# Patient Record
Sex: Male | Born: 1950
Health system: Southern US, Community
[De-identification: ages and names within clinical notes are randomized; demographics above are authoritative.]

## PROBLEM LIST (undated history)

## (undated) DIAGNOSIS — I444 Left anterior fascicular block: Secondary | ICD-10-CM

## (undated) DIAGNOSIS — H8110 Benign paroxysmal vertigo, unspecified ear: Secondary | ICD-10-CM

## (undated) DIAGNOSIS — Z85820 Personal history of malignant melanoma of skin: Secondary | ICD-10-CM

## (undated) DIAGNOSIS — M1990 Unspecified osteoarthritis, unspecified site: Secondary | ICD-10-CM

## (undated) DIAGNOSIS — Z87442 Personal history of urinary calculi: Secondary | ICD-10-CM

## (undated) DIAGNOSIS — E785 Hyperlipidemia, unspecified: Secondary | ICD-10-CM

## (undated) DIAGNOSIS — R7303 Prediabetes: Secondary | ICD-10-CM

## (undated) DIAGNOSIS — I10 Essential (primary) hypertension: Secondary | ICD-10-CM

## (undated) DIAGNOSIS — E119 Type 2 diabetes mellitus without complications: Secondary | ICD-10-CM

## (undated) DIAGNOSIS — K219 Gastro-esophageal reflux disease without esophagitis: Secondary | ICD-10-CM

## (undated) DIAGNOSIS — Z9889 Other specified postprocedural states: Secondary | ICD-10-CM

## (undated) DIAGNOSIS — N4232 Atypical small acinar proliferation of prostate: Secondary | ICD-10-CM

## (undated) DIAGNOSIS — Z8719 Personal history of other diseases of the digestive system: Secondary | ICD-10-CM

## (undated) DIAGNOSIS — R972 Elevated prostate specific antigen [PSA]: Secondary | ICD-10-CM

## (undated) DIAGNOSIS — Z85828 Personal history of other malignant neoplasm of skin: Secondary | ICD-10-CM

## (undated) HISTORY — DX: Essential (primary) hypertension: I10

## (undated) HISTORY — PX: LUMBAR DISC SURGERY: SHX700

---

## 1969-05-31 HISTORY — PX: GASTRIC FUNDOPLICATION: SHX226

## 1998-09-17 ENCOUNTER — Emergency Department (HOSPITAL_COMMUNITY): Admission: EM | Admit: 1998-09-17 | Discharge: 1998-09-17 | Payer: Self-pay | Admitting: Emergency Medicine

## 1998-09-17 ENCOUNTER — Encounter: Payer: Self-pay | Admitting: Emergency Medicine

## 1998-09-28 ENCOUNTER — Ambulatory Visit (HOSPITAL_COMMUNITY): Admission: RE | Admit: 1998-09-28 | Discharge: 1998-09-28 | Payer: Self-pay | Admitting: Gastroenterology

## 1998-09-28 ENCOUNTER — Encounter: Payer: Self-pay | Admitting: Gastroenterology

## 1998-11-30 ENCOUNTER — Ambulatory Visit (HOSPITAL_COMMUNITY): Admission: RE | Admit: 1998-11-30 | Discharge: 1998-11-30 | Payer: Self-pay

## 1999-06-14 ENCOUNTER — Encounter: Payer: Self-pay | Admitting: Family Medicine

## 1999-06-14 ENCOUNTER — Ambulatory Visit (HOSPITAL_COMMUNITY): Admission: RE | Admit: 1999-06-14 | Discharge: 1999-06-14 | Payer: Self-pay | Admitting: Family Medicine

## 1999-10-05 ENCOUNTER — Emergency Department (HOSPITAL_COMMUNITY): Admission: EM | Admit: 1999-10-05 | Discharge: 1999-10-05 | Payer: Self-pay | Admitting: *Deleted

## 1999-10-05 ENCOUNTER — Encounter: Payer: Self-pay | Admitting: *Deleted

## 2000-05-18 ENCOUNTER — Emergency Department (HOSPITAL_COMMUNITY): Admission: EM | Admit: 2000-05-18 | Discharge: 2000-05-18 | Payer: Self-pay | Admitting: Emergency Medicine

## 2000-05-18 ENCOUNTER — Encounter: Payer: Self-pay | Admitting: Emergency Medicine

## 2000-07-09 ENCOUNTER — Emergency Department (HOSPITAL_COMMUNITY): Admission: EM | Admit: 2000-07-09 | Discharge: 2000-07-10 | Payer: Self-pay | Admitting: Emergency Medicine

## 2000-07-09 ENCOUNTER — Encounter: Payer: Self-pay | Admitting: Emergency Medicine

## 2000-07-23 ENCOUNTER — Ambulatory Visit (HOSPITAL_COMMUNITY): Admission: RE | Admit: 2000-07-23 | Discharge: 2000-07-23 | Payer: Self-pay | Admitting: Gastroenterology

## 2000-07-23 ENCOUNTER — Encounter (INDEPENDENT_AMBULATORY_CARE_PROVIDER_SITE_OTHER): Payer: Self-pay | Admitting: Specialist

## 2000-08-18 ENCOUNTER — Encounter: Payer: Self-pay | Admitting: Surgery

## 2000-08-18 ENCOUNTER — Ambulatory Visit (HOSPITAL_COMMUNITY): Admission: RE | Admit: 2000-08-18 | Discharge: 2000-08-18 | Payer: Self-pay | Admitting: Surgery

## 2001-05-28 ENCOUNTER — Other Ambulatory Visit: Admission: RE | Admit: 2001-05-28 | Discharge: 2001-05-28 | Payer: Self-pay | Admitting: Dermatology

## 2002-10-27 ENCOUNTER — Ambulatory Visit (HOSPITAL_COMMUNITY): Admission: RE | Admit: 2002-10-27 | Discharge: 2002-10-27 | Payer: Self-pay | Admitting: Gastroenterology

## 2003-06-20 ENCOUNTER — Ambulatory Visit (HOSPITAL_COMMUNITY): Admission: RE | Admit: 2003-06-20 | Discharge: 2003-06-20 | Payer: Self-pay | Admitting: Family Medicine

## 2003-06-20 ENCOUNTER — Encounter: Payer: Self-pay | Admitting: Family Medicine

## 2005-04-15 ENCOUNTER — Other Ambulatory Visit: Admission: RE | Admit: 2005-04-15 | Discharge: 2005-04-15 | Payer: Self-pay | Admitting: Dermatology

## 2005-07-25 ENCOUNTER — Emergency Department (HOSPITAL_COMMUNITY): Admission: EM | Admit: 2005-07-25 | Discharge: 2005-07-25 | Payer: Self-pay | Admitting: Emergency Medicine

## 2006-09-12 ENCOUNTER — Emergency Department (HOSPITAL_COMMUNITY): Admission: EM | Admit: 2006-09-12 | Discharge: 2006-09-12 | Payer: Self-pay | Admitting: Emergency Medicine

## 2006-09-13 ENCOUNTER — Emergency Department (HOSPITAL_COMMUNITY): Admission: EM | Admit: 2006-09-13 | Discharge: 2006-09-13 | Payer: Self-pay | Admitting: Emergency Medicine

## 2007-02-09 ENCOUNTER — Ambulatory Visit: Payer: Self-pay | Admitting: Vascular Surgery

## 2007-03-23 ENCOUNTER — Encounter: Admission: RE | Admit: 2007-03-23 | Discharge: 2007-06-21 | Payer: Self-pay | Admitting: Neurology

## 2008-08-29 ENCOUNTER — Inpatient Hospital Stay (HOSPITAL_COMMUNITY): Admission: EM | Admit: 2008-08-29 | Discharge: 2008-08-31 | Payer: Self-pay | Admitting: Emergency Medicine

## 2008-08-29 ENCOUNTER — Ambulatory Visit (HOSPITAL_COMMUNITY): Admission: RE | Admit: 2008-08-29 | Discharge: 2008-08-29 | Payer: Self-pay | Admitting: Family Medicine

## 2009-12-11 ENCOUNTER — Emergency Department (HOSPITAL_COMMUNITY): Admission: EM | Admit: 2009-12-11 | Discharge: 2009-12-11 | Payer: Self-pay | Admitting: Emergency Medicine

## 2010-11-21 LAB — FECAL OCCULT BLOOD, GUAIAC: Fecal Occult Blood: NEGATIVE

## 2010-12-29 ENCOUNTER — Encounter: Payer: Self-pay | Admitting: Family Medicine

## 2010-12-29 DIAGNOSIS — E291 Testicular hypofunction: Secondary | ICD-10-CM | POA: Insufficient documentation

## 2010-12-29 DIAGNOSIS — I10 Essential (primary) hypertension: Secondary | ICD-10-CM | POA: Insufficient documentation

## 2010-12-29 DIAGNOSIS — E785 Hyperlipidemia, unspecified: Secondary | ICD-10-CM

## 2010-12-29 DIAGNOSIS — IMO0001 Reserved for inherently not codable concepts without codable children: Secondary | ICD-10-CM | POA: Insufficient documentation

## 2010-12-29 DIAGNOSIS — R42 Dizziness and giddiness: Secondary | ICD-10-CM | POA: Insufficient documentation

## 2010-12-29 DIAGNOSIS — E782 Mixed hyperlipidemia: Secondary | ICD-10-CM | POA: Insufficient documentation

## 2011-02-12 NOTE — Consult Note (Signed)
NAMEBENCE, TRAPP                 ACCOUNT NO.:  [id number]   MEDICAL RECORD NO.:  [id number]          PATIENT TYPE:  INP   LOCATION:  5120                         FACILITY:  [facility]   PHYSICIAN:  Cherylynn Ridges, M.D.    DATE OF BIRTH:  [birth year]   DATE OF CONSULTATION:  08/30/2008  DATE OF DISCHARGE:                                 CONSULTATION   Dear [name] primary care physician:   Thank you for asking me to see [name].  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  He has a history of a hiatal  hernia repair 30 years ago.  He has been unable to vomit since that  time, but he did have severe nausea associated with pain and tenderness  in the right upper quadrant all of which is resolved currently.  The  patient had an NG tube placed on admission, which drained a little-to-no  fluid, but most of his pain resulting before the NG tube was placed with  passing a multiple amounts of flatus.   A CT scan was done on admission, which shows some dilated small bowel  loops, possible transition zone near the terminal ileum.  No definitive  obstruction was noted.  A partial obstruction was called.  Surgical  consultation was requested.   His past medical history is significant for:  1. Hypertension.  2. Hyperlipidemia.  3. History of renal calculi.   PAST SURGICAL HISTORY:  He has had a hiatal hernia repair before in the  past.  He has no history of coronary artery disease, diabetes, pulmonary  disease, liver disease, or renal dysfunction.  He has no thyroid  dysfunction that we know of.   Family history is noncontributory.   Current medications include Nexium, Norvasc, Toprol-XL, fish oil,  Crestor, aspirin, Benicar, hydrochlorothiazide.   ALLERGIES:  He has no known drug allergies.   PHYSICAL EXAMINATION:  GENERAL:   He is afebrile.  He looks uncomfortable  with the NG tube in place, which is not draining anything; but  otherwise, he is not in any distress.  VITAL SIGNS:  His pulse is 75, blood pressure 131/83.  HEENT:  He is normocephalic and atraumatic and anicteric.  NECK:  Supple.  No carotid bruits.  No palpable cervical adenopathy.  CHEST:  Clear to auscultation.  CARDIAC:  Regular rhythm and rate, may be a slight murmur at the left  lower sternal border, but this may be just a functional murmur.  ABDOMEN:  Soft and very hairy.  He has a midline incision from his old  previous hiatal hernia repair 30 years ago.  No areas of tenderness,  actually excellent bowel sounds which appear to be normal.  RECTAL:  Not performed.  NEUROLOGICAL:  Cranial nerves II through XII are grossly intact.  Mental  status wise, he is intact with normal mentation, good judgment.  He does  not appear to be in distress.  LABORATORY STUDIES:  His white count is normal, hemoglobin of 14.7,  potassium is 3.3.  LFTs were all normal.   IMPRESSION:  Because the patient did have some right upper quadrant  tenderness along with epigastric tenderness, it is possibility that he  does have gallbladder disease, and by history, he has not had his  appendix nor his gallbladder removed.   The possibility of partial small bowel obstruction does exist; however,  it is very ethical.  Based on the CT scan and based on the fact that he  has had minimal-to-no NG tube output and he has been passing flatus, I  will discontinue the NG tube.  In addition to that, I think I will get  an ultrasound of the abdomen just to rule out gallstones that is one of  the possibilities for his current pain.  The patient did state he has a  strong right upper quadrant tenderness initially but that has resolved.  So the plan is discontinue NG tube, let him have ice chips, get him an  abdominal ultrasound and if that is negative, then start the patient  on  clear liquids and hopefully advance him quickly and discharge him in 24-  48 hours.      Cherylynn Ridges, M.D.  Electronically Signed     JOW/MEDQ  D:  08/30/2008  T:  08/30/2008  Job:  [id number]   cc:   [facility]

## 2011-02-12 NOTE — H&P (Signed)
[name], [name]                 ACCOUNT NO.:  [id number]   MEDICAL RECORD NO.:  [id number]          PATIENT TYPE:  INP   LOCATION:  5120                         FACILITY:  [facility]   PHYSICIAN:  Ladell Pier, M.D.   DATE OF BIRTH:  1951-01-23   DATE OF ADMISSION:  08/29/2008  DATE OF DISCHARGE:                              HISTORY & PHYSICAL   CHIEF COMPLAINT:  Abdominal pain and nausea   HISTORY OF PRESENT ILLNESS:  The patient is a 60 year old white male  that presented to the emergency room complaining of abdominal pain since  4:00 a.m. Sunday night.  The patient stated that he was not able to go  to work today because the pain was so severe with nausea.  He did not  have any vomiting, but he has not thrown up in a year since he has had  surgery for hiatal hernia.  He recently saw Dr. Christell Constant and had a checkup  and everything was okay.  He states that he has had small bowel  obstruction about 8-10 years ago that resolved on its own.  He has no  other complaints other than the abdominal pain and nausea.  He states  that this has eased off some now since he has been in the emergency room  and has the NG tube.   PAST MEDICAL HISTORY:  1. Dyslipidemia.  2. GERD.  3. Hiatal hernia.  4. Hypertension.  5. History of kidney stone.  6. History of back surgery x3.  7. Small bowel obstruction 8-10 years ago.   FAMILY HISTORY:  Noncontributory.   SOCIAL HISTORY:  He does not smoke.  He drinks alcohol socially.  He is  married.  He has 2 children.  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   MEDICATIONS:  1. Nexium 40 mg daily.  2. Norvasc 5 mg daily.  3. Toprol XL 100 mg daily.  4. Crestor 10 mg daily.  5. Fish oil 1000 daily.  6. Vitamin D 1000 international units daily.  7. Aspirin 81 mg daily.  8. Benicar HCTZ daily.   ALLERGIES:  PENICILLIN, MORPHINE.   REVIEW OF SYSTEMS:  Negative, otherwise stated in the HPI.   PHYSICAL EXAMINATION:  Temperature 98, blood  pressure 157/92, pulse of  81, respirations 20, pulse oximetry 94% on room air.  GENERAL: Patient is sitting up on the stretcher; does not seem to be in  any acute distress.  HEENT: Head is normocephalic, atraumatic.  Pupils equal, round and  reactive to light.  Throat without erythema.  CARDIOVASCULAR:  Regular rate and rhythm.  LUNGS:  Clear bilaterally.  ABDOMEN: Soft, nontender; with some tinkling bowel sounds.  EXTREMITIES: Without edema.   LABS:  Urinalysis negative.  Sodium 137, potassium 3.3, chloride 99, CO2  29, glucose 124, BUN 15, creatinine 0.85.  Alkaline phosphatase 95, AST  20, ALT 25.  WBC 9.7, hemoglobin 14.7, platelets 192, MCV 91.6.   CT scan of the abdomen and pelvis with partial small bowel obstruction,  severe degenerative changes throughout the visualized lower thoracic and  lumbar; spinal  stenoses suspected at L3-L4, L4-L5.  No mass identified.  This presumably is secondary to an adhesion.  Moderate prostate  enlargement, particularly at the median lobe.   ASSESSMENT/PLAN:  1. Partial small bowel obstruction.  Question secondary to adhesions.  2. GERD.  3. Dyslipidemia.  4. Hypertension.  5. Hypokalemia.   Will admit the patient to the hospital, start him on IV fluids.  He is  n.p.o. NG tube to oral suction.  General surgery consulted.  Will  continue him on his oral medications      Ladell Pier, M.D.  Electronically Signed     NJ/MEDQ  D:  08/29/2008  T:  08/29/2008  Job:  [id number]   cc:   Dr. Christell Constant

## 2011-02-15 NOTE — Procedures (Signed)
[facility]  Patient:    [name], [name]                        MRN: [id number] Proc. Date: 07/23/00 Adm. Date:  [id number] Disc. Date: [id number] Attending:  Osvaldo Human CC:         Monica Becton, M.D.  Cecil Cranker, M.D. [facility]   Procedure Report  PROCEDURE:  Esophagogastroduodenoscopy with biopsy.  INDICATIONS FOR PROCEDURE:  A patient with increasing reflux status post hiatal hernia repair requesting EGD per Dr. Christell Constant who discussed the case with Dr. Randa Evens in my absence. To me it sounds more like his old recurrence of his gas and bloat and not so much of the reflux. Consent was signed risks, benefits, methods, and options were thoroughly discussed with [name] multiple times in the past.  MEDICINES USED:  Demerol 70, Versed 6.  DESCRIPTION OF PROCEDURE:  Video endoscope was inserted by direct vision. A quick look at the vocal cords and posterior pharynx were normal. The scope was inserted through a normal esophagus into the stomach. The previous hiatal hernia wrap was confirmed. The scope was advanced through the antrum pertinent for some mild antritis and some tiny antral erosions and through a normal pylorus into a normal duodenal bulb and around the C loop to a normal second portion of the duodenum. The scope was withdrawn back to the bulb and a good look their ruled out ulcers in that location. The scope was withdrawn back to the stomach retroflexed. The angularis was normal. High in the cardia, the previous surgical changes were confirmed.  The fundus, lesser and greater curve were normal in retroflexed visualization. The scope was straightened and straight visualization of the stomach confirmed the antritis and ruled out any other abnormalities. The scope was advanced to the antrum, multiple biopsies were obtained and a few biopsies in the proximal stomach were also obtained to rule out any microscopic abnormality like  Helicobacter. Air was suctioned. Again a good look at the esophagus on slow withdrawal confirmed its normal appearance without any signs of Barretts or esophagitis. The scope was removed. The patient tolerated the procedure well and there was no obvious or immediate complication.  ENDOSCOPIC DIAGNOSIS: 1. Status post hiatal hernia wrap surgery. 2. Antritis and antral erosions status post biopsy. 3. Otherwise normal esophagogastroduodenoscopy.  PLAN:  Await pathology, treat H. pylori if positive. Trial of antispasmodics. Will try him on Robinul. Will need to get his records from [facility] ER to include x-rays and labs as well as Dr. Buel Ream recent labs and otherwise follow-up in 6-8 weeks to recheck symptoms and decide any other workup and plans. He is due for a colonoscopy next year, we may need to do that sooner or continue workup with things like gastric emptying, repeat ultrasound, small bowel follow-through, CT, etc. DD:  07/23/00 TD:  07/23/00 Job: [id number]

## 2011-02-15 NOTE — Op Note (Signed)
   NAME:  [name], [name]                           ACCOUNT NO.:  [id number]   MEDICAL RECORD NO.:  [id number]                   PATIENT TYPE:  AMB   LOCATION:  ENDO                                 FACILITY:  [facility]   PHYSICIAN:  Petra Kuba, M.D.                 DATE OF BIRTH:  [birth year]   DATE OF PROCEDURE:  10/27/2002  DATE OF DISCHARGE:                                 OPERATIVE REPORT   PROCEDURE PERFORMED:  Colonoscopy.   INDICATIONS FOR PROCEDURE:  Family history of colon cancer.  Personal  history of colon polyps due for repeat screening.   Consent was signed after the risks, benefits, and operative options were  thoroughly discussed in the office.   MEDICINES USED:  Demerol 100, Versed 10.   DESCRIPTION OF PROCEDURE:  Rectal inspection was pertinent for external  hemorrhoids.  Small digital exam was negative.  The video colonoscope was  inserted, easily advanced around the colon to the cecum.  This did require  rolling him on his back and some abdominal pressure.  On insertion, no  abnormalities were seen.  The cecum was identified by the appendiceal  orifice and the ileocecal valve.  The scope was slowly withdrawn.  The prep  was adequate.  There was some liquid stool that required washing and suction  on slow withdrawal through the colon.  There were no polyps, lesions, masses  or diverticula seen as we slowly withdrew back to the rectum.  Once back in  the rectum, the scope was retroflexed, pertinent for some small internal  hemorrhoids.  The scope was straightened and re-advanced a ways up the left  side of the colon.  The air was suctioned, the scope removed.  The patient  tolerated the procedure well.  There were no obvious immediate  complications.   DIAGNOSIS:  1. Internal and external hemorrhoids.  2. Otherwise normal mucosa to the cecum.   PLAN:  I would be happy to see him back p.r.n. otherwise return to the care  of Dr. Christell Constant for further continuing coverage  his health care management to  include yearly rectals and guaiacs and otherwise repeat screening in five  years.                                                Petra Kuba, M.D.    MEM/MEDQ  D:  10/27/2002  T:  10/27/2002  Job:  [id number]   cc:   Ernestina Penna, M.D.  [address] [place]  [place] [address]  Fax: [phone]

## 2011-07-02 LAB — URINALYSIS, ROUTINE W REFLEX MICROSCOPIC
Bilirubin Urine: NEGATIVE
Glucose, UA: NEGATIVE mg/dL
Hgb urine dipstick: NEGATIVE
Ketones, ur: 15 mg/dL — AB
Nitrite: NEGATIVE
Protein, ur: NEGATIVE mg/dL
Specific Gravity, Urine: >1.046 — ABNORMAL HIGH (ref 1.005–1.030)
Urobilinogen, UA: 0.2 mg/dL (ref 0.0–1.0)
pH: 6.5 (ref 5.0–8.0)

## 2011-07-02 LAB — COMPREHENSIVE METABOLIC PANEL
ALT: 25 U/L (ref 0–53)
AST: 20 U/L (ref 0–37)
Albumin: 3.7 g/dL (ref 3.5–5.2)
Alkaline Phosphatase: 95 U/L (ref 39–117)
BUN: 15 mg/dL (ref 6–23)
CO2: 29 mEq/L (ref 19–32)
Calcium: 9.6 mg/dL (ref 8.4–10.5)
Chloride: 99 mEq/L (ref 96–112)
Creatinine, Ser: 0.85 mg/dL (ref 0.4–1.5)
GFR calc Af Amer: >60 mL/min (ref >60)
GFR calc non Af Amer: >60 mL/min (ref >60)
Glucose, Bld: 124 mg/dL — ABNORMAL HIGH (ref 70–99)
Potassium: 3.3 mEq/L — ABNORMAL LOW (ref 3.5–5.1)
Sodium: 137 mEq/L (ref 135–145)
Total Bilirubin: 1 mg/dL (ref 0.3–1.2)
Total Protein: 7.1 g/dL (ref 6.0–8.3)

## 2011-07-02 LAB — DIFFERENTIAL
Basophils Absolute: 0 10*3/uL (ref 0.0–0.1)
Basophils Relative: 0 % (ref 0–1)
Eosinophils Absolute: 0 10*3/uL (ref 0.0–0.7)
Eosinophils Relative: 0 % (ref 0–5)
Lymphocytes Relative: 12 % (ref 12–46)
Lymphs Abs: 1.2 10*3/uL (ref 0.7–4.0)
Monocytes Absolute: 0.4 10*3/uL (ref 0.1–1.0)
Monocytes Relative: 4 % (ref 3–12)
Neutro Abs: 8.1 10*3/uL — ABNORMAL HIGH (ref 1.7–7.7)
Neutrophils Relative %: 83 % — ABNORMAL HIGH (ref 43–77)

## 2011-07-02 LAB — CBC
HCT: 43.6 % (ref 39.0–52.0)
Hemoglobin: 14.7 g/dL (ref 13.0–17.0)
MCHC: 33.7 g/dL (ref 30.0–36.0)
MCV: 91.6 fL (ref 78.0–100.0)
Platelets: 192 10*3/uL (ref 150–400)
RBC: 4.76 MIL/uL (ref 4.22–5.81)
RDW: 13.2 % (ref 11.5–15.5)
WBC: 9.7 10*3/uL (ref 4.0–10.5)

## 2011-07-05 LAB — BASIC METABOLIC PANEL
BUN: 13 mg/dL (ref 6–23)
CO2: 28 mEq/L (ref 19–32)
Calcium: 9.3 mg/dL (ref 8.4–10.5)
Chloride: 102 mEq/L (ref 96–112)
Creatinine, Ser: 0.86 mg/dL (ref 0.4–1.5)
GFR calc Af Amer: >60 mL/min (ref >60)
GFR calc non Af Amer: >60 mL/min (ref >60)
Glucose, Bld: 116 mg/dL — ABNORMAL HIGH (ref 70–99)
Potassium: 3.5 mEq/L (ref 3.5–5.1)
Sodium: 138 mEq/L (ref 135–145)

## 2011-07-05 LAB — CBC
HCT: 42 % (ref 39.0–52.0)
Hemoglobin: 14.5 g/dL (ref 13.0–17.0)
MCHC: 34.5 g/dL (ref 30.0–36.0)
MCV: 90.8 fL (ref 78.0–100.0)
Platelets: 169 10*3/uL (ref 150–400)
RBC: 4.62 MIL/uL (ref 4.22–5.81)
RDW: 13.1 % (ref 11.5–15.5)
WBC: 8.1 10*3/uL (ref 4.0–10.5)

## 2012-03-22 ENCOUNTER — Encounter (HOSPITAL_COMMUNITY): Payer: Self-pay | Admitting: Emergency Medicine

## 2012-03-22 ENCOUNTER — Inpatient Hospital Stay (HOSPITAL_COMMUNITY)
Admission: EM | Admit: 2012-03-22 | Discharge: 2012-03-24 | DRG: 180 | Disposition: A | Discharge status: Home / Self Care | Payer: BC Managed Care – PPO | Source: Ambulatory Visit | Attending: Internal Medicine | Admitting: Internal Medicine

## 2012-03-22 ENCOUNTER — Emergency Department (HOSPITAL_COMMUNITY): Payer: BC Managed Care – PPO

## 2012-03-22 DIAGNOSIS — E782 Mixed hyperlipidemia: Secondary | ICD-10-CM | POA: Diagnosis present

## 2012-03-22 DIAGNOSIS — Z7982 Long term (current) use of aspirin: Secondary | ICD-10-CM

## 2012-03-22 DIAGNOSIS — K219 Gastro-esophageal reflux disease without esophagitis: Secondary | ICD-10-CM | POA: Diagnosis present

## 2012-03-22 DIAGNOSIS — R42 Dizziness and giddiness: Secondary | ICD-10-CM | POA: Diagnosis present

## 2012-03-22 DIAGNOSIS — E876 Hypokalemia: Secondary | ICD-10-CM | POA: Diagnosis present

## 2012-03-22 DIAGNOSIS — K56609 Unspecified intestinal obstruction, unspecified as to partial versus complete obstruction: Secondary | ICD-10-CM

## 2012-03-22 DIAGNOSIS — K56600 Partial intestinal obstruction, unspecified as to cause: Secondary | ICD-10-CM | POA: Diagnosis present

## 2012-03-22 DIAGNOSIS — K565 Intestinal adhesions [bands], unspecified as to partial versus complete obstruction: Principal | ICD-10-CM | POA: Diagnosis present

## 2012-03-22 DIAGNOSIS — I10 Essential (primary) hypertension: Secondary | ICD-10-CM | POA: Diagnosis present

## 2012-03-22 DIAGNOSIS — E785 Hyperlipidemia, unspecified: Secondary | ICD-10-CM | POA: Diagnosis present

## 2012-03-22 DIAGNOSIS — Z79899 Other long term (current) drug therapy: Secondary | ICD-10-CM

## 2012-03-22 LAB — CBC
HCT: 44.3 % (ref 39.0–52.0)
Hemoglobin: 15.5 g/dL (ref 13.0–17.0)
MCH: 30.6 pg (ref 26.0–34.0)
MCV: 87.5 fL (ref 78.0–100.0)
Platelets: 190 10*3/uL (ref 150–400)
RBC: 5.06 MIL/uL (ref 4.22–5.81)
RDW: 13 % (ref 11.5–15.5)
WBC: 10 10*3/uL (ref 4.0–10.5)

## 2012-03-22 LAB — COMPREHENSIVE METABOLIC PANEL
ALT: 21 U/L (ref 0–53)
AST: 17 U/L (ref 0–37)
Albumin: 4.4 g/dL (ref 3.5–5.2)
Alkaline Phosphatase: 114 U/L (ref 39–117)
BUN: 20 mg/dL (ref 6–23)
CO2: 27 mEq/L (ref 19–32)
Chloride: 101 mEq/L (ref 96–112)
Creatinine, Ser: 0.92 mg/dL (ref 0.50–1.35)
GFR calc Af Amer: >90 mL/min (ref >90)
GFR calc non Af Amer: 89 mL/min — ABNORMAL LOW (ref >90)
Glucose, Bld: 107 mg/dL — ABNORMAL HIGH (ref 70–99)
Potassium: 3.4 mEq/L — ABNORMAL LOW (ref 3.5–5.1)
Total Bilirubin: 0.6 mg/dL (ref 0.3–1.2)

## 2012-03-22 LAB — LACTIC ACID, PLASMA: Lactic Acid, Venous: 1.2 mmol/L (ref 0.5–2.2)

## 2012-03-22 LAB — URINALYSIS, ROUTINE W REFLEX MICROSCOPIC
Bilirubin Urine: NEGATIVE
Glucose, UA: NEGATIVE mg/dL
Hgb urine dipstick: NEGATIVE
Specific Gravity, Urine: 1.027 (ref 1.005–1.030)
pH: 5.5 (ref 5.0–8.0)

## 2012-03-22 LAB — LIPASE, BLOOD: Lipase: 16 U/L (ref 11–59)

## 2012-03-22 LAB — TROPONIN I: Troponin I: <0.3 ng/mL (ref <0.30)

## 2012-03-22 MED ORDER — MORPHINE SULFATE 4 MG/ML IJ SOLN
4.0000 mg | Freq: Once | INTRAMUSCULAR | Status: AC
  Administered 2012-03-22: 4 mg via INTRAVENOUS
  Filled 2012-03-22: qty 1

## 2012-03-22 MED ORDER — ONDANSETRON HCL 4 MG/2ML IJ SOLN
4.0000 mg | Freq: Once | INTRAMUSCULAR | Status: AC
  Administered 2012-03-22: 4 mg via INTRAVENOUS
  Filled 2012-03-22: qty 2

## 2012-03-22 MED ORDER — IOHEXOL 300 MG/ML  SOLN
20.0000 mL | INTRAMUSCULAR | Status: AC
  Administered 2012-03-22: 20 mL via ORAL

## 2012-03-22 MED ORDER — SODIUM CHLORIDE 0.9 % IV BOLUS (SEPSIS)
500.0000 mL | Freq: Once | INTRAVENOUS | Status: AC
  Administered 2012-03-22: 500 mL via INTRAVENOUS

## 2012-03-22 MED ORDER — IOHEXOL 300 MG/ML  SOLN
100.0000 mL | Freq: Once | INTRAMUSCULAR | Status: AC | PRN
  Administered 2012-03-22: 100 mL via INTRAVENOUS

## 2012-03-22 NOTE — ED Notes (Addendum)
C/o constant abd pain and nausea since 2am.  Normal BM today.  States when he pushes on RUQ pain shoots across abd.

## 2012-03-22 NOTE — Consult Note (Signed)
Reason for Consult:[id number] Referring Physician: Dr Warden Fillers is an 61 y.o. male.  ZOX:WRUE is a 61 year old Caucasian male WHO DEVELOPED UPPER ABDOMINAL DISCOMFORT AROUND 2 AM. Initially the pain was intermittent however today the pain became more steady and constant. He describes the pain as like a pressure pushing out. He still had a large amount of flatus today. He has also been burping quite a lot as well. He reports having 2 normal bowel movements today. He is also had a little bit of nausea as well. He had a similar episode like this in November 2009. He was admitted to the hospital and treated non-operatively for a mild case of partial small bowel obstruction. He reports having a hiatal hernia repair more than 30 years ago. He denies any weight loss. He denies any melena, hematochezia, jaundice, NSAID use. He had a normal upper endoscopy and colonoscopy about 2 months ago. The pain is mainly in his upper abdomen and on the right side. He denies any fevers or chills. He states that he has not been able to vomit ever since his hiatal hernia repair.  Past Medical History  Diagnosis Date  . Essential hypertension, benign   . Other and unspecified hyperlipidemia   . Reflux   . Vertigo, intermittent   . Other testicular hypofunction   . Acid reflux     Past Surgical History  Procedure Date  . Hiatal hernia repair   . Lumbar disc surgery     No family history on file.  Social History:  reports that he has never smoked. He does not have any smokeless tobacco history on file. He reports that he drinks alcohol. He reports that he does not use illicit drugs.  Allergies:  Allergies  Allergen Reactions  . Penicillins     Medications: I have reviewed the patient's current medications.  Results for orders placed during the hospital encounter of 03/22/12 (from the past 48 hour(s))  CBC     Status: Normal   Collection Time   03/22/12  5:20 PM      Component Value Range Comment     WBC 10.0  4.0 - 10.5 K/uL    RBC 5.06  4.22 - 5.81 MIL/uL    Hemoglobin 15.5  13.0 - 17.0 g/dL    HCT 45.4  09.8 - 11.9 %    MCV 87.5  78.0 - 100.0 fL    MCH 30.6  26.0 - 34.0 pg    MCHC 35.0  30.0 - 36.0 g/dL    RDW 14.7  82.9 - 56.2 %    Platelets 190  150 - 400 K/uL   COMPREHENSIVE METABOLIC PANEL     Status: Abnormal   Collection Time   03/22/12  5:20 PM      Component Value Range Comment   Sodium 139  135 - 145 mEq/L    Potassium 3.4 (*) 3.5 - 5.1 mEq/L    Chloride 101  96 - 112 mEq/L    CO2 27  19 - 32 mEq/L    Glucose, Bld 107 (*) 70 - 99 mg/dL    BUN 20  6 - 23 mg/dL    Creatinine, Ser 1.30  0.50 - 1.35 mg/dL    Calcium 86.5  8.4 - 10.5 mg/dL    Total Protein 7.8  6.0 - 8.3 g/dL    Albumin 4.4  3.5 - 5.2 g/dL    AST 17  0 - 37 U/L    ALT 21  0 - 53 U/L    Alkaline Phosphatase 114  39 - 117 U/L    Total Bilirubin 0.6  0.3 - 1.2 mg/dL    GFR calc non Af Amer 89 (*) >90 mL/min    GFR calc Af Amer >90  >90 mL/min   LIPASE, BLOOD     Status: Normal   Collection Time   03/22/12  5:20 PM      Component Value Range Comment   Lipase 16  11 - 59 U/L   LACTIC ACID, PLASMA     Status: Normal   Collection Time   03/22/12  7:00 PM      Component Value Range Comment   Lactic Acid, Venous 1.2  0.5 - 2.2 mmol/L   URINALYSIS, ROUTINE W REFLEX MICROSCOPIC     Status: Abnormal   Collection Time   03/22/12  7:11 PM      Component Value Range Comment   Color, Urine YELLOW  YELLOW    APPearance CLEAR  CLEAR    Specific Gravity, Urine 1.027  1.005 - 1.030    pH 5.5  5.0 - 8.0    Glucose, UA NEGATIVE  NEGATIVE mg/dL    Hgb urine dipstick NEGATIVE  NEGATIVE    Bilirubin Urine NEGATIVE  NEGATIVE    Ketones, ur 15 (*) NEGATIVE mg/dL    Protein, ur NEGATIVE  NEGATIVE mg/dL    Urobilinogen, UA 0.2  0.0 - 1.0 mg/dL    Nitrite NEGATIVE  NEGATIVE    Leukocytes, UA NEGATIVE  NEGATIVE MICROSCOPIC NOT DONE ON URINES WITH NEGATIVE PROTEIN, BLOOD, LEUKOCYTES, NITRITE, OR GLUCOSE <1000 mg/dL.   TROPONIN I     Status: Normal   Collection Time   03/22/12  7:50 PM      Component Value Range Comment   Troponin I <0.30  <0.30 ng/mL     Ct Abdomen Pelvis W Contrast  03/22/2012  *RADIOLOGY REPORT*  Clinical Data: Abdominal pain.  CT ABDOMEN AND PELVIS WITH CONTRAST  Technique:  Multidetector CT imaging of the abdomen and pelvis was performed following the standard protocol during bolus administration of intravenous contrast.  Contrast: OMNIPAQUE IOHEXOL 300 MG/ML  SOLN  Comparison: 08/29/2008.  Findings: The lung bases are clear.  Minimal dependent bibasilar atelectasis.  The liver is unremarkable.  No focal lesions or intrahepatic biliary dilatation.  The gallbladder is normal.  No common bile duct dilatation.  The pancreas is normal.  The spleen is normal in size.  No focal lesions.  The adrenal glands and kidneys are normal except for small bilateral renal calculi.  The stomach is unremarkable.  The duodenum is normal.  There are dilated small bowel loops particularly in the pelvis which are fluid filled with scattered air fluid levels.  The terminal ileum and distal ileum are normal in caliber.  There is mild enhancement and wall thickening in the distal loop of ileum which could reflect local inflammation or the sequela of an adhesion.  No mass.  No free air or free fluid.  There is a small amount of free fluid in the pelvis.  The aorta is normal in caliber.  Minimal scattered atherosclerotic changes.  The major branch vessels are patent.  No mesenteric or retroperitoneal mass or adenopathy.  The prostate gland is enlarged.  The bladder is normal.  No pelvic mass or adenopathy.  There is a small amount of free pelvic fluid. No inguinal mass or adenopathy.  Small right inguinal hernia containing fat.  The bony  structures are unremarkable.  There is a stable sclerotic lesion in the right iliac bone.  IMPRESSION:  1.  Findings consistent with an early small bowel obstruction either due to local  small bowel inflammation or adhesion in the distal ileum. 2.  No other significant abdominal/pelvic findings.  Original Report Authenticated By: P. Loralie Champagne, M.D.    Review of Systems  Constitutional: Negative for fever, chills and diaphoresis.  HENT: Negative for nosebleeds and congestion.   Eyes: Negative for blurred vision and double vision.  Respiratory: Negative for shortness of breath.   Cardiovascular: Negative for chest pain, palpitations, orthopnea, leg swelling and PND.  Gastrointestinal: Positive for nausea and abdominal pain. Negative for vomiting, diarrhea, constipation, blood in stool and melena.       Recent normal EGD and colonoscopy in 12/2011  Genitourinary: Negative for dysuria and urgency.  Musculoskeletal: Negative for myalgias.  Neurological: Negative for dizziness, focal weakness, seizures, loss of consciousness, weakness and headaches.       Denies TIA/amarosis fugax  Psychiatric/Behavioral: Negative for substance abuse.   Blood pressure 134/83, pulse 66, temperature 97.8 F (36.6 C), temperature source Oral, resp. rate 19, SpO2 96.00%. Physical Exam  Vitals reviewed. Constitutional: He is oriented to person, place, and time. He appears well-developed and well-nourished. No distress.  HENT:  Head: Normocephalic and atraumatic.  Right Ear: External ear normal.  Left Ear: External ear normal.  Eyes: Conjunctivae are normal. No scleral icterus.  Neck: Normal range of motion. Neck supple. No tracheal deviation present. No thyromegaly present.  Cardiovascular: Normal rate, regular rhythm, normal heart sounds and intact distal pulses.   Respiratory: Effort normal and breath sounds normal. No stridor. No respiratory distress. He has no wheezes.  GI: Soft. Bowel sounds are normal. There is tenderness in the right upper quadrant, epigastric area and periumbilical area. There is no rigidity, no rebound and no guarding.         Obese; perhaps mild distension. No  tympany  Musculoskeletal: Normal range of motion. He exhibits no edema and no tenderness.  Neurological: He is alert and oriented to person, place, and time. He exhibits normal muscle tone.  Skin: Skin is warm and dry. No rash noted. He is not diaphoretic. No erythema.  Psychiatric: He has a normal mood and affect. His behavior is normal. Judgment and thought content normal.    Assessment/Plan: HTN HPL GERD hypokalemia Epigastric pain with mild partial small bowel obstruction  Clinically, his history and exam and CT scan is very similar to his presentation in November 2009. Most likely this is due to an adhesion. I believe the probability of this being inflammatory bowel disease is very low. He could have a mild case of enteritis causing a reactive ileus. The more than likely this is consistent with an adhesive band causing a partial blockage. At this time, I would recommend holding off on nasogastric tube placement. However if the patient complains of worsening pain or worsening nausea overnight or becomes more distended, then he needs to have a nasogastric tube placed for decompression  Recommend-bowel rest, n.p.o., IV fluid resuscitation, replacement of potassium. I would also repeat a set of plain abdominal films in the morning to monitor his bowel gas pattern.  Mary Sella. Andrey Campanile, MD, FACS General, Bariatric, & Minimally Invasive Surgery [facility]   We will follow in consultation.  Gaynelle Adu M 03/22/2012, 9:56 PM

## 2012-03-22 NOTE — ED Provider Notes (Signed)
History     CSN: [id number]  Arrival date & time 03/22/12  1648   First MD Initiated Contact with Patient 03/22/12 1825      Chief Complaint  Patient presents with  . Abdominal Pain     Patient is a 60 y.o. male presenting with abdominal pain. The history is provided by the patient.  Abdominal Pain The primary symptoms of the illness include abdominal pain and nausea. The primary symptoms of the illness do not include fever, shortness of breath, vomiting, diarrhea or dysuria. The current episode started 13 to 24 hours ago. The onset of the illness was gradual. The problem has been gradually worsening.  The patient has not had a change in bowel habit. Symptoms associated with the illness do not include chills, constipation or back pain.  pt reports he woke up with diffuse abdominal pain, nausea.  He reports he has not had this pain before  Denies cp/sob Denies fever Denies dysuria Denies LE weakness No back pain Distant h/o hiatal hernia repair The pain does not radiate into chest He denies constipation He denies rectal bleeding  Past Medical History  Diagnosis Date  . Essential hypertension, benign   . Other and unspecified hyperlipidemia   . Reflux   . Vertigo, intermittent   . Other testicular hypofunction   . Acid reflux     Past Surgical History  Procedure Date  . Hiatal hernia repair   . Lumbar disc surgery     No family history on file.  History  Substance Use Topics  . Smoking status: Never Smoker   . Smokeless tobacco: Not on file  . Alcohol Use: Yes      Review of Systems  Constitutional: Negative for fever and chills.  Respiratory: Negative for shortness of breath.   Gastrointestinal: Positive for nausea and abdominal pain. Negative for vomiting, diarrhea and constipation.  Genitourinary: Negative for dysuria.  Musculoskeletal: Negative for back pain.  All other systems reviewed and are negative.    Allergies  Penicillins  Home  Medications   Current Outpatient Rx  Name Route Sig Dispense Refill  . AMLODIPINE BESYLATE 2.5 MG PO TABS Oral Take 2.5 mg by mouth daily.      . ASPIRIN 81 MG PO TBEC Oral Take 81 mg by mouth daily.      Marland Kitchen VITAMIN D 2000 UNITS PO CAPS Oral Take 1 capsule by mouth daily.      Marland Kitchen ESOMEPRAZOLE MAGNESIUM 40 MG PO CPDR Oral Take 40 mg by mouth daily as needed.      Marland Kitchen METOPROLOL SUCCINATE ER 100 MG PO TB24 Oral Take 100 mg by mouth daily.      Marland Kitchen OLMESARTAN MEDOXOMIL-HCTZ 40-12.5 MG PO TABS Oral Take 1 tablet by mouth daily.      Marland Kitchen FISH OIL 1000 MG PO CAPS Oral Take 1 capsule by mouth daily.      Marland Kitchen ROSUVASTATIN CALCIUM 20 MG PO TABS Oral Take 20 mg by mouth daily.    . TESTOSTERONE 20.25 MG/ACT (1.62%) TD GEL Transdermal Place 3 application onto the skin daily.        BP 134/83  Pulse 66  Temp 97.8 F (36.6 C) (Oral)  Resp 19  SpO2 96%  Physical Exam CONSTITUTIONAL: Well developed/well nourished HEAD AND FACE: Normocephalic/atraumatic EYES: EOMI/PERRL ENMT: Mucous membranes moist NECK: supple no meningeal signs SPINE:entire spine nontender CV: S1/S2 noted, no murmurs/rubs/gallops noted LUNGS: Lungs are clear to auscultation bilaterally, no apparent distress ABDOMEN: soft,diffuse tenderness noted  and tenderness is moderate no rebound or guarding GU:no cva tenderness, no hernia, no testicular tenderness noted, chaperone present NEURO: Pt is awake/alert, moves all extremitiesx4 EXTREMITIES: pulses normal, full ROM SKIN: warm, color normal PSYCH: no abnormalities of mood noted  ED Course  Procedures   Labs Reviewed  COMPREHENSIVE METABOLIC PANEL - Abnormal; Notable for the following:    Potassium 3.4 (*)     Glucose, Bld 107 (*)     GFR calc non Af Amer 89 (*)     All other components within normal limits  CBC  URINALYSIS, ROUTINE W REFLEX MICROSCOPIC  LIPASE, BLOOD   7:17 PM Pt with diffuse abd tenderness will require CT imaging 9:00 PM Ct results noted, d/w dr Gaynelle Adu, surgery, will see patient Patient updated on plan  10:08 PM Seen by dr Andrey Campanile, he reviewed imaging, feels patient can be admitted to medicine, no NG tube for now D/w dr Toniann Fail to admit to medicine, and aware not to place NG tube  MDM  Nursing notes including past medical history and social history reviewed and considered in documentation All labs/vitals reviewed and considered        Date: 03/22/2012  Rate: 70  Rhythm: normal sinus rhythm  QRS Axis: normal  Intervals: normal  ST/T Wave abnormalities: inverted t waves inferiorly  Conduction Disutrbances:none No significant change from prior    Joya Gaskins, MD 03/22/12 2208

## 2012-03-22 NOTE — ED Notes (Signed)
Oral contrast intake complete x 3 glasses

## 2012-03-22 NOTE — H&P (Signed)
[name] is an 61 y.o. male.   PCP - Dr Christell Constant at [facility]. Chief Complaint: Abdominal pain. HPI: 61 year-old male who has had previous small bowel obstruction in 2009 and at that time was treated conservatively presents with abdominal pain. The pain started off this morning 2 AM. Initially it was coming and going and eventually became constant. The pain is pressure-like as if something is blowing. Patient has had 2 bowel movements last one was at around 11 AM. Denies any vomiting, fever chills. In the ER patient had a CT abdomen and pelvis which shows features of small bowel obstruction. Patient has been only seen by surgery and they have recommended conservative management at this time. Patient will be admitted for further management.   Past Medical History  Diagnosis Date  . Essential hypertension, benign   . Other and unspecified hyperlipidemia   . Reflux   . Vertigo, intermittent   . Other testicular hypofunction   . Acid reflux     Past Surgical History  Procedure Date  . Hiatal hernia repair   . Lumbar disc surgery     History reviewed. No pertinent family history. Social History:  reports that he has never smoked. He does not have any smokeless tobacco history on file. He reports that he drinks alcohol. He reports that he does not use illicit drugs.  Allergies:  Allergies  Allergen Reactions  . Penicillins      (Not in a hospital admission)  Results for orders placed during the hospital encounter of 03/22/12 (from the past 48 hour(s))  CBC     Status: Normal   Collection Time   03/22/12  5:20 PM      Component Value Range Comment   WBC 10.0  4.0 - 10.5 K/uL    RBC 5.06  4.22 - 5.81 MIL/uL    Hemoglobin 15.5  13.0 - 17.0 g/dL    HCT 16.1  09.6 - 04.5 %    MCV 87.5  78.0 - 100.0 fL    MCH 30.6  26.0 - 34.0 pg    MCHC 35.0  30.0 - 36.0 g/dL    RDW 40.9  81.1 - 91.4 %    Platelets 190  150 - 400 K/uL   COMPREHENSIVE METABOLIC PANEL      Status: Abnormal   Collection Time   03/22/12  5:20 PM      Component Value Range Comment   Sodium 139  135 - 145 mEq/L    Potassium 3.4 (*) 3.5 - 5.1 mEq/L    Chloride 101  96 - 112 mEq/L    CO2 27  19 - 32 mEq/L    Glucose, Bld 107 (*) 70 - 99 mg/dL    BUN 20  6 - 23 mg/dL    Creatinine, Ser 7.82  0.50 - 1.35 mg/dL    Calcium 95.6  8.4 - 10.5 mg/dL    Total Protein 7.8  6.0 - 8.3 g/dL    Albumin 4.4  3.5 - 5.2 g/dL    AST 17  0 - 37 U/L    ALT 21  0 - 53 U/L    Alkaline Phosphatase 114  39 - 117 U/L    Total Bilirubin 0.6  0.3 - 1.2 mg/dL    GFR calc non Af Amer 89 (*) >90 mL/min    GFR calc Af Amer >90  >90 mL/min   LIPASE, BLOOD     Status: Normal   Collection  Time   03/22/12  5:20 PM      Component Value Range Comment   Lipase 16  11 - 59 U/L   LACTIC ACID, PLASMA     Status: Normal   Collection Time   03/22/12  7:00 PM      Component Value Range Comment   Lactic Acid, Venous 1.2  0.5 - 2.2 mmol/L   URINALYSIS, ROUTINE W REFLEX MICROSCOPIC     Status: Abnormal   Collection Time   03/22/12  7:11 PM      Component Value Range Comment   Color, Urine YELLOW  YELLOW    APPearance CLEAR  CLEAR    Specific Gravity, Urine 1.027  1.005 - 1.030    pH 5.5  5.0 - 8.0    Glucose, UA NEGATIVE  NEGATIVE mg/dL    Hgb urine dipstick NEGATIVE  NEGATIVE    Bilirubin Urine NEGATIVE  NEGATIVE    Ketones, ur 15 (*) NEGATIVE mg/dL    Protein, ur NEGATIVE  NEGATIVE mg/dL    Urobilinogen, UA 0.2  0.0 - 1.0 mg/dL    Nitrite NEGATIVE  NEGATIVE    Leukocytes, UA NEGATIVE  NEGATIVE MICROSCOPIC NOT DONE ON URINES WITH NEGATIVE PROTEIN, BLOOD, LEUKOCYTES, NITRITE, OR GLUCOSE <1000 mg/dL.  TROPONIN I     Status: Normal   Collection Time   03/22/12  7:50 PM      Component Value Range Comment   Troponin I <0.30  <0.30 ng/mL    Ct Abdomen Pelvis W Contrast  03/22/2012  *RADIOLOGY REPORT*  Clinical Data: Abdominal pain.  CT ABDOMEN AND PELVIS WITH CONTRAST  Technique:  Multidetector CT imaging of  the abdomen and pelvis was performed following the standard protocol during bolus administration of intravenous contrast.  Contrast: OMNIPAQUE IOHEXOL 300 MG/ML  SOLN  Comparison: 08/29/2008.  Findings: The lung bases are clear.  Minimal dependent bibasilar atelectasis.  The liver is unremarkable.  No focal lesions or intrahepatic biliary dilatation.  The gallbladder is normal.  No common bile duct dilatation.  The pancreas is normal.  The spleen is normal in size.  No focal lesions.  The adrenal glands and kidneys are normal except for small bilateral renal calculi.  The stomach is unremarkable.  The duodenum is normal.  There are dilated small bowel loops particularly in the pelvis which are fluid filled with scattered air fluid levels.  The terminal ileum and distal ileum are normal in caliber.  There is mild enhancement and wall thickening in the distal loop of ileum which could reflect local inflammation or the sequela of an adhesion.  No mass.  No free air or free fluid.  There is a small amount of free fluid in the pelvis.  The aorta is normal in caliber.  Minimal scattered atherosclerotic changes.  The major branch vessels are patent.  No mesenteric or retroperitoneal mass or adenopathy.  The prostate gland is enlarged.  The bladder is normal.  No pelvic mass or adenopathy.  There is a small amount of free pelvic fluid. No inguinal mass or adenopathy.  Small right inguinal hernia containing fat.  The bony structures are unremarkable.  There is a stable sclerotic lesion in the right iliac bone.  IMPRESSION:  1.  Findings consistent with an early small bowel obstruction either due to local small bowel inflammation or adhesion in the distal ileum. 2.  No other significant abdominal/pelvic findings.  Original Report Authenticated By: P. Loralie Champagne, M.D.    Review of Systems  Constitutional: Negative.   HENT: Negative.   Eyes: Negative.   Respiratory: Negative.   Cardiovascular: Negative.     Gastrointestinal: Positive for abdominal pain.  Genitourinary: Negative.   Musculoskeletal: Negative.   Skin: Negative.   Neurological: Negative.   Endo/Heme/Allergies: Negative.   Psychiatric/Behavioral: Negative.     Blood pressure 137/82, pulse 69, temperature 97.8 F (36.6 C), temperature source Oral, resp. rate 19, SpO2 95.00%. Physical Exam  Constitutional: He is oriented to person, place, and time. He appears well-developed and well-nourished. No distress.  HENT:  Head: Normocephalic and atraumatic.  Right Ear: External ear normal.  Left Ear: External ear normal.  Nose: Nose normal.  Mouth/Throat: Oropharynx is clear and moist. No oropharyngeal exudate.  Eyes: Conjunctivae are normal. Pupils are equal, round, and reactive to light. Right eye exhibits no discharge. Left eye exhibits no discharge. No scleral icterus.  Neck: Normal range of motion. Neck supple.  Cardiovascular: Normal rate and regular rhythm.   Respiratory: Effort normal and breath sounds normal. No respiratory distress. He has no wheezes. He has no rales.  GI: Soft. He exhibits distension (MILD DISTENSION.). There is no tenderness. There is no rebound and no guarding.  Musculoskeletal: Normal range of motion. He exhibits no edema and no tenderness.  Neurological: He is alert and oriented to person, place, and time.       Moves all extremities.  Skin: Skin is warm and dry. No rash noted. He is not diaphoretic. No erythema.  Psychiatric: His behavior is normal.     Assessment/Plan #1. Small bowel obstruction most likely from adhesions - patient will be kept n.p.o. IV fluids and pain relief medications. Repeat KUB in a.m. Further recommendations per surgery. #2. Hypertension - since patient is n.p.o. we will keep patient on when necessary IV hydralazine for systolic blood pressure more than 160.\ #3. History of hiatal hernia surgery. #4. Recent colonoscopy and EGD. #5. History of hyperlipidemia.  CODE STATUS  - full code.  Eduard Clos. 03/22/2012, 11:19 PM

## 2012-03-23 ENCOUNTER — Inpatient Hospital Stay (HOSPITAL_COMMUNITY): Payer: BC Managed Care – PPO

## 2012-03-23 DIAGNOSIS — I10 Essential (primary) hypertension: Secondary | ICD-10-CM

## 2012-03-23 DIAGNOSIS — K56609 Unspecified intestinal obstruction, unspecified as to partial versus complete obstruction: Secondary | ICD-10-CM

## 2012-03-23 DIAGNOSIS — E782 Mixed hyperlipidemia: Secondary | ICD-10-CM

## 2012-03-23 LAB — GLUCOSE, CAPILLARY
Glucose-Capillary: 122 mg/dL — ABNORMAL HIGH (ref 70–99)
Glucose-Capillary: 104 mg/dL — ABNORMAL HIGH (ref 70–99)
Glucose-Capillary: 125 mg/dL — ABNORMAL HIGH (ref 70–99)
Glucose-Capillary: 97 mg/dL (ref 70–99)

## 2012-03-23 LAB — CBC
HCT: 44.9 % (ref 39.0–52.0)
Hemoglobin: 15.7 g/dL (ref 13.0–17.0)
WBC: 9.6 10*3/uL (ref 4.0–10.5)

## 2012-03-23 LAB — COMPREHENSIVE METABOLIC PANEL
BUN: 18 mg/dL (ref 6–23)
Calcium: 9.3 mg/dL (ref 8.4–10.5)
GFR calc Af Amer: >90 mL/min (ref >90)
Glucose, Bld: 112 mg/dL — ABNORMAL HIGH (ref 70–99)
Total Protein: 7.5 g/dL (ref 6.0–8.3)

## 2012-03-23 MED ORDER — BISACODYL 10 MG RE SUPP: 10.0000 mg | Freq: Once | RECTAL | Status: DC

## 2012-03-23 MED ORDER — ACETAMINOPHEN 650 MG RE SUPP: 650.0000 mg | Freq: Four times a day (QID) | RECTAL | Status: DC | PRN

## 2012-03-23 MED ORDER — POTASSIUM CHLORIDE IN NACL 20-0.9 MEQ/L-% IV SOLN
INTRAVENOUS | Status: DC
  Administered 2012-03-23: 02:00:00 via INTRAVENOUS
  Filled 2012-03-23 (×3): qty 1000

## 2012-03-23 MED ORDER — PANTOPRAZOLE SODIUM 40 MG IV SOLR
40.0000 mg | INTRAVENOUS | Status: DC
  Administered 2012-03-23 – 2012-03-24 (×2): 40 mg via INTRAVENOUS
  Filled 2012-03-23 (×4): qty 40

## 2012-03-23 MED ORDER — POTASSIUM CHLORIDE 20 MEQ/15ML (10%) PO LIQD
40.0000 meq | Freq: Once | ORAL | Status: AC
  Administered 2012-03-23: 40 meq via ORAL
  Filled 2012-03-23: qty 30

## 2012-03-23 MED ORDER — SODIUM CHLORIDE 0.9 % IV SOLN
INTRAVENOUS | Status: DC
  Administered 2012-03-23 – 2012-03-24 (×3): via INTRAVENOUS
  Filled 2012-03-23 (×4): qty 1000

## 2012-03-23 MED ORDER — ACETAMINOPHEN 325 MG PO TABS: 650.0000 mg | ORAL_TABLET | Freq: Four times a day (QID) | ORAL | Status: DC | PRN

## 2012-03-23 MED ORDER — ONDANSETRON HCL 4 MG/2ML IJ SOLN
4.0000 mg | Freq: Four times a day (QID) | INTRAMUSCULAR | Status: DC | PRN
  Administered 2012-03-23: 4 mg via INTRAVENOUS
  Filled 2012-03-23: qty 2

## 2012-03-23 MED ORDER — ATORVASTATIN CALCIUM 40 MG PO TABS
40.0000 mg | ORAL_TABLET | Freq: Every day | ORAL | Status: DC
  Administered 2012-03-23: 40 mg via ORAL
  Filled 2012-03-23 (×2): qty 1

## 2012-03-23 MED ORDER — ONDANSETRON HCL 4 MG PO TABS: 4.0000 mg | ORAL_TABLET | Freq: Four times a day (QID) | ORAL | Status: DC | PRN

## 2012-03-23 MED ORDER — MORPHINE SULFATE 2 MG/ML IJ SOLN
1.0000 mg | INTRAMUSCULAR | Status: DC | PRN
  Administered 2012-03-23: 1 mg via INTRAVENOUS
  Filled 2012-03-23: qty 1

## 2012-03-23 MED ORDER — AMLODIPINE BESYLATE 2.5 MG PO TABS
2.5000 mg | ORAL_TABLET | Freq: Every day | ORAL | Status: DC
  Administered 2012-03-23 – 2012-03-24 (×2): 2.5 mg via ORAL
  Filled 2012-03-23 (×2): qty 1

## 2012-03-23 MED ORDER — HYDRALAZINE HCL 20 MG/ML IJ SOLN
10.0000 mg | INTRAMUSCULAR | Status: DC | PRN
  Filled 2012-03-23: qty 0.5

## 2012-03-23 NOTE — Progress Notes (Signed)
Patient is sipping on clear liquids, ordered by TRH.  I told him to just sip on this until we are sure that his bowel function has returned.  Clinically, he seems to be resolving his SBO.  Much flatus.  No BM.  Will give Dulcolax supp x 1.  Follow films   Wilmon Arms. Corliss Skains, MD, [facility] Surgery  03/23/2012 9:14 AM

## 2012-03-23 NOTE — Progress Notes (Signed)
Patient ID: [name], male   DOB: 1951/03/14, 61 y.o.   MRN: [id number]    Subjective: Reports feeling much better today, abd pain resolved, passing lots of gas, denies BM yet but feel like he needs to, denies n/v  Objective: Vital signs in last 24 hours: Temp:  [97.8 F (36.6 C)-98.3 F (36.8 C)] 98.1 F (36.7 C) (06/24 0605) Pulse Rate:  [66-76] 68  (06/24 0605) Resp:  [16-19] 18  (06/24 0605) BP: (125-152)/(76-84) 126/76 mmHg (06/24 0605) SpO2:  [95 %-96 %] 95 % (06/24 0605) Weight:  [252 lb 3.3 oz (114.4 kg)] 252 lb 3.3 oz (114.4 kg) (06/24 0115) Last BM Date: 03/22/12  Intake/Output from previous day:   Intake/Output this shift:    PE: Abd: soft, nontender, +active BS  Lab Results:   Basename 03/22/12 1720  WBC 10.0  HGB 15.5  HCT 44.3  PLT 190   BMET  Basename 03/22/12 1720  NA 139  K 3.4*  CL 101  CO2 27  GLUCOSE 107*  BUN 20  CREATININE 0.92  CALCIUM 10.3   PT/INR No results found for this basename: LABPROT:2,INR:2 in the last 72 hours CMP     Component Value Date/Time   NA 139 03/22/2012 1720   K 3.4* 03/22/2012 1720   CL 101 03/22/2012 1720   CO2 27 03/22/2012 1720   GLUCOSE 107* 03/22/2012 1720   BUN 20 03/22/2012 1720   CREATININE 0.92 03/22/2012 1720   CALCIUM 10.3 03/22/2012 1720   PROT 7.8 03/22/2012 1720   ALBUMIN 4.4 03/22/2012 1720   AST 17 03/22/2012 1720   ALT 21 03/22/2012 1720   ALKPHOS 114 03/22/2012 1720   BILITOT 0.6 03/22/2012 1720   GFRNONAA 89* 03/22/2012 1720   GFRAA >90 03/22/2012 1720   Lipase     Component Value Date/Time   LIPASE 16 03/22/2012 1720       Studies/Results: Dg Abd 1 View  03/23/2012  *RADIOLOGY REPORT*  Clinical Data: Abdominal pain.  Nausea.  Partial small bowel obstruction.  ABDOMEN - 1 VIEW  Comparison: CT 03/22/2012  Findings: There is a similar pattern of gaseous distention of the small intestine.  Some gas does remain evident in the colon. Findings are most consistent with partial small bowel  obstruction, though ileus is possible.  No sign of free air on this supine image.  Iodinated contrast is present in the bladder.  IMPRESSION: Persistent small bowel gaseous distention, similar in comparison to the CT exam of yesterday.  Most consistent with partial small bowel obstruction.  Original Report Authenticated By: Thomasenia Sales, M.D.   Ct Abdomen Pelvis W Contrast  03/22/2012  *RADIOLOGY REPORT*  Clinical Data: Abdominal pain.  CT ABDOMEN AND PELVIS WITH CONTRAST  Technique:  Multidetector CT imaging of the abdomen and pelvis was performed following the standard protocol during bolus administration of intravenous contrast.  Contrast: OMNIPAQUE IOHEXOL 300 MG/ML  SOLN  Comparison: 08/29/2008.  Findings: The lung bases are clear.  Minimal dependent bibasilar atelectasis.  The liver is unremarkable.  No focal lesions or intrahepatic biliary dilatation.  The gallbladder is normal.  No common bile duct dilatation.  The pancreas is normal.  The spleen is normal in size.  No focal lesions.  The adrenal glands and kidneys are normal except for small bilateral renal calculi.  The stomach is unremarkable.  The duodenum is normal.  There are dilated small bowel loops particularly in the pelvis which are fluid filled with scattered air fluid  levels.  The terminal ileum and distal ileum are normal in caliber.  There is mild enhancement and wall thickening in the distal loop of ileum which could reflect local inflammation or the sequela of an adhesion.  No mass.  No free air or free fluid.  There is a small amount of free fluid in the pelvis.  The aorta is normal in caliber.  Minimal scattered atherosclerotic changes.  The major branch vessels are patent.  No mesenteric or retroperitoneal mass or adenopathy.  The prostate gland is enlarged.  The bladder is normal.  No pelvic mass or adenopathy.  There is a small amount of free pelvic fluid. No inguinal mass or adenopathy.  Small right inguinal hernia  containing fat.  The bony structures are unremarkable.  There is a stable sclerotic lesion in the right iliac bone.  IMPRESSION:  1.  Findings consistent with an early small bowel obstruction either due to local small bowel inflammation or adhesion in the distal ileum. 2.  No other significant abdominal/pelvic findings.  Original Report Authenticated By: P. Loralie Champagne, M.D.    Anti-infectives: Anti-infectives    None       Assessment/Plan  1.  PSBO: clinically better today, will repeat films in am, if no better then may need gastrograffin with SB follow through, keep NPO for now and on IVfs.   LOS: 1 day    Ethell Blatchford 03/23/2012

## 2012-03-23 NOTE — Progress Notes (Signed)
Patient ID: LATRAIL POUNDERS, male   DOB: [birth year], 61 y.o.   MRN: [id number] PATIENT DETAILS Name: [name] Age: 61 y.o. Sex: male Date of Birth: [birth year] Admit Date: 03/22/2012 PCP:No primary provider on file.  Interim History:   Subjective: Reports decreased pain, + flatus, 3 bowel movements today. Still with some nausea  Objective: Weight change:   Intake/Output Summary (Last 24 hours) at 03/23/12 1320 Last data filed at 03/23/12 0900  Gross per 24 hour  Intake      0 ml  Output      0 ml  Net      0 ml   Blood pressure 126/76, pulse 68, temperature 98.1 F (36.7 C), temperature source Oral, resp. rate 18, height 6' (1.829 m), weight 114.4 kg (252 lb 3.3 oz), SpO2 95.00%. Filed Vitals:   03/22/12 1850 03/22/12 2201 03/23/12 0115 03/23/12 0605  BP: 134/83 137/82 152/84 126/76  Pulse: 66 69 67 68  Temp: 97.8 F (36.6 C)  98.2 F (36.8 C) 98.1 F (36.7 C)  TempSrc: Oral  Oral Oral  Resp: 19  18 18   Height:   6' (1.829 m)   Weight:   114.4 kg (252 lb 3.3 oz)   SpO2: 96% 95% 95% 95%    Physical Exam: General: No acute distress, A&O, appears comfortable. Lungs: Clear to auscultation bilaterally without wheezes or crackles Cardiovascular: Regular rate and rhythm without murmur gallop or rub normal S1 and S2 Abdomen: Nontender, nondistended, soft, bowel sounds positive, no rebound, no ascites, no appreciable mass Extremities: No significant cyanosis, clubbing, or edema bilateral lower extremities  Basic Metabolic Panel:  Lab 03/23/12 3086 03/22/12 1720  NA 138 139  K 3.5 3.4*  CL 100 101  CO2 25 27  GLUCOSE 112* 107*  BUN 18 20  CREATININE 0.84 0.92  CALCIUM 9.3 10.3  MG -- --  PHOS -- --   Liver Function Tests:  Lab 03/23/12 0917 03/22/12 1720  AST 14 17  ALT 16 21  ALKPHOS 104 114  BILITOT 0.7 0.6  PROT 7.5 7.8  ALBUMIN 4.0 4.4    Lab 03/22/12 1720  LIPASE 16  AMYLASE --   No results found for this basename: AMMONIA:2 in the last 168  hours CBC:  Lab 03/23/12 0917 03/22/12 1720  WBC 9.6 10.0  NEUTROABS -- --  HGB 15.7 15.5  HCT 44.9 44.3  MCV 87.7 87.5  PLT 166 190   Cardiac Enzymes:  Lab 03/22/12 1950  CKTOTAL --  CKMB --  CKMBINDEX --  TROPONINI <0.30   CBG:  Lab 03/23/12 1218 03/23/12 0754 03/23/12 0359 03/23/12 0127  GLUCAP 125* 104* 122* 100*   Studies/Results:  03/23/12 abdominal xray IMPRESSION:  Persistent small bowel gaseous distention, similar in comparison to  the CT exam of yesterday. Most consistent with partial small bowel  obstruction.  Scheduled Meds:   . bisacodyl  10 mg Rectal Once  . iohexol  20 mL Oral Q1 Hr x 2  .  morphine injection  4 mg Intravenous Once  .  morphine injection  4 mg Intravenous Once  . ondansetron  4 mg Intravenous Once  . pantoprazole (PROTONIX) IV  40 mg Intravenous Q24H  . potassium chloride  40 mEq Oral Once  . sodium chloride  500 mL Intravenous Once   Continuous Infusions:   . sodium chloride 0.9 % 1,000 mL with potassium chloride 40 mEq infusion 100 mL/hr at 03/23/12 1114  . DISCONTD: 0.9 % NaCl with  KCl 20 mEq / L 100 mL/hr at 03/23/12 0146   PRN Meds:.acetaminophen, acetaminophen, hydrALAZINE, iohexol, morphine injection, ondansetron (ZOFRAN) IV, ondansetron  Anti-infectives:  Anti-infectives    None      Assessment/Plan: Principal Problem:  *SBO (small bowel obstruction) Active Problems:  Essential hypertension, benign  Other and unspecified hyperlipidemia  Small bowel obstruction Xray unchanged but clinically improved.  Will start clears. Ambulate patient.   Appreciate CCS recommendations.  Patient unable to vomit secondary to hiatal hernia surgery 10 years ago.  Hypokalemia Mild will replete with IV fluids and 40 po elixir.   Disposition:  To home when able to tolerate a diet. (likely 6/25)  DVT Prophylaxis:  SCDs.     LOS: 1 day      Stephani Police 03/23/2012, 1:20 PM [phone]  Attending I have seen and  examined the patient, I agree with the assessment and plan as outlined above. Patient abdomen exam is benign, he has now started to have BM's. Will advance diet slowly. If he continues to do well, then we maybe able to discharge him 6/25.  S Grisell Bissette

## 2012-03-24 ENCOUNTER — Inpatient Hospital Stay (HOSPITAL_COMMUNITY): Payer: BC Managed Care – PPO

## 2012-03-24 DIAGNOSIS — E782 Mixed hyperlipidemia: Secondary | ICD-10-CM

## 2012-03-24 DIAGNOSIS — I10 Essential (primary) hypertension: Secondary | ICD-10-CM

## 2012-03-24 DIAGNOSIS — K56609 Unspecified intestinal obstruction, unspecified as to partial versus complete obstruction: Secondary | ICD-10-CM

## 2012-03-24 LAB — BASIC METABOLIC PANEL
BUN: 13 mg/dL (ref 6–23)
GFR calc non Af Amer: >90 mL/min (ref >90)
Glucose, Bld: 121 mg/dL — ABNORMAL HIGH (ref 70–99)
Potassium: 4.2 mEq/L (ref 3.5–5.1)

## 2012-03-24 MED ORDER — PANTOPRAZOLE SODIUM 40 MG PO TBEC: 40.0000 mg | DELAYED_RELEASE_TABLET | Freq: Every day | ORAL | Status: DC

## 2012-03-24 MED ORDER — METOPROLOL SUCCINATE ER 50 MG PO TB24: 50.0000 mg | ORAL_TABLET | Freq: Every day | ORAL | Status: DC

## 2012-03-24 NOTE — Progress Notes (Signed)
Patient ID: [name], male   DOB: [birth year], 61 y.o.   MRN: [id number]     Subjective: Pt reports 6 BMs in last 24 hours.  Tolerating regular diet, feels fine, abd pain resolved, denies n/v  Objective: Vital signs in last 24 hours: Temp:  [97.4 F (36.3 C)-98.8 F (37.1 C)] 98.6 F (37 C) (06/25 0531) Pulse Rate:  [71-90] 90  (06/25 0531) Resp:  [16-18] 16  (06/25 0531) BP: (108-149)/(65-81) 108/67 mmHg (06/25 0531) SpO2:  [92 %-95 %] 93 % (06/25 0531) Last BM Date: 03/24/12  Intake/Output from previous day: 06/24 0701 - 06/25 0700 In: 821.7 [I.V.:821.7] Out: -  Intake/Output this shift:    PE: Abd: soft, nontender, +active BS  Lab Results:   [facility] 03/23/12 0917 03/22/12 1720  WBC 9.6 10.0  HGB 15.7 15.5  HCT 44.9 44.3  PLT 166 190   BMET  Basename 03/24/12 0520 03/23/12 0917  NA 138 138  K 4.2 3.5  CL 102 100  CO2 26 25  GLUCOSE 121* 112*  BUN 13 18  CREATININE 0.86 0.84  CALCIUM 9.1 9.3   PT/INR No results found for this basename: LABPROT:2,INR:2 in the last 72 hours CMP     Component Value Date/Time   NA 138 03/24/2012 0520   K 4.2 03/24/2012 0520   CL 102 03/24/2012 0520   CO2 26 03/24/2012 0520   GLUCOSE 121* 03/24/2012 0520   BUN 13 03/24/2012 0520   CREATININE 0.86 03/24/2012 0520   CALCIUM 9.1 03/24/2012 0520   PROT 7.5 03/23/2012 0917   ALBUMIN 4.0 03/23/2012 0917   AST 14 03/23/2012 0917   ALT 16 03/23/2012 0917   ALKPHOS 104 03/23/2012 0917   BILITOT 0.7 03/23/2012 0917   GFRNONAA >90 03/24/2012 0520   GFRAA >90 03/24/2012 0520   Lipase     Component Value Date/Time   LIPASE 16 03/22/2012 1720       Studies/Results: Dg Abd 1 View  03/24/2012  *RADIOLOGY REPORT*  Clinical Data: Follow up of small bowel obstruction.  ABDOMEN - 1 VIEW  Comparison: 03/23/2012  Findings: Single supine view of the abdomen and pelvis.  Improved gaseous distention of small bowel loops.  Mild small bowel dilatation remains, 3.8 cm.  Gas within normal caliber  colon, including distally.  No pneumatosis, free intraperitoneal air, or other acute complication.  Bilateral renal calculi were better seen on CT.  IMPRESSION: Improved to resolved small bowel obstruction.  Original Report Authenticated By: Consuello Bossier, M.D.   Dg Abd 1 View  03/23/2012  *RADIOLOGY REPORT*  Clinical Data: Abdominal pain.  Nausea.  Partial small bowel obstruction.  ABDOMEN - 1 VIEW  Comparison: CT 03/22/2012  Findings: There is a similar pattern of gaseous distention of the small intestine.  Some gas does remain evident in the colon. Findings are most consistent with partial small bowel obstruction, though ileus is possible.  No sign of free air on this supine image.  Iodinated contrast is present in the bladder.  IMPRESSION: Persistent small bowel gaseous distention, similar in comparison to the CT exam of yesterday.  Most consistent with partial small bowel obstruction.  Original Report Authenticated By: Thomasenia Sales, M.D.   Ct Abdomen Pelvis W Contrast  03/22/2012  *RADIOLOGY REPORT*  Clinical Data: Abdominal pain.  CT ABDOMEN AND PELVIS WITH CONTRAST  Technique:  Multidetector CT imaging of the abdomen and pelvis was performed following the standard protocol during bolus administration of intravenous contrast.  Contrast:  OMNIPAQUE IOHEXOL 300 MG/ML  SOLN  Comparison: 08/29/2008.  Findings: The lung bases are clear.  Minimal dependent bibasilar atelectasis.  The liver is unremarkable.  No focal lesions or intrahepatic biliary dilatation.  The gallbladder is normal.  No common bile duct dilatation.  The pancreas is normal.  The spleen is normal in size.  No focal lesions.  The adrenal glands and kidneys are normal except for small bilateral renal calculi.  The stomach is unremarkable.  The duodenum is normal.  There are dilated small bowel loops particularly in the pelvis which are fluid filled with scattered air fluid levels.  The terminal ileum and distal ileum are normal in  caliber.  There is mild enhancement and wall thickening in the distal loop of ileum which could reflect local inflammation or the sequela of an adhesion.  No mass.  No free air or free fluid.  There is a small amount of free fluid in the pelvis.  The aorta is normal in caliber.  Minimal scattered atherosclerotic changes.  The major branch vessels are patent.  No mesenteric or retroperitoneal mass or adenopathy.  The prostate gland is enlarged.  The bladder is normal.  No pelvic mass or adenopathy.  There is a small amount of free pelvic fluid. No inguinal mass or adenopathy.  Small right inguinal hernia containing fat.  The bony structures are unremarkable.  There is a stable sclerotic lesion in the right iliac bone.  IMPRESSION:  1.  Findings consistent with an early small bowel obstruction either due to local small bowel inflammation or adhesion in the distal ileum. 2.  No other significant abdominal/pelvic findings.  Original Report Authenticated By: P. Loralie Champagne, M.D.    Anti-infectives: Anti-infectives    None       Assessment/Plan  1.  PSBO: symptoms have completely resolved and patient tolerating regular diet, can be d/c'd home from surgical standpoint, follow up with [place] as needed.    LOS: 2 days    Cylus Douville 03/24/2012

## 2012-03-24 NOTE — Discharge Summary (Signed)
Patient ID: [name] MRN: [id number] DOB/AGE: 02/02/1951 61 y.o.  Admit date: 03/22/2012 Discharge date: 03/24/2012  Primary Care Physician:  Rudi Heap, MD  Discharge Diagnoses:   Present on Admission:  .SBO (small bowel obstruction) .Essential hypertension, benign .Other and unspecified hyperlipidemia    Medication List  As of 03/24/2012 10:17 AM   STOP taking these medications         aspirin 81 MG EC tablet      olmesartan-hydrochlorothiazide 40-12.5 MG per tablet         TAKE these medications         amLODipine 2.5 MG tablet   Commonly known as: NORVASC   Take 2.5 mg by mouth daily.      ANDROGEL PUMP 20.25 MG/ACT (1.62%) Gel   Generic drug: Testosterone   Place 3 application onto the skin daily.      esomeprazole 40 MG capsule   Commonly known as: NEXIUM   Take 40 mg by mouth daily as needed.      Fish Oil 1000 MG Caps   Take 1 capsule by mouth daily.      metoprolol succinate 50 MG 24 hr tablet   Commonly known as: TOPROL-XL   Take 1 tablet (50 mg total) by mouth daily. Take with or immediately following a meal.      rosuvastatin 20 MG tablet   Commonly known as: CRESTOR   Take 20 mg by mouth daily.      Vitamin D 2000 UNITS Caps   Take 1 capsule by mouth daily.            Consults:  [facility], Dr. Margaree Mackintosh  Brief H and P: From the admission note:  HPI: 61 year-old male who has had previous small bowel obstruction in 2009 and at that time was treated conservatively presents with abdominal pain. The pain started off this morning 2 AM. Initially it was coming and going and eventually became constant. The pain is pressure-like as if something is blowing. Patient has had 2 bowel movements last one was at around 11 AM. Denies any vomiting, fever chills. In the ER patient had a CT abdomen and pelvis which shows features of small bowel obstruction. Patient has been only seen by surgery and they have recommended conservative management at  this time.  Small Bowel Obstruction:  Likely etiology felt to be surgical adhesions. Fortunately Mr. Gerardo improved with conservative management.  He was given potassium supplementation, encouraged to ambulate, and given suppositories.  Clinically and as documented on xray he improved each day.  His diet was advanced over 24 hours.  This morning he tolerated a solid breakfast.  He has had multiple bowel movements and his abdomen is much less distended.  He requests discharge to home.  Hypertension Mr. Minasyan blood pressure was low on admission so his BP meds were held.  Surprisingly his blood pressure did not really increase over the course of his hospital stay.  His low dose amlodipine was restarted on 6/24.  We will restart Toprol xl 50 mg at discharge in anticipation of his blood pressure rising as he improved.  I have advised Mr. Manninen to check his blood pressure in the morning before taking his Toprol, and that if it is less than 110, not to take the Toprol.  His Olmesartan / HCTZ will not be restarted at discharge.  Mr. Burdin is to see Dr. Christell Constant possibly tomorrow, but certainly with in the week to manage his blood  pressure and medications.  Physical Exam on Discharge: General: Alert, awake, oriented x3, in no acute distress. Appears well. HEENT: No bruits, no goiter. Heart: Regular rate and rhythm, without murmurs, rubs, gallops. Lungs: Clear to auscultation bilaterally. Abdomen: Soft, nontender, nondistended, active bowel sounds. Extremities: No clubbing cyanosis or edema with positive pedal pulses. Neuro: Grossly intact, nonfocal.  Filed Vitals:   03/23/12 1450 03/23/12 2200 03/24/12 0531 03/24/12 0940  BP: 149/81 116/65 108/67 118/72  Pulse: 71 78 90   Temp: 97.4 F (36.3 C) 98.8 F (37.1 C) 98.6 F (37 C)   TempSrc: Oral Oral Oral   Resp: 18 18 16    Height:      Weight:      SpO2: 95% 92% 93%      Intake/Output Summary (Last 24 hours) at 03/24/12 1017 Last data filed at  03/24/12 0015  Gross per 24 hour  Intake 821.67 ml  Output      0 ml  Net 821.67 ml    Basic Metabolic Panel:  Lab 03/24/12 4098 03/23/12 0917  NA 138 138  K 4.2 3.5  CL 102 100  CO2 26 25  GLUCOSE 121* 112*  BUN 13 18  CREATININE 0.86 0.84  CALCIUM 9.1 9.3  MG -- --  PHOS -- --   Liver Function Tests:  Lab 03/23/12 0917 03/22/12 1720  AST 14 17  ALT 16 21  ALKPHOS 104 114  BILITOT 0.7 0.6  PROT 7.5 7.8  ALBUMIN 4.0 4.4    Lab 03/22/12 1720  LIPASE 16  AMYLASE --   CBC:  Lab 03/23/12 0917 03/22/12 1720  WBC 9.6 10.0  NEUTROABS -- --  HGB 15.7 15.5  HCT 44.9 44.3  MCV 87.7 87.5  PLT 166 190   Cardiac Enzymes:  Lab 03/22/12 1950  CKTOTAL --  CKMB --  CKMBINDEX --  TROPONINI <0.30   CBG:  Lab 03/23/12 2108 03/23/12 1705 03/23/12 1218 03/23/12 0754 03/23/12 0359 03/23/12 0127  GLUCAP 136* 97 125* 104* 122* 100*     Significant Diagnostic Studies:  Dg Abd 1 View  03/24/2012  *RADIOLOGY REPORT*  Clinical Data: Follow up of small bowel obstruction.  ABDOMEN - 1 VIEW  Comparison: 03/23/2012  Findings: Single supine view of the abdomen and pelvis.  Improved gaseous distention of small bowel loops.  Mild small bowel dilatation remains, 3.8 cm.  Gas within normal caliber colon, including distally.  No pneumatosis, free intraperitoneal air, or other acute complication.  Bilateral renal calculi were better seen on CT.  IMPRESSION: Improved to resolved small bowel obstruction.  Original Report Authenticated By: Consuello Bossier, M.D.   Dg Abd 1 View  03/23/2012  *RADIOLOGY REPORT*  Clinical Data: Abdominal pain.  Nausea.  Partial small bowel obstruction.  ABDOMEN - 1 VIEW  Comparison: CT 03/22/2012  Findings: There is a similar pattern of gaseous distention of the small intestine.  Some gas does remain evident in the colon. Findings are most consistent with partial small bowel obstruction, though ileus is possible.  No sign of free air on this supine image.   Iodinated contrast is present in the bladder.  IMPRESSION: Persistent small bowel gaseous distention, similar in comparison to the CT exam of yesterday.  Most consistent with partial small bowel obstruction.  Original Report Authenticated By: Thomasenia Sales, M.D.   Ct Abdomen Pelvis W Contrast  03/22/2012  *RADIOLOGY REPORT*  Clinical Data: Abdominal pain.  CT ABDOMEN AND PELVIS WITH CONTRAST  Technique:  Multidetector CT  imaging of the abdomen and pelvis was performed following the standard protocol during bolus administration of intravenous contrast.  Contrast: OMNIPAQUE IOHEXOL 300 MG/ML  SOLN  Comparison: 08/29/2008.  Findings: The lung bases are clear.  Minimal dependent bibasilar atelectasis.  The liver is unremarkable.  No focal lesions or intrahepatic biliary dilatation.  The gallbladder is normal.  No common bile duct dilatation.  The pancreas is normal.  The spleen is normal in size.  No focal lesions.  The adrenal glands and kidneys are normal except for small bilateral renal calculi.  The stomach is unremarkable.  The duodenum is normal.  There are dilated small bowel loops particularly in the pelvis which are fluid filled with scattered air fluid levels.  The terminal ileum and distal ileum are normal in caliber.  There is mild enhancement and wall thickening in the distal loop of ileum which could reflect local inflammation or the sequela of an adhesion.  No mass.  No free air or free fluid.  There is a small amount of free fluid in the pelvis.  The aorta is normal in caliber.  Minimal scattered atherosclerotic changes.  The major branch vessels are patent.  No mesenteric or retroperitoneal mass or adenopathy.  The prostate gland is enlarged.  The bladder is normal.  No pelvic mass or adenopathy.  There is a small amount of free pelvic fluid. No inguinal mass or adenopathy.  Small right inguinal hernia containing fat.  The bony structures are unremarkable.  There is a stable sclerotic lesion  in the right iliac bone.  IMPRESSION:  1.  Findings consistent with an early small bowel obstruction either due to local small bowel inflammation or adhesion in the distal ileum. 2.  No other significant abdominal/pelvic findings.  Original Report Authenticated By: P. Loralie Champagne, M.D.      Disposition and Follow-up: stable for discharge to home with Primary Care Physician follow up within the week.  Discharge Orders    Future Orders Please Complete By Expires   Diet - low sodium heart healthy      Increase activity slowly      Discharge instructions      Comments:   Monitor blood pressure.  BP medications will need to be added back as you improve and BP rises.   Call MD for:  severe uncontrolled pain      Comments:   And abdominal distension.     Follow-up Information    Follow up with Rudi Heap, MD. Schedule an appointment as soon as possible for a visit in 1 week. (See Dr. Christell Constant within one week to adjust blood pressure medications as necessary.)    Contact information:   [address] [place] [address] [phone]           Time spent on Discharge: 40 min.  SignedStephani Police 03/24/2012, 10:17 AM [phone]  Attending  I have seen and examined the patient, I agree with the assessment and plan as outlined above. He has had numerous BM's today, has tolerated a regular diet, he is stable to discharged home.  S Kristain Filo

## 2012-03-24 NOTE — Care Management Note (Signed)
    Page 1 of 1   03/24/2012     10:29:05 AM   CARE MANAGEMENT NOTE 03/24/2012  Patient:  [name], [name]   Account Number:  [id number]  Date Initiated:  03/24/2012  Documentation initiated by:  Letha Cape  Subjective/Objective Assessment:   dx sbo  admit- lives with spouse.  pta independent.     Action/Plan:   Anticipated DC Date:  03/24/2012   Anticipated DC Plan:  HOME/SELF CARE      DC Planning Services  CM consult      Choice offered to / List presented to:             Status of service:  Completed, signed off Medicare Important Message given?   (If response is "NO", the following Medicare IM given date fields will be blank) Date Medicare IM given:   Date Additional Medicare IM given:    Discharge Disposition:  HOME/SELF CARE  Per UR Regulation:  Reviewed for med. necessity/level of care/duration of stay  If discussed at Long Length of Stay Meetings, dates discussed:    Comments:  03/24/12 10:26 Letha Cape RN, BSN [phone] patient lives with spouse, pta independent.  Patient has medication coverage and transportation.  No needs anticipated.

## 2012-03-24 NOTE — Progress Notes (Signed)
Phineas Real to be D/C'd Home per MD order.  Discussed with the patient and all questions fully answered.   Costantino, [place]  Home Medication Instructions ONG:[id number]   Printed on:03/24/12 1016  Medication Information                    amLODipine (NORVASC) 2.5 MG tablet Take 2.5 mg by mouth daily.             esomeprazole (NEXIUM) 40 MG capsule Take 40 mg by mouth daily as needed.             Omega-3 Fatty Acids (FISH OIL) 1000 MG CAPS Take 1 capsule by mouth daily.             Cholecalciferol (VITAMIN D) 2000 UNITS CAPS Take 1 capsule by mouth daily.             Testosterone (ANDROGEL PUMP) 20.25 MG/ACT (1.62%) GEL Place 3 application onto the skin daily.             rosuvastatin (CRESTOR) 20 MG tablet Take 20 mg by mouth daily.           metoprolol succinate (TOPROL XL) 50 MG 24 hr tablet Take 1 tablet (50 mg total) by mouth daily. Take with or immediately following a meal.             VVS, Skin clean, dry and intact without evidence of skin break down, no evidence of skin tears noted. IV catheter discontinued intact. Site without signs and symptoms of complications. Dressing and pressure applied.  An After Visit Summary was printed and given to the patient. Patient escorted via WC, and D/C home via private auto.  Kennyth Arnold D 03/24/2012 10:16 AM

## 2012-03-24 NOTE — Progress Notes (Signed)
SBO resolved Tolerating diet OK for discharge.  Wilmon Arms. Corliss Skains, MD, [facility]  03/24/2012 8:54 AM

## 2012-03-24 NOTE — Discharge Instructions (Signed)
Recommended for good bowel health:  1.  Daily walking or aerobic exercise  2.  Drink plenty of fluids (water is best)  3.  Keep stools soft.  Have a bowel movement every day or every other day.  Avoid constipation with regular stool softeners as needed (recommend:  Miralax / glycolax powder in 4 ounces of liquid.  available over the counter)

## 2013-06-08 ENCOUNTER — Ambulatory Visit (INDEPENDENT_AMBULATORY_CARE_PROVIDER_SITE_OTHER): Payer: BC Managed Care – PPO | Admitting: Family Medicine

## 2013-06-08 ENCOUNTER — Encounter: Payer: Self-pay | Admitting: Family Medicine

## 2013-06-08 VITALS — BP 149/85 | HR 59 | Temp 98.6°F | Ht 72.0 in | Wt 242.0 lb

## 2013-06-08 DIAGNOSIS — I10 Essential (primary) hypertension: Secondary | ICD-10-CM

## 2013-06-08 DIAGNOSIS — Z0000 Encounter for general adult medical examination without abnormal findings: Secondary | ICD-10-CM

## 2013-06-08 DIAGNOSIS — E785 Hyperlipidemia, unspecified: Secondary | ICD-10-CM

## 2013-06-08 DIAGNOSIS — Z125 Encounter for screening for malignant neoplasm of prostate: Secondary | ICD-10-CM

## 2013-06-08 LAB — POCT CBC
Granulocyte percent: 64.3 %G (ref 37–80)
Hemoglobin: 15.3 g/dL (ref 14.1–18.1)
MCHC: 33 g/dL (ref 31.8–35.4)
MPV: 7.4 fL (ref 0–99.8)
POC Granulocyte: 3.9 (ref 2–6.9)
POC LYMPH PERCENT: 28.3 %L (ref 10–50)
RDW, POC: 12.9 %

## 2013-06-08 NOTE — Patient Instructions (Signed)
Continue aggressive therapeutic lifestyle changes Always drink plenty of fluids Don't put yourself at risk for falling We will look at doing a chest x-ray and cardiogram next spring Don't forget to get your flu shot in October

## 2013-06-08 NOTE — Addendum Note (Signed)
Addended by: Tommas Olp on: 06/08/2013 12:24 PM   Modules accepted: Orders

## 2013-06-08 NOTE — Progress Notes (Signed)
  Subjective:    Patient ID: [name], male    DOB: Apr 13, 1951, 62 y.o.   MRN: [id number]  HPI Patient comes in today for his annual physical exam. He has a history of hyperlipidemia, hypertension, decreased testicular function, and GERD .    Review of Systems  Constitutional: Negative.   HENT: Negative.   Eyes: Negative.   Respiratory: Negative.   Cardiovascular: Negative.   Gastrointestinal: Negative.   Endocrine: Negative.   Genitourinary: Negative.   Musculoskeletal: Positive for arthralgias.  Skin: Negative.   Allergic/Immunologic: Negative.   Neurological: Negative.   Hematological: Negative.   Psychiatric/Behavioral: Negative.    As of note home blood pressures run 120 to 1:30 over the 80s    Objective:   Physical Exam BP 149/85  Pulse 59  Temp(Src) 98.6 F (37 C) (Oral)  Ht 6' (1.829 m)  Wt 242 lb (109.77 kg)  BMI 32.81 kg/m2  The patient appeared well nourished and normally developed, alert and oriented to time and place. Speech, behavior and judgement appear normal. Vital signs as documented.  Head exam is unremarkable. No scleral icterus or pallor noted. There was slight nasal congestion bilaterally but ears, mouth and throat were normal. Neck is without jugular venous distension, thyromegally, or carotid bruits. Carotid upstrokes are brisk bilaterally. No cervical adenopathy. Lungs are clear anteriorly and posteriorly to auscultation. Normal respiratory effort. Cardiac exam reveals regular rate and rhythm at 60 per minute. First and second heart sounds normal.  No murmurs, rubs or gallops.  Abdominal exam reveals normal bowl sounds, no masses, no organomegaly and no aortic enlargement. No inguinal adenopathy. There is no abdominal tenderness. Rectal exam revealed no masses. There was minimal if any prostate enlargement. There were no prostate lumps. The genitalia were normal and there was no sign of any testicular masses or hernia Extremities are  nonedematous and both femoral and pedal pulses are normal. Skin without pallor or jaundice.  Warm and dry, without rash. Neurologic exam reveals normal deep tendon reflexes and normal sensation.          Assessment & Plan:  1. Other and unspecified hyperlipidemia - NMR, lipoprofile  2. HTN (hypertension) - Hepatic function panel - BMP8+EGFR  3. Annual physical exam - POCT CBC - Hepatic function panel - BMP8+EGFR - NMR, lipoprofile -PSA  4. Hypertension  Patient Instructions  Continue aggressive therapeutic lifestyle changes Always drink plenty of fluids Don't put yourself at risk for falling We will look at doing a chest x-ray and cardiogram next spring Don't forget to get your flu shot in October   Nyra Capes MD

## 2013-06-10 ENCOUNTER — Other Ambulatory Visit: Payer: Self-pay | Admitting: *Deleted

## 2013-06-10 DIAGNOSIS — R972 Elevated prostate specific antigen [PSA]: Secondary | ICD-10-CM

## 2013-06-10 LAB — NMR, LIPOPROFILE
Cholesterol: 106 mg/dL (ref <200)
LDL Particle Number: 484 nmol/L (ref <1000)
LDLC SERPL CALC-MCNC: 34 mg/dL (ref <100)
Triglycerides by NMR: 125 mg/dL (ref <150)

## 2013-06-10 LAB — HEPATIC FUNCTION PANEL
Albumin: 4.6 g/dL (ref 3.6–4.8)
Bilirubin, Direct: 0.18 mg/dL (ref 0.00–0.40)
Total Protein: 7.2 g/dL (ref 6.0–8.5)

## 2013-06-10 LAB — BMP8+EGFR
BUN: 13 mg/dL (ref 8–27)
GFR calc Af Amer: 106 mL/min/{1.73_m2} (ref >59)
GFR calc non Af Amer: 92 mL/min/{1.73_m2} (ref >59)
Glucose: 104 mg/dL — ABNORMAL HIGH (ref 65–99)

## 2013-06-10 LAB — PSA, TOTAL AND FREE: PSA, Free Pct: 18.7 %

## 2013-06-17 ENCOUNTER — Other Ambulatory Visit (INDEPENDENT_AMBULATORY_CARE_PROVIDER_SITE_OTHER): Payer: BC Managed Care – PPO

## 2013-06-17 DIAGNOSIS — R972 Elevated prostate specific antigen [PSA]: Secondary | ICD-10-CM

## 2013-06-17 LAB — POCT UA - MICROSCOPIC ONLY
Bacteria, U Microscopic: NEGATIVE
Mucus, UA: NEGATIVE
RBC, urine, microscopic: NEGATIVE
WBC, Ur, HPF, POC: NEGATIVE

## 2013-06-17 LAB — POCT URINALYSIS DIPSTICK
Bilirubin, UA: NEGATIVE
Blood, UA: NEGATIVE
Glucose, UA: NEGATIVE
Nitrite, UA: NEGATIVE
Spec Grav, UA: 1.025

## 2013-06-17 NOTE — Progress Notes (Signed)
Pt dropped off urine

## 2013-07-14 ENCOUNTER — Encounter: Payer: Self-pay | Admitting: Family Medicine

## 2013-07-19 ENCOUNTER — Other Ambulatory Visit: Payer: Self-pay | Admitting: Family Medicine

## 2013-07-19 DIAGNOSIS — Z23 Encounter for immunization: Secondary | ICD-10-CM

## 2013-07-20 ENCOUNTER — Other Ambulatory Visit (INDEPENDENT_AMBULATORY_CARE_PROVIDER_SITE_OTHER): Payer: BC Managed Care – PPO

## 2013-07-20 DIAGNOSIS — R972 Elevated prostate specific antigen [PSA]: Secondary | ICD-10-CM

## 2013-07-20 NOTE — Progress Notes (Signed)
Patient came in for labs only.

## 2013-07-21 LAB — PSA, TOTAL AND FREE
PSA, Free Pct: 19.8 %
PSA, Free: 0.85 ng/mL

## 2013-07-22 ENCOUNTER — Other Ambulatory Visit: Payer: Self-pay | Admitting: *Deleted

## 2013-07-22 DIAGNOSIS — R972 Elevated prostate specific antigen [PSA]: Secondary | ICD-10-CM

## 2013-07-23 ENCOUNTER — Other Ambulatory Visit: Payer: Self-pay | Admitting: *Deleted

## 2013-07-23 MED ORDER — SULFAMETHOXAZOLE-TMP DS 800-160 MG PO TABS: 1.0000 | ORAL_TABLET | Freq: Two times a day (BID) | ORAL | Status: DC

## 2013-07-23 NOTE — Progress Notes (Signed)
Elevated PSA

## 2013-08-12 ENCOUNTER — Other Ambulatory Visit: Payer: Self-pay | Admitting: Family Medicine

## 2013-08-20 ENCOUNTER — Encounter (INDEPENDENT_AMBULATORY_CARE_PROVIDER_SITE_OTHER): Payer: Self-pay

## 2013-08-20 ENCOUNTER — Ambulatory Visit (INDEPENDENT_AMBULATORY_CARE_PROVIDER_SITE_OTHER): Payer: BC Managed Care – PPO | Admitting: Urology

## 2013-08-20 DIAGNOSIS — R972 Elevated prostate specific antigen [PSA]: Secondary | ICD-10-CM

## 2013-08-20 DIAGNOSIS — N400 Enlarged prostate without lower urinary tract symptoms: Secondary | ICD-10-CM

## 2013-09-08 ENCOUNTER — Ambulatory Visit (INDEPENDENT_AMBULATORY_CARE_PROVIDER_SITE_OTHER): Payer: BC Managed Care – PPO | Admitting: General Practice

## 2013-09-08 VITALS — BP 154/87 | HR 73 | Temp 98.2°F | Ht 72.0 in | Wt 247.0 lb

## 2013-09-08 DIAGNOSIS — R531 Weakness: Secondary | ICD-10-CM

## 2013-09-08 DIAGNOSIS — R42 Dizziness and giddiness: Secondary | ICD-10-CM

## 2013-09-08 DIAGNOSIS — R5383 Other fatigue: Secondary | ICD-10-CM

## 2013-09-08 DIAGNOSIS — R5381 Other malaise: Secondary | ICD-10-CM

## 2013-09-08 DIAGNOSIS — IMO0001 Reserved for inherently not codable concepts without codable children: Secondary | ICD-10-CM

## 2013-09-08 DIAGNOSIS — R030 Elevated blood-pressure reading, without diagnosis of hypertension: Secondary | ICD-10-CM

## 2013-09-08 LAB — POCT CBC
Lymph, poc: 1.4 (ref 0.6–3.4)
MCH, POC: 30.8 pg (ref 27–31.2)
MCV: 90.5 fL (ref 80–97)
Platelet Count, POC: 188 10*3/uL (ref 142–424)
RBC: 5 M/uL (ref 4.69–6.13)

## 2013-09-08 NOTE — Progress Notes (Signed)
   Subjective:    Patient ID: [name], male    DOB: 24-Apr-1951, 62 y.o.   MRN: [id number]  HPI Patient presents today with complaints of feeling lightheaded, dizzy, and jittery. He reports onset was as this morning and checked blood pressure. Readings were 181/107, 173/105, 177/105. Normal range 130's/70's. He denies chest pain, pressure, or shortness of breath.     Review of Systems  Constitutional: Negative for fever and chills.  Respiratory: Negative for cough and chest tightness.   Cardiovascular: Negative for chest pain, palpitations and leg swelling.  Gastrointestinal: Negative for nausea, vomiting, abdominal pain, diarrhea, constipation and blood in stool.  Genitourinary: Negative for difficulty urinating.  Neurological: Positive for dizziness. Negative for weakness and headaches.       Objective:   Physical Exam  Constitutional: He is oriented to person, place, and time. He appears well-developed and well-nourished.  HENT:  Head: Normocephalic and atraumatic.  Right Ear: External ear normal.  Left Ear: External ear normal.  Mouth/Throat: Oropharynx is clear and moist.  Eyes: Pupils are equal, round, and reactive to light.  Neck: Normal range of motion. Neck supple. No thyromegaly present.  Cardiovascular: Normal rate, regular rhythm and normal heart sounds.   Pulmonary/Chest: Effort normal and breath sounds normal. No respiratory distress. He exhibits no tenderness.  Abdominal: Soft. Bowel sounds are normal. He exhibits no distension. There is no tenderness.  Lymphadenopathy:    He has no cervical adenopathy.  Neurological: He is alert and oriented to person, place, and time.  Skin: Skin is warm and dry.  Psychiatric: He has a normal mood and affect.          Assessment & Plan:  1. Weakness  - POCT glucose (manual entry)  2. Elevated blood pressure  - EKG 12-Lead (normal sinus rhythm) - CMP14+EGFR - NMR, lipoprofile  3. Dizziness  - EKG 12-Lead -  POCT CBC -discussed healthier eating (low sodium diet), baked, low fat foods -discussed some form of regular exercise as tolerated -monitor blood pressure at home, keeping diary and RTO for follow up in 2 week -seek emergency medical treatment if symptoms worsen -Patient verbalized understanding Coralie Keens, FNP-C

## 2013-09-10 LAB — NMR, LIPOPROFILE
HDL Particle Number: 38.2 umol/L (ref >=30.5)
LDL Size: 20.8 nm (ref >20.5)
LP-IR Score: 62 — ABNORMAL HIGH (ref <=45)
Small LDL Particle Number: 234 nmol/L (ref <=527)

## 2013-09-10 LAB — CMP14+EGFR
AST: 20 IU/L (ref 0–40)
Albumin/Globulin Ratio: 1.4 (ref 1.1–2.5)
Alkaline Phosphatase: 143 IU/L — ABNORMAL HIGH (ref 39–117)
BUN/Creatinine Ratio: 20 (ref 10–22)
CO2: 29 mmol/L (ref 18–29)
Creatinine, Ser: 0.89 mg/dL (ref 0.76–1.27)
GFR calc Af Amer: 105 mL/min/{1.73_m2} (ref >59)
GFR calc non Af Amer: 91 mL/min/{1.73_m2} (ref >59)
Globulin, Total: 3 g/dL (ref 1.5–4.5)
Sodium: 141 mmol/L (ref 134–144)
Total Bilirubin: 0.4 mg/dL (ref 0.0–1.2)

## 2013-09-21 ENCOUNTER — Telehealth: Payer: Self-pay | Admitting: Family Medicine

## 2013-09-21 MED ORDER — MECLIZINE HCL 32 MG PO TABS: 32.0000 mg | ORAL_TABLET | Freq: Three times a day (TID) | ORAL | Status: DC | PRN

## 2013-09-21 MED ORDER — AZITHROMYCIN 250 MG PO TABS: ORAL_TABLET | ORAL | Status: DC

## 2013-09-21 NOTE — Telephone Encounter (Signed)
Pt c/o dizzy, HA, sinus and congestion Med called in per dr Christell Constant

## 2013-09-27 ENCOUNTER — Telehealth: Payer: Self-pay | Admitting: Family Medicine

## 2013-09-27 NOTE — Telephone Encounter (Signed)
PT AWARE OF LABS WNL.  RS

## 2013-09-27 NOTE — Telephone Encounter (Signed)
Message copied by Azalee Course on Mon Sep 27, 2013  2:40 PM ------      Message from: Carl Best, [place] E      Created: Thu Sep 16, 2013 10:40 AM       Please inform labs are within limits, will recheck with routine exam. ------

## 2013-09-29 ENCOUNTER — Emergency Department (HOSPITAL_COMMUNITY)
Admission: EM | Admit: 2013-09-29 | Discharge: 2013-09-29 | Disposition: A | Discharge status: Home / Self Care | Payer: BC Managed Care – PPO | Attending: Emergency Medicine | Admitting: Emergency Medicine

## 2013-09-29 ENCOUNTER — Encounter (HOSPITAL_COMMUNITY): Payer: Self-pay | Admitting: Emergency Medicine

## 2013-09-29 ENCOUNTER — Emergency Department (HOSPITAL_COMMUNITY): Payer: BC Managed Care – PPO

## 2013-09-29 DIAGNOSIS — Z7982 Long term (current) use of aspirin: Secondary | ICD-10-CM | POA: Insufficient documentation

## 2013-09-29 DIAGNOSIS — Z880 Allergy status to penicillin: Secondary | ICD-10-CM | POA: Insufficient documentation

## 2013-09-29 DIAGNOSIS — Y9229 Other specified public building as the place of occurrence of the external cause: Secondary | ICD-10-CM | POA: Insufficient documentation

## 2013-09-29 DIAGNOSIS — X500XXA Overexertion from strenuous movement or load, initial encounter: Secondary | ICD-10-CM | POA: Insufficient documentation

## 2013-09-29 DIAGNOSIS — K219 Gastro-esophageal reflux disease without esophagitis: Secondary | ICD-10-CM | POA: Insufficient documentation

## 2013-09-29 DIAGNOSIS — E785 Hyperlipidemia, unspecified: Secondary | ICD-10-CM | POA: Insufficient documentation

## 2013-09-29 DIAGNOSIS — M25572 Pain in left ankle and joints of left foot: Secondary | ICD-10-CM

## 2013-09-29 DIAGNOSIS — S93609A Unspecified sprain of unspecified foot, initial encounter: Secondary | ICD-10-CM | POA: Insufficient documentation

## 2013-09-29 DIAGNOSIS — Z87448 Personal history of other diseases of urinary system: Secondary | ICD-10-CM | POA: Insufficient documentation

## 2013-09-29 DIAGNOSIS — I10 Essential (primary) hypertension: Secondary | ICD-10-CM | POA: Insufficient documentation

## 2013-09-29 DIAGNOSIS — Y9389 Activity, other specified: Secondary | ICD-10-CM | POA: Insufficient documentation

## 2013-09-29 DIAGNOSIS — Z79899 Other long term (current) drug therapy: Secondary | ICD-10-CM | POA: Insufficient documentation

## 2013-09-29 MED ORDER — NAPROXEN 500 MG PO TABS: 500.0000 mg | ORAL_TABLET | Freq: Two times a day (BID) | ORAL | Status: DC
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
## 2013-09-29 NOTE — ED Notes (Signed)
The ortho tech has been called for a splint and crutches

## 2013-09-29 NOTE — Progress Notes (Signed)
Orthopedic Tech Progress Note Patient Details:  [name] [birth year] [id number]  Ortho Devices Type of Ortho Device: Ankle splint;Crutches Ortho Device/Splint Interventions: Ordered;Application   Jennye Moccasin 09/29/2013, 9:30 PM

## 2013-09-29 NOTE — ED Notes (Signed)
Pt to xray

## 2013-09-30 NOTE — ED Provider Notes (Signed)
Medical screening examination/treatment/procedure(s) were performed by non-physician practitioner and as supervising physician I was immediately available for consultation/collaboration.  EKG Interpretation   None         Ladoris Lythgoe M Zoie Sarin, MD 09/30/13 0044 

## 2013-10-04 ENCOUNTER — Telehealth: Payer: Self-pay | Admitting: General Practice

## 2013-10-04 NOTE — Telephone Encounter (Signed)
Requesting PSA results but that lab was not done.

## 2013-10-04 NOTE — Telephone Encounter (Signed)
Message copied by Shelbie Ammons on Mon Oct 04, 2013 11:21 AM ------      Message from: Aleen Sells E      Created: Thu Sep 16, 2013 10:40 AM       Please inform labs are within limits, will recheck with routine exam. ------

## 2013-10-04 NOTE — Telephone Encounter (Signed)
Pt. Aware,no PSA done. He is scheduled for bx on Wednesday.

## 2013-11-15 ENCOUNTER — Telehealth: Payer: Self-pay | Admitting: Family Medicine

## 2013-11-15 ENCOUNTER — Emergency Department (HOSPITAL_COMMUNITY)
Admission: EM | Admit: 2013-11-15 | Discharge: 2013-11-15 | Disposition: A | Discharge status: Home / Self Care | Payer: BC Managed Care – PPO | Attending: Emergency Medicine | Admitting: Emergency Medicine

## 2013-11-15 ENCOUNTER — Encounter (HOSPITAL_COMMUNITY): Payer: Self-pay | Admitting: Emergency Medicine

## 2013-11-15 ENCOUNTER — Encounter: Payer: Self-pay | Admitting: Family Medicine

## 2013-11-15 ENCOUNTER — Ambulatory Visit (INDEPENDENT_AMBULATORY_CARE_PROVIDER_SITE_OTHER): Payer: BC Managed Care – PPO | Admitting: Family Medicine

## 2013-11-15 ENCOUNTER — Emergency Department (HOSPITAL_COMMUNITY): Payer: BC Managed Care – PPO

## 2013-11-15 VITALS — BP 141/86 | HR 81 | Temp 97.5°F | Ht 72.0 in | Wt 250.4 lb

## 2013-11-15 DIAGNOSIS — E785 Hyperlipidemia, unspecified: Secondary | ICD-10-CM | POA: Insufficient documentation

## 2013-11-15 DIAGNOSIS — Z8669 Personal history of other diseases of the nervous system and sense organs: Secondary | ICD-10-CM | POA: Insufficient documentation

## 2013-11-15 DIAGNOSIS — Z880 Allergy status to penicillin: Secondary | ICD-10-CM | POA: Insufficient documentation

## 2013-11-15 DIAGNOSIS — Z9889 Other specified postprocedural states: Secondary | ICD-10-CM | POA: Insufficient documentation

## 2013-11-15 DIAGNOSIS — Z79899 Other long term (current) drug therapy: Secondary | ICD-10-CM | POA: Insufficient documentation

## 2013-11-15 DIAGNOSIS — I10 Essential (primary) hypertension: Secondary | ICD-10-CM | POA: Insufficient documentation

## 2013-11-15 DIAGNOSIS — Z7982 Long term (current) use of aspirin: Secondary | ICD-10-CM | POA: Insufficient documentation

## 2013-11-15 DIAGNOSIS — R109 Unspecified abdominal pain: Secondary | ICD-10-CM

## 2013-11-15 DIAGNOSIS — K5289 Other specified noninfective gastroenteritis and colitis: Secondary | ICD-10-CM | POA: Insufficient documentation

## 2013-11-15 DIAGNOSIS — K529 Noninfective gastroenteritis and colitis, unspecified: Secondary | ICD-10-CM

## 2013-11-15 DIAGNOSIS — K219 Gastro-esophageal reflux disease without esophagitis: Secondary | ICD-10-CM | POA: Insufficient documentation

## 2013-11-15 DIAGNOSIS — R42 Dizziness and giddiness: Secondary | ICD-10-CM | POA: Insufficient documentation

## 2013-11-15 LAB — URINALYSIS, ROUTINE W REFLEX MICROSCOPIC
Glucose, UA: NEGATIVE mg/dL
Hgb urine dipstick: NEGATIVE
KETONES UR: 15 mg/dL — AB
LEUKOCYTES UA: NEGATIVE
Nitrite: NEGATIVE
PROTEIN: NEGATIVE mg/dL
Specific Gravity, Urine: 1.034 — ABNORMAL HIGH (ref 1.005–1.030)
Urobilinogen, UA: 0.2 mg/dL (ref 0.0–1.0)
pH: 5.5 (ref 5.0–8.0)

## 2013-11-15 LAB — CBC WITH DIFFERENTIAL/PLATELET
Basophils Absolute: 0 10*3/uL (ref 0.0–0.1)
Basophils Relative: 0 % (ref 0–1)
EOS PCT: 1 % (ref 0–5)
Eosinophils Absolute: 0.1 10*3/uL (ref 0.0–0.7)
HCT: 45.3 % (ref 39.0–52.0)
Hemoglobin: 16.3 g/dL (ref 13.0–17.0)
LYMPHS ABS: 1.7 10*3/uL (ref 0.7–4.0)
LYMPHS PCT: 15 % (ref 12–46)
MCH: 32 pg (ref 26.0–34.0)
MCHC: 36 g/dL (ref 30.0–36.0)
MCV: 88.8 fL (ref 78.0–100.0)
MONOS PCT: 5 % (ref 3–12)
Monocytes Absolute: 0.6 10*3/uL (ref 0.1–1.0)
Neutro Abs: 9 10*3/uL — ABNORMAL HIGH (ref 1.7–7.7)
Neutrophils Relative %: 79 % — ABNORMAL HIGH (ref 43–77)
PLATELETS: 218 10*3/uL (ref 150–400)
RBC: 5.1 MIL/uL (ref 4.22–5.81)
RDW: 13.2 % (ref 11.5–15.5)
WBC: 11.4 10*3/uL — AB (ref 4.0–10.5)

## 2013-11-15 LAB — COMPREHENSIVE METABOLIC PANEL
ALT: 27 U/L (ref 0–53)
AST: 22 U/L (ref 0–37)
Albumin: 4.3 g/dL (ref 3.5–5.2)
Alkaline Phosphatase: 127 U/L — ABNORMAL HIGH (ref 39–117)
BILIRUBIN TOTAL: 0.7 mg/dL (ref 0.3–1.2)
BUN: 21 mg/dL (ref 6–23)
CHLORIDE: 101 meq/L (ref 96–112)
CO2: 26 mEq/L (ref 19–32)
Calcium: 9.8 mg/dL (ref 8.4–10.5)
Creatinine, Ser: 0.89 mg/dL (ref 0.50–1.35)
GFR calc Af Amer: >90 mL/min (ref >90)
GFR calc non Af Amer: 89 mL/min — ABNORMAL LOW (ref >90)
Glucose, Bld: 116 mg/dL — ABNORMAL HIGH (ref 70–99)
Potassium: 4.1 mEq/L (ref 3.7–5.3)
Sodium: 141 mEq/L (ref 137–147)
TOTAL PROTEIN: 8 g/dL (ref 6.0–8.3)

## 2013-11-15 LAB — POCT I-STAT TROPONIN I: TROPONIN I, POC: 0 ng/mL (ref 0.00–0.08)

## 2013-11-15 LAB — LIPASE, BLOOD: Lipase: 17 U/L (ref 11–59)

## 2013-11-15 MED ORDER — ONDANSETRON HCL 4 MG PO TABS: 4.0000 mg | ORAL_TABLET | Freq: Four times a day (QID) | ORAL | Status: DC

## 2013-11-15 MED ORDER — ONDANSETRON HCL 4 MG/2ML IJ SOLN
4.0000 mg | Freq: Once | INTRAMUSCULAR | Status: AC
  Administered 2013-11-15: 4 mg via INTRAVENOUS
  Filled 2013-11-15: qty 2

## 2013-11-15 MED ORDER — HYDROMORPHONE HCL PF 1 MG/ML IJ SOLN
1.0000 mg | Freq: Once | INTRAMUSCULAR | Status: AC
  Administered 2013-11-15: 1 mg via INTRAVENOUS
  Filled 2013-11-15: qty 1

## 2013-11-15 MED ORDER — IOHEXOL 300 MG/ML  SOLN
20.0000 mL | INTRAMUSCULAR | Status: AC
  Administered 2013-11-15: 20 mL via ORAL

## 2013-11-15 MED ORDER — SODIUM CHLORIDE 0.9 % IV BOLUS (SEPSIS)
1000.0000 mL | Freq: Once | INTRAVENOUS | Status: AC
  Administered 2013-11-15: 1000 mL via INTRAVENOUS

## 2013-11-15 MED ORDER — IOHEXOL 300 MG/ML  SOLN
100.0000 mL | Freq: Once | INTRAMUSCULAR | Status: AC | PRN
  Administered 2013-11-15: 100 mL via INTRAVENOUS

## 2013-11-15 MED ORDER — OXYCODONE-ACETAMINOPHEN 5-325 MG PO TABS: 1.0000 | ORAL_TABLET | Freq: Four times a day (QID) | ORAL | Status: DC | PRN

## 2013-11-15 NOTE — Telephone Encounter (Signed)
appt scheduled for today

## 2013-11-15 NOTE — ED Notes (Signed)
Patient transported to CT 

## 2013-11-15 NOTE — ED Provider Notes (Signed)
CSN: [id number]     Arrival date & time 11/15/13  1436 History   First MD Initiated Contact with Patient 11/15/13 1655     Chief Complaint  Patient presents with  . Abdominal Pain   HPI Comments: 63 yo M hx of HTN, BPPV, GERD, Hiatal hernia s/p surgical repair, SBO 2013, presents with CC of abdominal pain.  Symptoms started midnight last night.  Pt has had continuous abdominal pain since that time, diffuse, nonradiating, with associated nausea, bloating.  Pt has also had some vertigo, which pt states is 2/2 to BPPV.  Pt has had normal bowel movement earlier today, and continues to have flatus.  Denies fever, chills, CP, SOB, vomiting, diarrhea, myalgias, rash, or any other symptoms.  Pt states pain feels dissimilar to previous SBO, as pain is constant, unremitting, and that he still has bowel movements.  Last colonoscopy 2009, which reportedly from pt was WNL.  No personal hx of diverticulosis, or diverticulitis.    The history is provided by the patient. No language interpreter was used.    Past Medical History  Diagnosis Date  . Essential hypertension, benign   . Other and unspecified hyperlipidemia   . Reflux   . Vertigo, intermittent   . Other testicular hypofunction   . Acid reflux    Past Surgical History  Procedure Laterality Date  . Hiatal hernia repair    . Lumbar disc surgery     Family History  Problem Relation Age of Onset  . Hypertension Mother   . Melanoma Mother   . Hypertension Father   . Diabetes Father    History  Substance Use Topics  . Smoking status: Never Smoker   . Smokeless tobacco: Not on file  . Alcohol Use: Yes    Review of Systems  Constitutional: Negative for fever and chills.  Respiratory: Negative for cough and shortness of breath.   Cardiovascular: Negative for chest pain.  Gastrointestinal: Positive for nausea, abdominal pain and abdominal distention. Negative for vomiting, diarrhea and constipation.  Musculoskeletal: Negative for myalgias.   Skin: Negative for rash.  Neurological: Positive for dizziness. Negative for weakness, light-headedness, numbness and headaches.  Hematological: Negative for adenopathy. Does not bruise/bleed easily.  All other systems reviewed and are negative.      Allergies  Penicillins  Home Medications   Current Outpatient Rx  Name  Route  Sig  Dispense  Refill  . amLODipine (NORVASC) 5 MG tablet   Oral   Take 5 mg by mouth daily.         Marland Kitchen. aspirin 81 MG tablet   Oral   Take 81 mg by mouth daily.         . Cholecalciferol (VITAMIN D) 2000 UNITS CAPS   Oral   Take 1 capsule by mouth daily.           Marland Kitchen. esomeprazole (NEXIUM) 40 MG capsule   Oral   Take 40 mg by mouth every other day.          . metoprolol succinate (TOPROL-XL) 100 MG 24 hr tablet   Oral   Take 50 mg by mouth daily. Take with or immediately following a meal.         . rosuvastatin (CRESTOR) 20 MG tablet   Oral   Take 10 mg by mouth every other day.           BP 169/98  Pulse 76  Temp(Src) 98.1 F (36.7 C) (Oral)  Resp 18  SpO2  98% Physical Exam  Nursing note and vitals reviewed. Constitutional: He is oriented to person, place, and time. He appears well-developed and well-nourished.  HENT:  Head: Normocephalic and atraumatic.  Right Ear: External ear normal.  Left Ear: External ear normal.  Nose: Nose normal.  Mouth/Throat: Oropharynx is clear and moist.  Eyes: Conjunctivae and EOM are normal. Pupils are equal, round, and reactive to light.  Neck: Normal range of motion. Neck supple.  Cardiovascular: Normal rate, regular rhythm, normal heart sounds and intact distal pulses.   Pulmonary/Chest: Effort normal and breath sounds normal. No respiratory distress. He has no wheezes. He has no rales. He exhibits no tenderness.  Abdominal: Soft. Bowel sounds are normal. He exhibits no distension and no mass. There is tenderness. There is no rebound and no guarding.  TTP LLQ, suprapubic.  Soft.   Nondistended.  No guarding, rebound.    Musculoskeletal: Normal range of motion.  Neurological: He is alert and oriented to person, place, and time.  Skin: Skin is warm and dry.    ED Course  Procedures (including critical care time) Labs Review Labs Reviewed  URINALYSIS, ROUTINE W REFLEX MICROSCOPIC - Abnormal; Notable for the following:    APPearance HAZY (*)    Specific Gravity, Urine 1.034 (*)    Bilirubin Urine SMALL (*)    Ketones, ur 15 (*)    All other components within normal limits  COMPREHENSIVE METABOLIC PANEL - Abnormal; Notable for the following:    Glucose, Bld 116 (*)    Alkaline Phosphatase 127 (*)    GFR calc non Af Amer 89 (*)    All other components within normal limits  CBC WITH DIFFERENTIAL - Abnormal; Notable for the following:    WBC 11.4 (*)    Neutrophils Relative % 79 (*)    Neutro Abs 9.0 (*)    All other components within normal limits  LIPASE, BLOOD  POCT I-STAT TROPONIN I   Imaging Review Ct Abdomen Pelvis W Contrast  11/15/2013   CLINICAL DATA:  Distended abdomen with nausea x1 day  EXAM: CT ABDOMEN AND PELVIS WITH CONTRAST  TECHNIQUE: Multidetector CT imaging of the abdomen and pelvis was performed using the standard protocol following bolus administration of intravenous contrast.  CONTRAST:  152mL OMNIPAQUE IOHEXOL 300 MG/ML  SOLN  COMPARISON:  DG ABD 1 VIEW dated 03/24/2012; CT ABD/PELVIS W CM dated 03/22/2012  FINDINGS: Lung bases are clear.  No pericardial fluid.  No focal hepatic lesion. The gallbladder, pancreas, spleen, and adrenal glands are normal. . There is nonobstructing 4 mm calculus in the right kidney.  Stomach is normal. The fluid filled loops of small bowel at the upper limits of normal at normal at 3 cm. There may be very mild edema within the mesentery of the small bowel. Colon is normal. Appendix not identified.  Abdominal aorta is normal caliber. No retroperitoneal periportal lymphadenopathy.  No free fluid the pelvis. No pelvic  lymphadenopathy. Prostate gland and bladder normal.  IMPRESSION:  1. Fluid-filled loops of small bowel with mild mesenteric edema could represent enteritis. 2. No evidence of obstruction. 3. Right nephrolithiasis without obstructive uropathy.   Electronically Signed   By: Suzy Bouchard M.D.   On: 11/15/2013 19:29    EKG Interpretation   None       MDM   Final diagnoses:  None   63 yo M hx of HTN, BPPV, GERD, Hiatal hernia s/p surgical repair, SBO 2013, presents with CC of abdominal pain.  Filed Vitals:  11/15/13 2131  BP:   Pulse:   Temp: 98.5 F (36.9 C)  Resp: 16   Physical exam as above.  Pt hemodynamically stable, slightly hypertensive, likely 2/2 pain and known HTN.  Pt nauseated, but not vomiting.  Abdomen is soft, nondistended, TTP LLQ, suprapubic pain, no rebound, no guarding, or signs of peritonitis.  Pt given IVF, zofran, dilaudid for symptomatic relief, with improvement.    CBC with mild leukocytosis 11.4, normal Hgb 18.3.  CMP WNL.  Lipase WNL.  Troponin 0.00.  UA negative nitrite, negative leukocytes, 15 ketones, slightly elevated specific gravity.  Leading differential SBO, diverticulitis, mesenteric ischemia.  CT abdomen ordered which showed fluid-fillled loops of small bowel with mid mesenteric edema representing enteritis, without evidence of obstruction, right nephrolithiasis without obstructive uropathy.  On reeval pt's symptoms improved.   Pt to be d/c home in good condition.  Encouraged to continue supportive care.  Rx Zofran, Percocet.  F/u with PCP in 1 week.  Strict return precautions given.  Pt understands and agrees with plan.  I have discussed pt's care plan with Dr. Reather Converse.  Sinda Du, MD      Sinda Du, MD 11/15/13 [phone]

## 2013-11-15 NOTE — ED Notes (Signed)
CT dept notified that PT completed oral contrast.

## 2013-11-15 NOTE — Progress Notes (Signed)
   Subjective:    Patient ID: [name], male    DOB: 08/02/1951, 63 y.o.   MRN: [id number]  HPI This 63 y.o. male presents for evaluation of abdominal pain.   Review of Systems C/o abdominal pain No chest pain, SOB, HA, dizziness, vision change, N/V, diarrhea, constipation, dysuria, urinary urgency or frequency, myalgias, arthralgias or rash.     Objective:   Physical Exam  Vital signs noted  Well developed well nourished male.  HEENT - Head atraumatic Normocephalic                Throat - oropharanx wnl Respiratory - Lungs CTA bilateral Cardiac - RRR S1 and S2 without murmur GI - Abdomen tender and distended with tympany over upper abdomen.     Assessment & Plan:  Abdominal pain, unspecified site Discussed with patient that this may be SBO again and he should go to ED  And get seen there and he agrees and leaves via POV.  Lysbeth Penner FNP

## 2013-11-15 NOTE — ED Notes (Signed)
Pt is here with upper abdominal pain that started last nite.  Pt reports loose stools this am.  Distention to abdomen and reports burping.  Pt has history of intestinal blockage.  Pt reports dizzy spells that he has had before

## 2013-11-16 NOTE — ED Provider Notes (Signed)
Medical screening examination/treatment/procedure(s) were conducted as a shared visit with non-physician practitioner(s) or resident  and myself.  I personally evaluated the patient during the encounter and agree with the findings and plan unless otherwise indicated.    I have personally reviewed any xrays and/ or EKG's with the provider and I agree with interpretation.   Central and left lower abd pain.  Concern for enteritis vs ischemic bowel vs diverticulitis. Fluids, pain meds given.  Central and LLQ abdo pain, no guarding, mild dry mm.  CT enteritis. Pt improved in ED.  Fup outpt.   Enteritis, LLQ abdo pain  Mariea Clonts, MD 11/16/13 [phone]

## 2013-12-02 ENCOUNTER — Other Ambulatory Visit: Payer: Self-pay | Admitting: Family Medicine

## 2014-02-08 ENCOUNTER — Other Ambulatory Visit: Payer: Self-pay | Admitting: Family Medicine

## 2014-02-09 ENCOUNTER — Telehealth: Payer: Self-pay | Admitting: Family Medicine

## 2014-02-09 NOTE — Telephone Encounter (Signed)
appt scheduled

## 2014-02-28 ENCOUNTER — Ambulatory Visit (INDEPENDENT_AMBULATORY_CARE_PROVIDER_SITE_OTHER): Payer: BC Managed Care – PPO

## 2014-02-28 ENCOUNTER — Encounter: Payer: Self-pay | Admitting: Family Medicine

## 2014-02-28 ENCOUNTER — Ambulatory Visit (INDEPENDENT_AMBULATORY_CARE_PROVIDER_SITE_OTHER): Payer: BC Managed Care – PPO | Admitting: Family Medicine

## 2014-02-28 VITALS — BP 128/82 | HR 70 | Temp 98.2°F | Ht 72.0 in | Wt 247.0 lb

## 2014-02-28 DIAGNOSIS — R972 Elevated prostate specific antigen [PSA]: Secondary | ICD-10-CM

## 2014-02-28 DIAGNOSIS — M25572 Pain in left ankle and joints of left foot: Secondary | ICD-10-CM

## 2014-02-28 DIAGNOSIS — I10 Essential (primary) hypertension: Secondary | ICD-10-CM

## 2014-02-28 DIAGNOSIS — M25579 Pain in unspecified ankle and joints of unspecified foot: Secondary | ICD-10-CM

## 2014-02-28 DIAGNOSIS — E785 Hyperlipidemia, unspecified: Secondary | ICD-10-CM

## 2014-02-28 DIAGNOSIS — E559 Vitamin D deficiency, unspecified: Secondary | ICD-10-CM

## 2014-02-28 DIAGNOSIS — E8881 Metabolic syndrome: Secondary | ICD-10-CM

## 2014-02-28 DIAGNOSIS — J309 Allergic rhinitis, unspecified: Secondary | ICD-10-CM

## 2014-02-28 LAB — POCT CBC
Granulocyte percent: 69.3 %G (ref 37–80)
HEMATOCRIT: 45.3 % (ref 43.5–53.7)
Hemoglobin: 14.6 g/dL (ref 14.1–18.1)
Lymph, poc: 1.7 (ref 0.6–3.4)
MCH, POC: 29.3 pg (ref 27–31.2)
MCHC: 32.2 g/dL (ref 31.8–35.4)
MCV: 90.9 fL (ref 80–97)
MPV: 8 fL (ref 0–99.8)
POC Granulocyte: 4.2 (ref 2–6.9)
POC LYMPH %: 27.7 % (ref 10–50)
Platelet Count, POC: 185 10*3/uL (ref 142–424)
RBC: 5 M/uL (ref 4.69–6.13)
RDW, POC: 13 %
WBC: 6.1 10*3/uL (ref 4.6–10.2)

## 2014-02-28 LAB — POCT GLYCOSYLATED HEMOGLOBIN (HGB A1C): Hemoglobin A1C: 6.4

## 2014-02-28 NOTE — Patient Instructions (Addendum)
Continue current medications. Continue good therapeutic lifestyle changes which include good diet and exercise. Fall precautions discussed with patient. If an FOBT was given today- please return it to our front desk. If you are over 64 years old - you may need Prevnar 47 or the adult Pneumonia vaccine.  Stays active as possible and drink as much fluid as possible the summer Use Flonase nasal spray over-the-counter 2 sprays each nostril at bedtime for one week, then reduce to one spray each nostril at bedtime Use airway protection if mowing the yard etc. We will arrange for you to have a stress test next summer. Check with your insurance regarding the Prevnar vaccine.
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
## 2014-03-01 LAB — HEPATIC FUNCTION PANEL
ALK PHOS: 120 IU/L — AB (ref 39–117)
ALT: 31 IU/L (ref 0–44)
AST: 20 IU/L (ref 0–40)
Albumin: 4.4 g/dL (ref 3.6–4.8)
Bilirubin, Direct: 0.15 mg/dL (ref 0.00–0.40)
Total Bilirubin: 0.6 mg/dL (ref 0.0–1.2)
Total Protein: 7.1 g/dL (ref 6.0–8.5)

## 2014-03-01 LAB — NMR, LIPOPROFILE
CHOLESTEROL: 110 mg/dL (ref 100–199)
HDL Cholesterol by NMR: 42 mg/dL (ref >39)
HDL Particle Number: 35.6 umol/L (ref >=30.5)
LDL Particle Number: 451 nmol/L (ref <1000)
LDL SIZE: 20.3 nm (ref >20.5)
LDLC SERPL CALC-MCNC: 44 mg/dL (ref 0–99)
LP-IR SCORE: 52 — AB (ref <=45)
Small LDL Particle Number: 312 nmol/L (ref <=527)
Triglycerides by NMR: 122 mg/dL (ref 0–149)

## 2014-03-01 LAB — VITAMIN D 25 HYDROXY (VIT D DEFICIENCY, FRACTURES): Vit D, 25-Hydroxy: 34.3 ng/mL (ref 30.0–100.0)

## 2014-03-01 LAB — BMP8+EGFR
BUN/Creatinine Ratio: 16 (ref 10–22)
BUN: 14 mg/dL (ref 8–27)
CO2: 24 mmol/L (ref 18–29)
Calcium: 9.3 mg/dL (ref 8.6–10.2)
Chloride: 101 mmol/L (ref 97–108)
Creatinine, Ser: 0.88 mg/dL (ref 0.76–1.27)
GFR, EST AFRICAN AMERICAN: 106 mL/min/{1.73_m2} (ref >59)
GFR, EST NON AFRICAN AMERICAN: 91 mL/min/{1.73_m2} (ref >59)
Glucose: 110 mg/dL — ABNORMAL HIGH (ref 65–99)
POTASSIUM: 3.9 mmol/L (ref 3.5–5.2)
SODIUM: 141 mmol/L (ref 134–144)

## 2014-03-01 LAB — PSA, TOTAL AND FREE
PSA, Free Pct: 16.5 %
PSA, Free: 0.76 ng/mL
PSA: 4.6 ng/mL — AB (ref 0.0–4.0)

## 2014-03-02 ENCOUNTER — Telehealth: Payer: Self-pay

## 2014-03-02 NOTE — Telephone Encounter (Signed)
LM with results of CXR-neg and ankle X-rays-degenerative/arthritic changes. Also stated if he continues to have problems we can refer him to [facility] to see Dr. Hewitt. No need for him to return the call unless he wanted f/u or had questions 

## 2014-03-02 NOTE — Telephone Encounter (Signed)
Message copied by Koren Bound on Wed Mar 02, 2014  9:16 AM ------      Message from: Chipper Herb      Created: Mon Feb 28, 2014  5:45 PM       As per radiology report ------

## 2014-03-02 NOTE — Telephone Encounter (Signed)
LM with results of CXR-neg and ankle X-rays-degenerative/arthritic changes. Also stated if he continues to have problems we can refer him to [facility] to see Dr. Doran Durand. No need for him to return the call unless he wanted f/u or had questions

## 2014-03-02 NOTE — Telephone Encounter (Signed)
Message copied by Koren Bound on Wed Mar 02, 2014  9:15 AM ------      Message from: Chipper Herb      Created: Mon Feb 28, 2014  5:46 PM       As per radiology report----degenerative changes and spur formation as indicated above he has probably destroyed up some arthritis from old injuries from the past. He continues to have trouble he may need an injection he can be referred to an orthopedic surgeon------- Dr. Doran Durand ------

## 2014-03-03 ENCOUNTER — Telehealth: Payer: Self-pay | Admitting: Family Medicine

## 2014-03-03 NOTE — Telephone Encounter (Signed)
Patient aware of results.

## 2014-03-14 ENCOUNTER — Other Ambulatory Visit (INDEPENDENT_AMBULATORY_CARE_PROVIDER_SITE_OTHER): Payer: BC Managed Care – PPO

## 2014-03-14 DIAGNOSIS — C61 Malignant neoplasm of prostate: Secondary | ICD-10-CM

## 2014-03-14 DIAGNOSIS — R7989 Other specified abnormal findings of blood chemistry: Secondary | ICD-10-CM

## 2014-03-14 NOTE — Progress Notes (Signed)
Pt came in for lab  only 

## 2014-03-15 ENCOUNTER — Telehealth: Payer: Self-pay | Admitting: Family Medicine

## 2014-03-15 LAB — BMP8+EGFR
BUN / CREAT RATIO: 23 — AB (ref 10–22)
BUN: 22 mg/dL (ref 8–27)
CO2: 27 mmol/L (ref 18–29)
CREATININE: 0.95 mg/dL (ref 0.76–1.27)
Calcium: 9.5 mg/dL (ref 8.6–10.2)
Chloride: 100 mmol/L (ref 97–108)
GFR calc Af Amer: 98 mL/min/{1.73_m2} (ref >59)
GFR calc non Af Amer: 85 mL/min/{1.73_m2} (ref >59)
Glucose: 131 mg/dL — ABNORMAL HIGH (ref 65–99)
Potassium: 4.1 mmol/L (ref 3.5–5.2)
SODIUM: 142 mmol/L (ref 134–144)

## 2014-03-15 LAB — PSA, TOTAL AND FREE
PSA, Free Pct: 15.7 %
PSA, Free: 0.91 ng/mL
PSA: 5.8 ng/mL — ABNORMAL HIGH (ref 0.0–4.0)

## 2014-03-15 NOTE — Telephone Encounter (Signed)
Message copied by Cline Crock on Tue Mar 15, 2014  3:28 PM ------      Message from: Chipper Herb      Created: Tue Mar 15, 2014  6:58 AM       The blood sugar is elevated at 131. Creatinine, most important kidney function test is within normal limits. The potassium and electrolytes are all within normal limit      The PSA is 5.8. 2 weeks ago it was 4.6. 7 months ago was 4.3.------- please let the patient know these results and make sure that Dr. Jeffie Pollock gets a copy of this report ------

## 2014-03-18 ENCOUNTER — Encounter: Payer: Self-pay | Admitting: Pharmacist

## 2014-03-18 ENCOUNTER — Ambulatory Visit (INDEPENDENT_AMBULATORY_CARE_PROVIDER_SITE_OTHER): Payer: BC Managed Care – PPO | Admitting: Pharmacist

## 2014-03-18 VITALS — BP 138/78 | HR 72 | Ht 72.0 in | Wt 243.0 lb

## 2014-03-18 DIAGNOSIS — R7303 Prediabetes: Secondary | ICD-10-CM | POA: Insufficient documentation

## 2014-03-18 DIAGNOSIS — R7309 Other abnormal glucose: Secondary | ICD-10-CM

## 2014-03-18 NOTE — Progress Notes (Signed)
Patient ID: [name], male   DOB: 1951-06-23, 63 y.o.   MRN: [id number] Diabetes Flow Sheet:  Visit 1  Chief Complaint:   Chief Complaint  Patient presents with  . Hyperglycemia    pre diabetes    HPI:  Patient was newly diagnosed with pre-diabets (A1c was 6.4%)  He was not follow any special diet until about 2 weeks ago when told BG was elevated.  He has a family history of type 2 DM - father and brother.  BMI:  Body mass index is 32.95 kg/(m^2).   Weight changes:  Down about 4 # General Appearance:  alert, oriented, no acute distress and obese Mood/Affect:  normal  Low fat/carbohydrate diet?  No Nicotine Abuse?  No Medication Compliance?  Yes Exercise?  No Alcohol Abuse?  No   Lab Results  Component Value Date   HGBA1C 6.4 02/28/2014    Lab Results  Component Value Date   CHOL 110 02/28/2014   HDL 42 02/28/2014   LDLCALC 44 02/28/2014   TRIG 122 02/28/2014      Assessment: 1.  Pre- Diabetes.  Newly diagnosed 2.  Blood Pressure Control.  good 3.  Lipid Control.  At goals with Crestor  Recommendations: 1.  1800 calorie, carbohydrate counting diet.  Patient is counseled extensively on carbohydrate counting, serving sizes, saturated fat intake and meal planning.  Patient is instructed to eat 3 meals a day and 3 small snacks.  Goal is 45 to 50 Grams of CHO per meal and 15 to 20 grams per snack. 2.  30 minutes of physical activity.  Patient is counseled to always carry glucose tablets, lifesavers, hard candies, etc., while exercising in case of hypoglycemic event. 3.  Patient is counseled on pathophysiology of pre diabetes and that aggressive TLC can sometime prevent developing DM.   Fasting blood glucose goals are less than 100 mg/dL.    A1C goals < 6.4%. 4.  LDL goal of < 100, HDL > 40 and TG < 150; BP goal < 140/90 5.  RTC in 2 months to recheck labs  Time spent counseling patient:  60 minutes   Referring Alianis Trimmer:  Redge Gainer PharmD:  Cherre Robins, PHARMD , CPP,  CDE

## 2014-03-18 NOTE — Patient Instructions (Signed)
Hemoglobin A1c Test The hemoglobin (Hb)A1c test measures the average amount of sugar (glucose) in your blood during the 2-3 months just before the test (rather than measuring the current amount of glucose in the sample of blood). Some of the glucose that circulates in your blood binds to blood proteins. Hb is a blood protein that carries oxygen in the red blood cells (RBCs). When glucose binds to Hb, the glucose-coated Hb is called glycated Hb. Once Hb is glycated, it remains that way for the life of the RBC (about 120 days). The main form of normal Hb in adults is HbA1c. The most common form of glycated HbA1c in adults is called HbA1c. Increased levels of glucose in the blood increase the percentage of HbA1c. HbA1c levels do not change quickly but will shift as RBCs are replaced. The HbA1c test is used to diagnose diabetes mellitus and to monitor long-term control of blood sugar in people who have diabetes mellitus. The HbA1c test can be used in place of or in combination with fasting blood glucose level and oral glucose tolerance tests. There are several methods for measuring the concentration of HbA1c in the blood. One method uses antibodies that bind specifically to HbA1c. A lab instrument then measures the concentration of bound antibodies in a sample. A second method called ion exchange high-pressure liquid chromatography separates HbA1c from other types of Hb on the basis of differences in electrical charge. A third method for measuring HbA1c uses enzymes that react specifically with the glucose on HbA1c to produce a measurable color change.  Ranges for normal values may vary among different labs and hospitals. You should always check with your health care provider after having lab work or other tests done to discuss the meaning of your test results and whether your values are considered within normal limits. The range for normal HbA1c test results is from 4.5% to less than 5.7%.   Meaning of Results  Outside Normal Value Ranges Abnormally high HbA1c values are most commonly an indication of prediabetes mellitus and diabetes mellitus:  An HbA1c result of 5.7-6.4% is considered diagnostic of prediabetes mellitus. (Your A1c was 6.4%)  An HbA1c result of 6.5% or higher on two separate occasions is considered diagnostic of diabetes mellitus. There are certain conditions that can cause a falsely low HbA1c test result. These conditions include pregnancy, blood loss, blood transfusions, anemia caused by premature destruction of red blood cells (hemolytic anemia), and certain unusual forms of Hb (Hb variants), such as sickle cell trait.    Exercise to Lose Weight Exercise and a healthy diet may help you lose weight. Your doctor may suggest specific exercises. EXERCISE IDEAS AND TIPS  Choose low-cost things you enjoy doing, such as walking, bicycling, or exercising to workout videos.  Take stairs instead of the elevator.  Walk during your lunch break.  Park your car further away from work or school.  Go to a gym or an exercise class.  Start with 5 to 10 minutes of exercise each day. Build up to 30 minutes of exercise 4 to 6 days a week.  Wear shoes with good support and comfortable clothes.  Stretch before and after working out.  Work out until you breathe harder and your heart beats faster.  Drink extra water when you exercise.  Do not do so much that you hurt yourself, feel dizzy, or get very short of breath. Exercises that burn about 150 calories:  Running 1  miles in 15 minutes.  Playing volleyball  for 45 to 60 minutes.  Washing and waxing a car for 45 to 60 minutes.  Playing touch football for 45 minutes.  Walking 1  miles in 35 minutes.  Pushing a stroller 1  miles in 30 minutes.  Playing basketball for 30 minutes.  Raking leaves for 30 minutes.  Bicycling 5 miles in 30 minutes.  Walking 2 miles in 30 minutes.  Dancing for 30 minutes.  Shoveling snow for  15 minutes.  Swimming laps for 20 minutes.  Walking up stairs for 15 minutes.  Bicycling 4 miles in 15 minutes.  Gardening for 30 to 45 minutes.  Jumping rope for 15 minutes.  Washing windows or floors for 45 to 60 minutes. Document Released: 10/19/2010 Document Revised: 12/09/2011 Document Reviewed: 10/19/2010 [facility] Patient Information 2015 [place], [place]. This information is not intended to replace advice given to you by your health care provider. Make sure you discuss any questions you have with your health care provider.

## 2014-03-22 ENCOUNTER — Ambulatory Visit: Payer: BC Managed Care – PPO | Admitting: Family

## 2014-04-14 ENCOUNTER — Other Ambulatory Visit (INDEPENDENT_AMBULATORY_CARE_PROVIDER_SITE_OTHER): Payer: BC Managed Care – PPO

## 2014-04-14 DIAGNOSIS — R972 Elevated prostate specific antigen [PSA]: Secondary | ICD-10-CM

## 2014-04-14 NOTE — Progress Notes (Signed)
Pt came in for alb orders from dr. Jeffie Pollock psa dx [phone]

## 2014-04-15 LAB — PSA, TOTAL AND FREE
PSA FREE PCT: 15.8 %
PSA, Free: 0.68 ng/mL
PSA: 4.3 ng/mL — ABNORMAL HIGH (ref 0.0–4.0)

## 2014-05-12 ENCOUNTER — Other Ambulatory Visit: Payer: Self-pay | Admitting: Family Medicine

## 2014-05-16 ENCOUNTER — Other Ambulatory Visit: Payer: Self-pay | Admitting: Family Medicine

## 2014-05-30 ENCOUNTER — Ambulatory Visit: Payer: Self-pay | Admitting: Nurse Practitioner

## 2014-07-07 ENCOUNTER — Other Ambulatory Visit (INDEPENDENT_AMBULATORY_CARE_PROVIDER_SITE_OTHER): Payer: BC Managed Care – PPO | Admitting: *Deleted

## 2014-07-07 ENCOUNTER — Ambulatory Visit (INDEPENDENT_AMBULATORY_CARE_PROVIDER_SITE_OTHER): Payer: BC Managed Care – PPO | Admitting: *Deleted

## 2014-07-07 DIAGNOSIS — Z23 Encounter for immunization: Secondary | ICD-10-CM

## 2014-08-30 ENCOUNTER — Ambulatory Visit: Payer: BC Managed Care – PPO | Admitting: Family Medicine

## 2014-08-30 ENCOUNTER — Encounter: Payer: BC Managed Care – PPO | Admitting: Family Medicine

## 2014-09-13 ENCOUNTER — Encounter: Payer: Self-pay | Admitting: Family Medicine

## 2014-09-13 ENCOUNTER — Ambulatory Visit (INDEPENDENT_AMBULATORY_CARE_PROVIDER_SITE_OTHER): Payer: BC Managed Care – PPO | Admitting: Family Medicine

## 2014-09-13 VITALS — BP 160/93 | HR 65 | Temp 97.9°F | Ht 72.0 in | Wt 230.0 lb

## 2014-09-13 DIAGNOSIS — Z0000 Encounter for general adult medical examination without abnormal findings: Secondary | ICD-10-CM

## 2014-09-13 DIAGNOSIS — R972 Elevated prostate specific antigen [PSA]: Secondary | ICD-10-CM

## 2014-09-13 DIAGNOSIS — I10 Essential (primary) hypertension: Secondary | ICD-10-CM

## 2014-09-13 DIAGNOSIS — R7309 Other abnormal glucose: Secondary | ICD-10-CM

## 2014-09-13 DIAGNOSIS — R7303 Prediabetes: Secondary | ICD-10-CM

## 2014-09-13 DIAGNOSIS — E559 Vitamin D deficiency, unspecified: Secondary | ICD-10-CM

## 2014-09-13 LAB — POCT URINALYSIS DIPSTICK
Bilirubin, UA: NEGATIVE
Glucose, UA: NEGATIVE
Ketones, UA: NEGATIVE
Leukocytes, UA: NEGATIVE
Nitrite, UA: NEGATIVE
PH UA: 5
RBC UA: NEGATIVE
Spec Grav, UA: 1.025
UROBILINOGEN UA: NEGATIVE

## 2014-09-13 LAB — POCT UA - MICROSCOPIC ONLY
CASTS, UR, LPF, POC: NEGATIVE
Crystals, Ur, HPF, POC: NEGATIVE
MUCUS UA: NEGATIVE
Yeast, UA: NEGATIVE

## 2014-09-13 NOTE — Patient Instructions (Addendum)
Continue current medications. Continue good therapeutic lifestyle changes which include good diet and exercise. Fall precautions discussed with patient. If an FOBT was given today- please return it to our front desk. If you are over 64 years old - you may need Prevnar 91 or the adult Pneumonia vaccine.  Flu Shots will be available at our office starting mid- September. Please call and schedule a FLU CLINIC APPOINTMENT.   Follow-up with urology as directed Call Dr. Tobey Grim office for them to give you the information to the vestibular clinic Try using the Flonase more regularly, year round at bedtime This winter drink plenty of fluids Return the FOBT Watch sodium intake Watch dietary supplements, Stevia seems to be okay. We will plan to give an ETT next November

## 2014-09-13 NOTE — Progress Notes (Signed)
Subjective:    Patient ID: [name], male    DOB: [birth year], 63 y.o.   MRN: [id number]  HPI Patient is here today for annual wellness exam and follow up of chronic medical problems. The patient is still having some problems with dizziness. He was seen by Dr. Jannifer Franklin in the past and was referred to the vestibular clinic and was trained in doing the Epley maneuver. This was several years ago. He would like a referral back to the vestibular clinic. We will give him Dr. Jannifer Franklin as name so he can call them and they can arrange for him to see the people in the vestibular clinic again. The dizziness is primarily lately positional related. He is also seeing Dr. Roni Bread regarding an elevated PSA and has an appointment in January for follow-up. Subsequent PSAs have been good. We will go ahead and do a rectal exam today and repeat his PSA make sure that Dr. Roni Bread gets a copy of this.          Patient Active Problem List   Diagnosis Date Noted  . Pre-diabetes 03/18/2014  . SBO (small bowel obstruction) 03/22/2012  . Hypertension   . Hyperlipidemia   . Reflux   . Vertigo, intermittent   . Other testicular hypofunction    Outpatient Encounter Prescriptions as of 09/13/2014  Medication Sig  . amLODipine (NORVASC) 5 MG tablet TAKE (1) TABLET DAILY AS DIRECTED.  Marland Kitchen aspirin 81 MG tablet Take 81 mg by mouth daily.  . Cholecalciferol (VITAMIN D) 2000 UNITS CAPS Take 1 capsule by mouth daily.    . CRESTOR 40 MG tablet TAKE 1 TABLET DAILY AS DIRECTED  . esomeprazole (NEXIUM) 40 MG capsule Take 40 mg by mouth every other day.   . metoprolol succinate (TOPROL-XL) 100 MG 24 hr tablet TAKE 1 TABLET DAILY  . [DISCONTINUED] metoprolol succinate (TOPROL-XL) 100 MG 24 hr tablet Take 50 mg by mouth daily. Take with or immediately following a meal.    Review of Systems  Constitutional: Negative.   HENT: Negative.   Eyes: Negative.   Respiratory: Negative.   Cardiovascular: Negative.   Gastrointestinal:  Negative.   Endocrine: Negative.   Genitourinary: Negative.   Musculoskeletal: Negative.   Skin: Negative.   Allergic/Immunologic: Negative.   Neurological: Positive for dizziness and light-headedness.  Hematological: Negative.   Psychiatric/Behavioral: Negative.        Objective:   Physical Exam  Constitutional: He is oriented to person, place, and time. He appears well-developed and well-nourished. No distress.  The patient is pleasant alert and cooperative.  HENT:  Head: Normocephalic and atraumatic.  Right Ear: External ear normal.  Left Ear: External ear normal.  Mouth/Throat: Oropharynx is clear and moist. No oropharyngeal exudate.  Nasal turbinate congestion bilaterally  Eyes: Conjunctivae and EOM are normal. Pupils are equal, round, and reactive to light. Right eye exhibits no discharge. Left eye exhibits no discharge. No scleral icterus.  Neck: Normal range of motion. Neck supple. No thyromegaly present.  Cardiovascular: Normal rate, regular rhythm, normal heart sounds and intact distal pulses.   No murmur heard. The heart has a regular rate and rhythm at 72/m  Pulmonary/Chest: Effort normal and breath sounds normal. No respiratory distress. He has no wheezes. He has no rales. He exhibits no tenderness.  Abdominal: Soft. Bowel sounds are normal. He exhibits no mass. There is no tenderness. There is no rebound and no guarding.  No masses or tenderness  Genitourinary: Rectum normal and penis normal.  The  prostate is slightly enlarged and soft without masses there are no rectal masses. There is no inguinal hernia. The external genitalia are within normal limits. There are no inguinal nodes.  Musculoskeletal: Normal range of motion. He exhibits no edema or tenderness.  Lymphadenopathy:    He has no cervical adenopathy.  Neurological: He is alert and oriented to person, place, and time. He has normal reflexes. No cranial nerve deficit.  Skin: Skin is warm and dry. No rash  noted. No erythema. No pallor.  Psychiatric: He has a normal mood and affect. His behavior is normal. Judgment and thought content normal.  Nursing note and vitals reviewed.   BP 160/90 mmHg  Pulse 65  Temp(Src) 97.9 F (36.6 C) (Oral)  Ht 6' (1.829 m)  Wt 230 lb (104.327 kg)  BMI 31.19 kg/m2       Assessment & Plan:  1. Pre-diabetes - POCT CBC - BMP8+EGFR  2. Hypertension - POCT CBC - BMP8+EGFR - Hepatic function panel  3. PSA elevation - POCT CBC - PSA, total and free - POCT UA - Microscopic Only - POCT urinalysis dipstick - Urine culture  4. Vitamin D deficiency - POCT CBC - Vit D  25 hydroxy (rtn osteoporosis monitoring)  5. Annual physical exam - POCT CBC - BMP8+EGFR - Hepatic function panel - NMR, lipoprofile - Vit D  25 hydroxy (rtn osteoporosis monitoring) - PSA, total and free - POCT UA - Microscopic Only - POCT urinalysis dipstick - Urine culture  Patient Instructions  Continue current medications. Continue good therapeutic lifestyle changes which include good diet and exercise. Fall precautions discussed with patient. If an FOBT was given today- please return it to our front desk. If you are over 9 years old - you may need Prevnar 25 or the adult Pneumonia vaccine.  Flu Shots will be available at our office starting mid- September. Please call and schedule a FLU CLINIC APPOINTMENT.   Follow-up with urology as directed Call Dr. Tobey Grim office for them to give you the information to the vestibular clinic Try using the Flonase more regularly, year round at bedtime This winter drink plenty of fluids Return the FOBT Watch sodium intake Watch dietary supplements, Stevia seems to be okay. We will plan to give an ETT next November   Arrie Senate MD

## 2014-09-14 ENCOUNTER — Other Ambulatory Visit: Payer: BC Managed Care – PPO

## 2014-09-14 DIAGNOSIS — Z1212 Encounter for screening for malignant neoplasm of rectum: Secondary | ICD-10-CM

## 2014-09-14 LAB — CBC WITH DIFFERENTIAL
BASOS ABS: 0.1 10*3/uL (ref 0.0–0.2)
BASOS: 1 %
Eos: 1 %
Eosinophils Absolute: 0.1 10*3/uL (ref 0.0–0.4)
HEMATOCRIT: 45.1 % (ref 37.5–51.0)
HEMOGLOBIN: 15.3 g/dL (ref 12.6–17.7)
Immature Grans (Abs): 0 10*3/uL (ref 0.0–0.1)
Immature Granulocytes: 0 %
LYMPHS ABS: 1.7 10*3/uL (ref 0.7–3.1)
Lymphs: 31 %
MCH: 30.2 pg (ref 26.6–33.0)
MCHC: 33.9 g/dL (ref 31.5–35.7)
MCV: 89 fL (ref 79–97)
MONOCYTES: 7 %
Monocytes Absolute: 0.4 10*3/uL (ref 0.1–0.9)
NEUTROS ABS: 3.3 10*3/uL (ref 1.4–7.0)
Neutrophils Relative %: 60 %
Platelets: 195 10*3/uL (ref 150–379)
RBC: 5.06 x10E6/uL (ref 4.14–5.80)
RDW: 14 % (ref 12.3–15.4)
WBC: 5.4 10*3/uL (ref 3.4–10.8)

## 2014-09-14 LAB — URINE CULTURE: Organism ID, Bacteria: NO GROWTH

## 2014-09-14 NOTE — Progress Notes (Signed)
Lab only specimen drop off 

## 2014-09-15 LAB — HEPATIC FUNCTION PANEL
ALBUMIN: 4.7 g/dL (ref 3.6–4.8)
ALT: 18 IU/L (ref 0–44)
AST: 17 IU/L (ref 0–40)
Alkaline Phosphatase: 128 IU/L — ABNORMAL HIGH (ref 39–117)
BILIRUBIN DIRECT: 0.25 mg/dL (ref 0.00–0.40)
BILIRUBIN TOTAL: 0.7 mg/dL (ref 0.0–1.2)
TOTAL PROTEIN: 7.6 g/dL (ref 6.0–8.5)

## 2014-09-15 LAB — BMP8+EGFR
BUN/Creatinine Ratio: 17 (ref 10–22)
BUN: 15 mg/dL (ref 8–27)
CALCIUM: 9.6 mg/dL (ref 8.6–10.2)
CO2: 28 mmol/L (ref 18–29)
Chloride: 100 mmol/L (ref 97–108)
Creatinine, Ser: 0.87 mg/dL (ref 0.76–1.27)
GFR calc Af Amer: 105 mL/min/{1.73_m2} (ref >59)
GFR, EST NON AFRICAN AMERICAN: 91 mL/min/{1.73_m2} (ref >59)
GLUCOSE: 107 mg/dL — AB (ref 65–99)
Potassium: 4.1 mmol/L (ref 3.5–5.2)
Sodium: 142 mmol/L (ref 134–144)

## 2014-09-15 LAB — NMR, LIPOPROFILE
CHOLESTEROL: 101 mg/dL (ref 100–199)
HDL Cholesterol by NMR: 53 mg/dL (ref >39)
HDL Particle Number: 33.2 umol/L (ref >=30.5)
LDL Particle Number: <300 nmol/L (ref <1000)
LDL SIZE: 20.6 nm (ref >20.5)
LDL-C: 33 mg/dL (ref 0–99)
LP-IR Score: 34 (ref <=45)
TRIGLYCERIDES BY NMR: 76 mg/dL (ref 0–149)

## 2014-09-15 LAB — PSA, TOTAL AND FREE
PSA, Free Pct: 15.6 %
PSA, Free: 0.84 ng/mL
PSA: 5.4 ng/mL — ABNORMAL HIGH (ref 0.0–4.0)

## 2014-09-15 LAB — VITAMIN D 25 HYDROXY (VIT D DEFICIENCY, FRACTURES): Vit D, 25-Hydroxy: 37.3 ng/mL (ref 30.0–100.0)

## 2014-09-16 LAB — FECAL OCCULT BLOOD, IMMUNOCHEMICAL: Fecal Occult Bld: NEGATIVE

## 2014-10-10 ENCOUNTER — Other Ambulatory Visit: Payer: Self-pay | Admitting: *Deleted

## 2014-10-10 ENCOUNTER — Other Ambulatory Visit (INDEPENDENT_AMBULATORY_CARE_PROVIDER_SITE_OTHER): Payer: BLUE CROSS/BLUE SHIELD

## 2014-10-10 DIAGNOSIS — IMO0001 Reserved for inherently not codable concepts without codable children: Secondary | ICD-10-CM

## 2014-10-10 DIAGNOSIS — R42 Dizziness and giddiness: Secondary | ICD-10-CM

## 2014-10-10 DIAGNOSIS — R519 Headache, unspecified: Secondary | ICD-10-CM

## 2014-10-10 DIAGNOSIS — R6889 Other general symptoms and signs: Secondary | ICD-10-CM

## 2014-10-10 DIAGNOSIS — R51 Headache: Secondary | ICD-10-CM

## 2014-10-10 DIAGNOSIS — R200 Anesthesia of skin: Secondary | ICD-10-CM

## 2014-10-10 DIAGNOSIS — R030 Elevated blood-pressure reading, without diagnosis of hypertension: Principal | ICD-10-CM

## 2014-10-10 NOTE — Progress Notes (Signed)
Lab only 

## 2014-10-10 NOTE — Progress Notes (Signed)
bp today 151 88 -ours bp today 160 94 - his

## 2014-10-11 ENCOUNTER — Telehealth: Payer: Self-pay | Admitting: *Deleted

## 2014-10-11 LAB — BMP8+EGFR
BUN/Creatinine Ratio: 20 (ref 10–22)
BUN: 19 mg/dL (ref 8–27)
CALCIUM: 9.4 mg/dL (ref 8.6–10.2)
CO2: 27 mmol/L (ref 18–29)
CREATININE: 0.94 mg/dL (ref 0.76–1.27)
Chloride: 103 mmol/L (ref 97–108)
GFR calc Af Amer: 99 mL/min/{1.73_m2} (ref >59)
GFR calc non Af Amer: 85 mL/min/{1.73_m2} (ref >59)
GLUCOSE: 110 mg/dL — AB (ref 65–99)
Potassium: 4.3 mmol/L (ref 3.5–5.2)
SODIUM: 143 mmol/L (ref 134–144)

## 2014-10-11 NOTE — Telephone Encounter (Signed)
Pt notified of results Verbalizes understanding 

## 2014-10-11 NOTE — Telephone Encounter (Signed)
-----   Message from Chipper Herb, MD sent at 10/11/2014  7:21 AM EST ----- The blood sugar slightly elevated at 110. The creatinine, the most important kidney function test is within normal limits. The electrolytes including potassium are within normal limits.

## 2014-10-14 ENCOUNTER — Telehealth: Payer: Self-pay | Admitting: Family Medicine

## 2014-10-14 ENCOUNTER — Ambulatory Visit
Admission: RE | Admit: 2014-10-14 | Discharge: 2014-10-14 | Disposition: A | Discharge status: Home / Self Care | Payer: BLUE CROSS/BLUE SHIELD | Source: Ambulatory Visit | Attending: Family Medicine | Admitting: Family Medicine

## 2014-10-14 DIAGNOSIS — R42 Dizziness and giddiness: Secondary | ICD-10-CM

## 2014-10-14 DIAGNOSIS — R519 Headache, unspecified: Secondary | ICD-10-CM

## 2014-10-14 DIAGNOSIS — R200 Anesthesia of skin: Secondary | ICD-10-CM

## 2014-10-14 DIAGNOSIS — IMO0001 Reserved for inherently not codable concepts without codable children: Secondary | ICD-10-CM

## 2014-10-14 DIAGNOSIS — R51 Headache: Secondary | ICD-10-CM

## 2014-10-14 DIAGNOSIS — R030 Elevated blood-pressure reading, without diagnosis of hypertension: Principal | ICD-10-CM

## 2014-10-14 DIAGNOSIS — R6889 Other general symptoms and signs: Secondary | ICD-10-CM

## 2014-10-18 ENCOUNTER — Telehealth: Payer: Self-pay | Admitting: Family Medicine

## 2014-10-18 DIAGNOSIS — H8110 Benign paroxysmal vertigo, unspecified ear: Secondary | ICD-10-CM

## 2014-10-18 NOTE — Telephone Encounter (Signed)
Order for referral to Vestibular Clinic entered in [place] Pt notified

## 2014-11-08 ENCOUNTER — Other Ambulatory Visit: Payer: Self-pay | Admitting: Family Medicine

## 2014-11-21 ENCOUNTER — Other Ambulatory Visit: Payer: Self-pay | Admitting: Family Medicine

## 2014-11-28 ENCOUNTER — Telehealth: Payer: Self-pay | Admitting: Family Medicine

## 2014-11-28 DIAGNOSIS — I10 Essential (primary) hypertension: Secondary | ICD-10-CM

## 2014-11-28 NOTE — Telephone Encounter (Signed)
Please give me these readings for review I think that I saw them and made some changes with his medication.

## 2014-11-28 NOTE — Telephone Encounter (Signed)
Patient states that he brought BP reading on Friday and they are still running high. This morning it was 175/93. Please advise

## 2014-11-29 MED ORDER — LISINOPRIL 10 MG PO TABS: 10.0000 mg | ORAL_TABLET | Freq: Every day | ORAL | Status: DC

## 2014-11-29 NOTE — Telephone Encounter (Signed)
Blood pressures have been reviewed and lisinopril 10 mg was added to patient's regimen with request for him to  bring readings by in 2-3 weeks. weeks for review. He will also need a BMP at that time.

## 2014-11-29 NOTE — Telephone Encounter (Signed)
Readings on [facility] desk. Will call pt back after he has addressed

## 2014-11-29 NOTE — Telephone Encounter (Signed)
We will add bp med to what he is doing - this documentation is noted on BP readings - under media

## 2015-01-10 ENCOUNTER — Other Ambulatory Visit: Payer: Self-pay | Admitting: *Deleted

## 2015-01-10 MED ORDER — LISINOPRIL 20 MG PO TABS: 20.0000 mg | ORAL_TABLET | Freq: Every day | ORAL | Status: DC

## 2015-01-10 NOTE — Telephone Encounter (Signed)
Per BP reading (scanned to Epic) [facility] wants pt to increase lisinopril 10 mg to 20 mg daily.  He will bring in readings and get a BMP in 2 weeks. New dose sent to [facility]

## 2015-01-24 ENCOUNTER — Encounter (HOSPITAL_COMMUNITY): Payer: Self-pay

## 2015-01-24 ENCOUNTER — Emergency Department (HOSPITAL_COMMUNITY)
Admission: EM | Admit: 2015-01-24 | Discharge: 2015-01-24 | Disposition: A | Discharge status: Home / Self Care | Payer: BLUE CROSS/BLUE SHIELD | Attending: Emergency Medicine | Admitting: Emergency Medicine

## 2015-01-24 DIAGNOSIS — J301 Allergic rhinitis due to pollen: Secondary | ICD-10-CM | POA: Insufficient documentation

## 2015-01-24 DIAGNOSIS — R42 Dizziness and giddiness: Secondary | ICD-10-CM | POA: Diagnosis not present

## 2015-01-24 DIAGNOSIS — Z85828 Personal history of other malignant neoplasm of skin: Secondary | ICD-10-CM | POA: Diagnosis not present

## 2015-01-24 DIAGNOSIS — Z7982 Long term (current) use of aspirin: Secondary | ICD-10-CM | POA: Diagnosis not present

## 2015-01-24 DIAGNOSIS — K219 Gastro-esophageal reflux disease without esophagitis: Secondary | ICD-10-CM | POA: Insufficient documentation

## 2015-01-24 DIAGNOSIS — Z880 Allergy status to penicillin: Secondary | ICD-10-CM | POA: Diagnosis not present

## 2015-01-24 DIAGNOSIS — I10 Essential (primary) hypertension: Secondary | ICD-10-CM | POA: Insufficient documentation

## 2015-01-24 DIAGNOSIS — Z79899 Other long term (current) drug therapy: Secondary | ICD-10-CM | POA: Insufficient documentation

## 2015-01-24 DIAGNOSIS — Z8639 Personal history of other endocrine, nutritional and metabolic disease: Secondary | ICD-10-CM | POA: Diagnosis not present

## 2015-01-24 DIAGNOSIS — J302 Other seasonal allergic rhinitis: Secondary | ICD-10-CM

## 2015-01-24 LAB — CBG MONITORING, ED: Glucose-Capillary: 130 mg/dL — ABNORMAL HIGH (ref 70–99)

## 2015-01-24 LAB — CBC
HEMATOCRIT: 44.3 % (ref 39.0–52.0)
Hemoglobin: 15.3 g/dL (ref 13.0–17.0)
MCH: 30.7 pg (ref 26.0–34.0)
MCHC: 34.5 g/dL (ref 30.0–36.0)
MCV: 88.8 fL (ref 78.0–100.0)
PLATELETS: 157 10*3/uL (ref 150–400)
RBC: 4.99 MIL/uL (ref 4.22–5.81)
RDW: 13 % (ref 11.5–15.5)
WBC: 5.7 10*3/uL (ref 4.0–10.5)

## 2015-01-24 LAB — BASIC METABOLIC PANEL
Anion gap: 7 (ref 5–15)
BUN: 20 mg/dL (ref 6–23)
CALCIUM: 8.9 mg/dL (ref 8.4–10.5)
CHLORIDE: 106 mmol/L (ref 96–112)
CO2: 27 mmol/L (ref 19–32)
Creatinine, Ser: 0.88 mg/dL (ref 0.50–1.35)
GFR calc non Af Amer: 89 mL/min — ABNORMAL LOW (ref >90)
GLUCOSE: 134 mg/dL — AB (ref 70–99)
Potassium: 3.6 mmol/L (ref 3.5–5.1)
SODIUM: 140 mmol/L (ref 135–145)

## 2015-01-24 MED ORDER — MECLIZINE HCL 50 MG PO TABS: 50.0000 mg | ORAL_TABLET | Freq: Three times a day (TID) | ORAL | Status: DC | PRN

## 2015-01-24 MED ORDER — ACETAMINOPHEN 325 MG PO TABS
650.0000 mg | ORAL_TABLET | Freq: Once | ORAL | Status: AC
  Administered 2015-01-24: 650 mg via ORAL
  Filled 2015-01-24: qty 2

## 2015-01-24 MED ORDER — SALINE SPRAY 0.65 % NA SOLN: 1.0000 | NASAL | Status: DC | PRN

## 2015-01-24 MED ORDER — DIAZEPAM 5 MG/ML IJ SOLN
5.0000 mg | Freq: Once | INTRAMUSCULAR | Status: AC
  Administered 2015-01-24: 5 mg via INTRAVENOUS
  Filled 2015-01-24: qty 2

## 2015-01-24 NOTE — ED Notes (Signed)
Questions, concerns denied r/t dc. Pt ambulatory and a&ox4

## 2015-01-24 NOTE — ED Provider Notes (Signed)
CSN: [id number]     Arrival date & time 01/24/15  9518 History   First MD Initiated Contact with Patient 01/24/15 [phone]     Chief Complaint  Patient presents with  . Dizziness  . Hypertension     (Consider location/radiation/quality/duration/timing/severity/associated sxs/prior Treatment) HPI Comments: Patient is a 64 year old male past medical history significant for hypertension, vertigo sutured to the emergency department for evaluation of dizziness and hypertension from dentist office. Patient states he was "semi-admitted" when he woke this morning. States he drove himself to the dentist found to be hypertensive and sent to the emergency department for evaluation. Patient states dizziness hit him while he was in triage room. States symptoms are similar to previous vertigo episodes but more severe. States he had an epley maneuver performed three weeks ago for vertigo episode. Denies any headache, chest pain, shortness of breath, nausea, vomiting, numbness or weakness or speech difficulties.   Past Medical History  Diagnosis Date  . Essential hypertension, benign   . Other and unspecified hyperlipidemia   . Reflux   . Vertigo, intermittent   . Other testicular hypofunction   . Acid reflux   . Bowel obstruction   . Cancer   . Skin cancer    Past Surgical History  Procedure Laterality Date  . Hiatal hernia repair    . Lumbar disc surgery     Family History  Problem Relation Age of Onset  . Hypertension Mother   . Melanoma Mother   . Hypertension Father   . Diabetes Father    History  Substance Use Topics  . Smoking status: Never Smoker   . Smokeless tobacco: Not on file  . Alcohol Use: Yes    Review of Systems  Neurological: Positive for dizziness.  All other systems reviewed and are negative.     Allergies  Penicillins  Home Medications   Prior to Admission medications   Medication Sig Start Date End Date Taking? Authorizing Provider  amLODipine (NORVASC) 5  MG tablet TAKE (1) TABLET DAILY AS DIRECTED. 11/08/14  Yes Chipper Herb, MD  aspirin 81 MG tablet Take 81 mg by mouth daily.   Yes Historical Provider, MD  Cholecalciferol (VITAMIN D) 2000 UNITS CAPS Take 1-2 capsules by mouth daily. Takes 1 capsule daily= 2000 units; Except on Saturday and Sunday patient takes 2 capsules= 4000 units   Yes Historical Provider, MD  CRESTOR 40 MG tablet TAKE 1 TABLET DAILY AS DIRECTED Patient taking differently: take half a tablet (20mg ) every other day. 11/22/14  Yes Chipper Herb, MD  esomeprazole (NEXIUM) 20 MG capsule Take 28 mg by mouth every other day.   Yes Historical Provider, MD  lisinopril (PRINIVIL,ZESTRIL) 40 MG tablet Take 40 mg by mouth daily.   Yes Historical Provider, MD  metoprolol succinate (TOPROL-XL) 100 MG 24 hr tablet TAKE 1 TABLET DAILY 11/22/14  Yes Chipper Herb, MD  lisinopril (PRINIVIL,ZESTRIL) 20 MG tablet Take 1 tablet (20 mg total) by mouth daily. Patient not taking: Reported on 01/24/2015 01/10/15   Chipper Herb, MD  meclizine (ANTIVERT) 50 MG tablet Take 1 tablet (50 mg total) by mouth 3 (three) times daily as needed. 01/24/15   Miasha Emmons, PA-C  sodium chloride (OCEAN) 0.65 % SOLN nasal spray Place 1 spray into both nostrils as needed for congestion. 01/24/15   Brian Kocourek, PA-C   BP 133/79 mmHg  Pulse 53  Temp(Src) 97.8 F (36.6 C) (Oral)  Resp 14  SpO2 96% Physical Exam  Constitutional:  He is oriented to person, place, and time. He appears well-developed and well-nourished. No distress.  HENT:  Head: Normocephalic and atraumatic.  Right Ear: External ear normal.  Left Ear: External ear normal.  Nose: Nose normal.  Mouth/Throat: Oropharynx is clear and moist. No oropharyngeal exudate.  Eyes: Conjunctivae and EOM are normal. Pupils are equal, round, and reactive to light.  Neck: Normal range of motion. Neck supple.  Cardiovascular: Normal rate, regular rhythm, normal heart sounds and intact distal pulses.    Pulmonary/Chest: Effort normal and breath sounds normal. No respiratory distress.  Abdominal: Soft. There is no tenderness.  Neurological: He is alert and oriented to person, place, and time. He has normal strength. No cranial nerve deficit. Gait normal. GCS eye subscore is 4. GCS verbal subscore is 5. GCS motor subscore is 6.  Sensation grossly intact.  No pronator drift.  Bilateral heel-knee-shin intact.  Skin: Skin is warm and dry. He is not diaphoretic.  Nursing note and vitals reviewed.   ED Course  Procedures (including critical care time) Medications  diazepam (VALIUM) injection 5 mg (5 mg Intravenous Given 01/24/15 1000)  acetaminophen (TYLENOL) tablet 650 mg (650 mg Oral Given 01/24/15 1114)    Labs Review Labs Reviewed  BASIC METABOLIC PANEL - Abnormal; Notable for the following:    Glucose, Bld 134 (*)    GFR calc non Af Amer 89 (*)    All other components within normal limits  CBG MONITORING, ED - Abnormal; Notable for the following:    Glucose-Capillary 130 (*)    All other components within normal limits  CBC    Imaging Review No results found.   EKG Interpretation None     Patient divided a frontal headache while in ED, mild, over sinuses, no change in neuro exam.   Headache resolved, patient able to ambulate without difficulty. He states he's had sinus congestion, sneezing and rhinorrhea and is wondering if this is the source of his symptoms.  MDM   Final diagnoses:  Vertigo  Seasonal allergies   Filed Vitals:   01/24/15 1147  BP: 133/79  Pulse: 53  Temp:   Resp: 14   Afebrile, NAD, non-toxic appearing, AAOx4. No neurofocal deficits on examination. Symptoms exacerbated with head movements. EKG reviewed. Labs reviewed. Symptoms resolved with IV Valium administration. Patient able to ambulate without difficulty. Blood pressure improved on emergency department. No signs of hypertensive urgency. Symptoms likely related to patient's peripheral vertigo,  rather than central cause. Will prescribe meclizine for at-home use. Will also prescribe Ocean Spray for seasonal allergy symptoms. Advised PCP follow-up. Return precautions discussed. Patient is agreeable to plan. Patient is stable at time of discharge. Patient d/w with Dr. Gerald Dexter with plan.      Baron Sane, PA-C 01/24/15 1614  Lacretia Leigh, MD 01/26/15 1728

## 2015-01-24 NOTE — ED Notes (Signed)
Pt was walked around the ER Hallway,pt at first was "swimmie headed" and had a little a slow walking pace and was a little unsteady but as the walking continued pt became more clear headed and walking speed became a faster pace.

## 2015-01-24 NOTE — Discharge Instructions (Signed)
Please follow up with your primary care physician in 1-2 days. If you do not have one please call the [place] number listed above. Please read all discharge instructions and return precautions.    Benign Positional Vertigo Vertigo means you feel like you or your surroundings are moving when they are not. Benign positional vertigo is the most common form of vertigo. Benign means that the cause of your condition is not serious. Benign positional vertigo is more common in older adults. CAUSES  Benign positional vertigo is the result of an upset in the labyrinth system. This is an area in the middle ear that helps control your balance. This may be caused by a viral infection, head injury, or repetitive motion. However, often no specific cause is found. SYMPTOMS  Symptoms of benign positional vertigo occur when you move your head or eyes in different directions. Some of the symptoms may include:  Loss of balance and falls.  Vomiting.  Blurred vision.  Dizziness.  Nausea.  Involuntary eye movements (nystagmus). DIAGNOSIS  Benign positional vertigo is usually diagnosed by physical exam. If the specific cause of your benign positional vertigo is unknown, your caregiver may perform imaging tests, such as magnetic resonance imaging (MRI) or computed tomography (CT). TREATMENT  Your caregiver may recommend movements or procedures to correct the benign positional vertigo. Medicines such as meclizine, benzodiazepines, and medicines for nausea may be used to treat your symptoms. In rare cases, if your symptoms are caused by certain conditions that affect the inner ear, you may need surgery. HOME CARE INSTRUCTIONS   Follow your caregiver's instructions.  Move slowly. Do not make sudden body or head movements.  Avoid driving.  Avoid operating heavy machinery.  Avoid performing any tasks that would be dangerous to you or others during a vertigo episode.  Drink enough  fluids to keep your urine clear or pale yellow. SEEK IMMEDIATE MEDICAL CARE IF:   You develop problems with walking, weakness, numbness, or using your arms, hands, or legs.  You have difficulty speaking.  You develop severe headaches.  Your nausea or vomiting continues or gets worse.  You develop visual changes.  Your family or friends notice any behavioral changes.  Your condition gets worse.  You have a fever.  You develop a stiff neck or sensitivity to light. MAKE SURE YOU:   Understand these instructions.  Will watch your condition.  Will get help right away if you are not doing well or get worse. Document Released: 06/24/2006 Document Revised: 12/09/2011 Document Reviewed: 06/06/2011 [facility] Patient Information 2015 [place], [place]. This information is not intended to replace advice given to you by your health care provider. Make sure you discuss any questions you have with your health care provider.  Allergies Allergies may happen from anything your body is sensitive to. This may be food, medicines, pollens, chemicals, and nearly anything around you in everyday life that produces allergens. An allergen is anything that causes an allergy producing substance. Heredity is often a factor in causing these problems. This means you may have some of the same allergies as your parents. Food allergies happen in all age groups. Food allergies are some of the most severe and life threatening. Some common food allergies are cow's milk, seafood, eggs, nuts, wheat, and soybeans. SYMPTOMS   Swelling around the mouth.  An itchy red rash or hives.  Vomiting or diarrhea.  Difficulty breathing. SEVERE ALLERGIC REACTIONS ARE LIFE-THREATENING. This reaction is called anaphylaxis. It can cause the  mouth and throat to swell and cause difficulty with breathing and swallowing. In severe reactions only a trace amount of food (for example, peanut oil in a salad) may cause death within  seconds. Seasonal allergies occur in all age groups. These are seasonal because they usually occur during the same season every year. They may be a reaction to molds, grass pollens, or tree pollens. Other causes of problems are house dust mite allergens, pet dander, and mold spores. The symptoms often consist of nasal congestion, a runny itchy nose associated with sneezing, and tearing itchy eyes. There is often an associated itching of the mouth and ears. The problems happen when you come in contact with pollens and other allergens. Allergens are the particles in the air that the body reacts to with an allergic reaction. This causes you to release allergic antibodies. Through a chain of events, these eventually cause you to release histamine into the blood stream. Although it is meant to be protective to the body, it is this release that causes your discomfort. This is why you were given anti-histamines to feel better. If you are unable to pinpoint the offending allergen, it may be determined by skin or blood testing. Allergies cannot be cured but can be controlled with medicine. Hay fever is a collection of all or some of the seasonal allergy problems. It may often be treated with simple over-the-counter medicine such as diphenhydramine. Take medicine as directed. Do not drink alcohol or drive while taking this medicine. Check with your caregiver or package insert for child dosages. If these medicines are not effective, there are many new medicines your caregiver can prescribe. Stronger medicine such as nasal spray, eye drops, and corticosteroids may be used if the first things you try do not work well. Other treatments such as immunotherapy or desensitizing injections can be used if all else fails. Follow up with your caregiver if problems continue. These seasonal allergies are usually not life threatening. They are generally more of a nuisance that can often be handled using medicine. HOME CARE INSTRUCTIONS    If unsure what causes a reaction, keep a diary of foods eaten and symptoms that follow. Avoid foods that cause reactions.  If hives or rash are present:  Take medicine as directed.  You may use an over-the-counter antihistamine (diphenhydramine) for hives and itching as needed.  Apply cold compresses (cloths) to the skin or take baths in cool water. Avoid hot baths or showers. Heat will make a rash and itching worse.  If you are severely allergic:  Following a treatment for a severe reaction, hospitalization is often required for closer follow-up.  Wear a medic-alert bracelet or necklace stating the allergy.  You and your family must learn how to give adrenaline or use an anaphylaxis kit.  If you have had a severe reaction, always carry your anaphylaxis kit or EpiPen with you. Use this medicine as directed by your caregiver if a severe reaction is occurring. Failure to do so could have a fatal outcome. SEEK MEDICAL CARE IF:  You suspect a food allergy. Symptoms generally happen within 30 minutes of eating a food.  Your symptoms have not gone away within 2 days or are getting worse.  You develop new symptoms.  You want to retest yourself or your child with a food or drink you think causes an allergic reaction. Never do this if an anaphylactic reaction to that food or drink has happened before. Only do this under the care of a caregiver.  SEEK IMMEDIATE MEDICAL CARE IF:   You have difficulty breathing, are wheezing, or have a tight feeling in your chest or throat.  You have a swollen mouth, or you have hives, swelling, or itching all over your body.  You have had a severe reaction that has responded to your anaphylaxis kit or an EpiPen. These reactions may return when the medicine has worn off. These reactions should be considered life threatening. MAKE SURE YOU:   Understand these instructions.  Will watch your condition.  Will get help right away if you are not doing  well or get worse. Document Released: 12/10/2002 Document Revised: 01/11/2013 Document Reviewed: 05/16/2008 [facility] Patient Information 2015 [place], [place]. This information is not intended to replace advice given to you by your health care provider. Make sure you discuss any questions you have with your health care provider.

## 2015-01-24 NOTE — ED Notes (Signed)
Patient states he was "swimmy headed" when he woke up this am. Patient states he went to the dentist, he was found to be hypertensive and was sent to the ER. Patient ambulated into ER and into triage without difficulty. NT exited patient room after obtaining vital signs and patient began screaming. This nurse entered patient room and patient c/o severe dizziness and stated "I don't know if I am having a seizure of what". Patient taken to RES B. On arrival to RES B, patient states "I have that inner ear thing", clarifies to explain it's vertigo.

## 2015-01-26 ENCOUNTER — Ambulatory Visit (INDEPENDENT_AMBULATORY_CARE_PROVIDER_SITE_OTHER): Payer: BLUE CROSS/BLUE SHIELD | Admitting: *Deleted

## 2015-01-26 VITALS — BP 145/86 | HR 67 | Temp 98.2°F | Ht 72.0 in | Wt 228.0 lb

## 2015-01-26 DIAGNOSIS — R7309 Other abnormal glucose: Secondary | ICD-10-CM | POA: Diagnosis not present

## 2015-01-26 DIAGNOSIS — R739 Hyperglycemia, unspecified: Secondary | ICD-10-CM

## 2015-01-26 DIAGNOSIS — I10 Essential (primary) hypertension: Secondary | ICD-10-CM

## 2015-01-26 LAB — GLUCOSE, POCT (MANUAL RESULT ENTRY): POC GLUCOSE: 135 mg/dL — AB (ref 70–99)

## 2015-01-26 LAB — POCT GLYCOSYLATED HEMOGLOBIN (HGB A1C): Hemoglobin A1C: 6.2

## 2015-01-26 NOTE — Progress Notes (Signed)
Patient came in today complaining with severe dizziness. Patient had take his BP at [facility] and had a reading of 180/103. Patients readings here in the office are BP 145/86 mmHg  Pulse 67  Temp(Src) 98.2 F (36.8 C) (Oral)  Ht 6' (1.829 m)  Wt 228 lb (103.42 kg)  BMI 30.92 kg/m2. Patient had been in the ER on 4/26 and was diagnosed with vertigo and was given meclizine but at this point he had not started the medication. I spoke with Dr. Laurance Flatten and he recommended we put a 24 hour bp machine on and have him go home and try taking the meclizine and follow up with him on Monday. Patient did state that he noticed that he started feeling bad after he started the lisinopril and then it did get worse after the increase of lisinopril. Dr. Laurance Flatten wants him to continue the lisinopril until Monday and try the meclizine to see if it helps. Patient had  24 hour BP monitor put on while here in the office and will return tomorrow to have it taken off. Patient verbalizes understanding and will call the office if his symptoms get worse.

## 2015-01-26 NOTE — Patient Instructions (Signed)

## 2015-01-30 ENCOUNTER — Ambulatory Visit (INDEPENDENT_AMBULATORY_CARE_PROVIDER_SITE_OTHER): Payer: BLUE CROSS/BLUE SHIELD | Admitting: Family Medicine

## 2015-01-30 ENCOUNTER — Encounter: Payer: Self-pay | Admitting: Family Medicine

## 2015-01-30 VITALS — BP 142/83 | HR 65 | Temp 98.5°F | Ht 72.0 in | Wt 235.0 lb

## 2015-01-30 DIAGNOSIS — R7309 Other abnormal glucose: Secondary | ICD-10-CM | POA: Diagnosis not present

## 2015-01-30 DIAGNOSIS — R42 Dizziness and giddiness: Secondary | ICD-10-CM

## 2015-01-30 DIAGNOSIS — R7303 Prediabetes: Secondary | ICD-10-CM

## 2015-01-30 MED ORDER — GLUCOSE BLOOD VI STRP: ORAL_STRIP | Status: DC

## 2015-01-30 MED ORDER — ONETOUCH ULTRASOFT LANCETS MISC: Status: DC

## 2015-01-30 NOTE — Progress Notes (Signed)
   Subjective:    Patient ID: [name], male    DOB: Aug 22, 1951, 64 y.o.   MRN: [id number]  HPI Patient here today for hypertension and dizziness. He is taking medications regularly.        Patient Active Problem List   Diagnosis Date Noted  . Pre-diabetes 03/18/2014  . SBO (small bowel obstruction) 03/22/2012  . Hypertension   . Hyperlipidemia   . Reflux   . Vertigo, intermittent   . Other testicular hypofunction    Outpatient Encounter Prescriptions as of 01/30/2015  . Order #: [id number] Class: Normal  . Order #: [id number] Class: Historical Med  . Order #: [id number] Class: Historical Med  . Order #: [id number] Class: Normal  . Order #: [id number] Class: Historical Med  . Order #: [id number] Class: Normal  . Order #: [id number] Class: Historical Med  . Order #: [id number] Class: Normal  . Order #: [id number] Class: Print  . [DISCONTINUED] Order #: [id number] Class: Print    Review of Systems  Constitutional: Negative.   HENT: Negative.   Eyes: Negative.   Respiratory: Negative.   Cardiovascular: Negative.   Gastrointestinal: Negative.   Endocrine: Negative.   Genitourinary: Negative.   Musculoskeletal: Negative.   Skin: Negative.   Allergic/Immunologic: Negative.   Neurological: Positive for dizziness.  Hematological: Negative.   Psychiatric/Behavioral: Negative.        Objective:   Physical Exam  Constitutional: He is oriented to person, place, and time. He appears well-developed and well-nourished. No distress.  HENT:  Head: Normocephalic and atraumatic.  Right Ear: External ear normal.  Left Ear: External ear normal.  Nose: Nose normal.  Mouth/Throat: Oropharynx is clear and moist. No oropharyngeal exudate.  Eyes: Conjunctivae and EOM are normal. Pupils are equal, round, and reactive to light. Right eye exhibits no discharge. Left eye exhibits no discharge. No scleral icterus.  Neck: Normal range of motion. Neck supple. No thyromegaly present.    Cardiovascular: Normal rate, regular rhythm and normal heart sounds.  Exam reveals no friction rub.   No murmur heard. At 72/m  Pulmonary/Chest: Effort normal and breath sounds normal. No respiratory distress. He has no wheezes. He has no rales. He exhibits no tenderness.  Abdominal: Bowel sounds are normal. He exhibits no mass.  Musculoskeletal: Normal range of motion. He exhibits no edema.  Lymphadenopathy:    He has no cervical adenopathy.  Neurological: He is alert and oriented to person, place, and time.  Skin: Skin is warm and dry. No rash noted.  Psychiatric: He has a normal mood and affect. His behavior is normal. Judgment and thought content normal.  Nursing note and vitals reviewed.  BP 142/83 mmHg  Pulse 65  Temp(Src) 98.5 F (36.9 C) (Oral)  Ht 6' (1.829 m)  Wt 235 lb (106.595 kg)  BMI 31.86 kg/m2  See 24-hour blood pressure monitor report      Assessment & Plan:  1. Dizziness - Ambulatory referral to ENT  Patient Instructions  We will arrange for you to have a visit with the ear nose and throat specialist to further evaluate your dizziness which seems to be positional related Please take a copy of your recent lab work and the 24-hour blood pressure monitor with you when you go to see the specialist Continue to monitor blood sugars and blood pressures at home and bring these with you to the next visit   Arrie Senate MD

## 2015-01-30 NOTE — Patient Instructions (Addendum)
We will arrange for you to have a visit with the ear nose and throat specialist to further evaluate your dizziness which seems to be positional related Please take a copy of your recent lab work and the 24-hour blood pressure monitor with you when you go to see the specialist Continue to monitor blood sugars and blood pressures at home and bring these with you to the next visit

## 2015-01-30 NOTE — Addendum Note (Signed)
Addended by: Zannie Cove on: 01/30/2015 05:41 PM   Modules accepted: Orders

## 2015-02-04 ENCOUNTER — Other Ambulatory Visit: Payer: Self-pay | Admitting: Family Medicine

## 2015-03-16 ENCOUNTER — Encounter: Payer: Self-pay | Admitting: Family Medicine

## 2015-03-16 ENCOUNTER — Ambulatory Visit (INDEPENDENT_AMBULATORY_CARE_PROVIDER_SITE_OTHER): Payer: BLUE CROSS/BLUE SHIELD | Admitting: Family Medicine

## 2015-03-16 VITALS — BP 137/73 | HR 67 | Temp 97.3°F | Ht 72.0 in | Wt 235.0 lb

## 2015-03-16 DIAGNOSIS — I10 Essential (primary) hypertension: Secondary | ICD-10-CM

## 2015-03-16 DIAGNOSIS — H8110 Benign paroxysmal vertigo, unspecified ear: Secondary | ICD-10-CM | POA: Diagnosis not present

## 2015-03-16 DIAGNOSIS — E559 Vitamin D deficiency, unspecified: Secondary | ICD-10-CM | POA: Diagnosis not present

## 2015-03-16 DIAGNOSIS — E785 Hyperlipidemia, unspecified: Secondary | ICD-10-CM | POA: Diagnosis not present

## 2015-03-16 DIAGNOSIS — R7309 Other abnormal glucose: Secondary | ICD-10-CM

## 2015-03-16 DIAGNOSIS — R7303 Prediabetes: Secondary | ICD-10-CM

## 2015-03-16 LAB — POCT CBC
GRANULOCYTE PERCENT: 66.2 % (ref 37–80)
HCT, POC: 44.6 % (ref 43.5–53.7)
Hemoglobin: 15 g/dL (ref 14.1–18.1)
LYMPH, POC: 1.6 (ref 0.6–3.4)
MCH, POC: 30.3 pg (ref 27–31.2)
MCHC: 33.7 g/dL (ref 31.8–35.4)
MCV: 89.8 fL (ref 80–97)
MPV: 7.8 fL (ref 0–99.8)
POC GRANULOCYTE: 4.1 (ref 2–6.9)
POC LYMPH %: 26.6 % (ref 10–50)
Platelet Count, POC: 170 10*3/uL (ref 142–424)
RBC: 4.96 M/uL (ref 4.69–6.13)
RDW, POC: 13.1 %
WBC: 6.2 10*3/uL (ref 4.6–10.2)

## 2015-03-16 LAB — POCT GLYCOSYLATED HEMOGLOBIN (HGB A1C): Hemoglobin A1C: 6.1

## 2015-03-16 NOTE — Patient Instructions (Addendum)
Continue current medications. Continue good therapeutic lifestyle changes which include good diet and exercise. Fall precautions discussed with patient. If an FOBT was given today- please return it to our front desk. If you are over 66 years old - you may need Prevnar 43 or the adult Pneumonia vaccine.  Flu Shots are still available at our office. If you still haven't had one please call to set up a nurse visit to get one.   After your visit with [place] today you will receive a survey in the mail or online from [employer] regarding your care with [place]. Please take a moment to fill this out. Your feedback is very important to [place] as you can help [place] better understand your patient needs as well as improve your experience and satisfaction. WE CARE ABOUT YOU!!!   We will call you with your lab work results as soon as those results become available. If the dizziness or vertigo recurs. Plan to go and set up an appointment with the ear nose and throat people for further help with the Epley maneuver This summer continue to drink lots of fluids and stay well hydrated Continue to work on sodium intake and weight loss

## 2015-03-16 NOTE — Progress Notes (Signed)
Subjective:    Patient ID: [name], male    DOB: 14-Jul-1951, 64 y.o.   MRN: [id number]  HPI Pt here for follow up and management of chronic medical problems which includes hypertension and hyperlipidemia. He is taking medications regularly. The patient just got back from a vacation and he is doing well. He is in her ear and dizziness issues seem to have improved. He has no complaints today. She denies chest pain, shortness of breath trouble swallowing blood in the stool black tarry bowel movements musculoskeletal symptoms or vision problems. He has been checking his blood pressures at home and gets readings in the 130s over the 80s. As far as his vertigo is concerned he never got back to see the ear nose and throat specialist regarding the Epley maneuver. He is going to wait to do this and see if the dizziness recurs before he makes this appointment. He will be in need of an exercise treadmill test this fall.       Patient Active Problem List   Diagnosis Date Noted  . Pre-diabetes 03/18/2014  . SBO (small bowel obstruction) 03/22/2012  . Hypertension   . Hyperlipidemia   . Reflux   . Vertigo, intermittent   . Other testicular hypofunction    Outpatient Encounter Prescriptions as of 03/16/2015  Medication Sig  . amLODipine (NORVASC) 5 MG tablet TAKE (1) TABLET DAILY AS DIRECTED. (Patient taking differently: TAKE (1) TABLET DAILY)  . aspirin 81 MG tablet Take 81 mg by mouth daily.  . Cholecalciferol (VITAMIN D) 2000 UNITS CAPS Take 1-2 capsules by mouth daily. Takes 1 capsule daily= 2000 units; Except on Saturday and Sunday patient takes 2 capsules= 4000 units  . CRESTOR 40 MG tablet TAKE 1 TABLET DAILY AS DIRECTED (Patient taking differently: take half a tablet ($RemoveB'20mg'KhgTzxDS$ ) every other day.)  . esomeprazole (NEXIUM) 20 MG capsule Take 28 mg by mouth every other day.  . lisinopril (PRINIVIL,ZESTRIL) 20 MG tablet Take 1 tablet (20 mg total) by mouth daily.  . metoprolol succinate  (TOPROL-XL) 100 MG 24 hr tablet TAKE 1 TABLET DAILY  . meclizine (ANTIVERT) 25 MG tablet   . sodium chloride (OCEAN) 0.65 % SOLN nasal spray Place 1 spray into both nostrils as needed for congestion. (Patient not taking: Reported on 03/16/2015)  . [DISCONTINUED] glucose blood test strip Check BS daily and PRN.dx. R73.09  . [DISCONTINUED] Lancets (ONETOUCH ULTRASOFT) lancets Check BS daily and PRN.dx. R73.09   No facility-administered encounter medications on file as of 03/16/2015.     Review of Systems  Constitutional: Negative.   HENT: Negative.   Eyes: Negative.   Respiratory: Negative.   Cardiovascular: Negative.   Gastrointestinal: Negative.   Endocrine: Negative.   Genitourinary: Negative.   Musculoskeletal: Negative.   Skin: Negative.   Allergic/Immunologic: Negative.   Neurological: Negative.   Hematological: Negative.   Psychiatric/Behavioral: Negative.        Objective:   Physical Exam  Constitutional: He is oriented to person, place, and time. He appears well-developed and well-nourished. No distress.  HENT:  Head: Normocephalic and atraumatic.  Right Ear: External ear normal.  Left Ear: External ear normal.  Nose: Nose normal.  Mouth/Throat: Oropharynx is clear and moist. No oropharyngeal exudate.  Eyes: Conjunctivae and EOM are normal. Pupils are equal, round, and reactive to light. Right eye exhibits no discharge. Left eye exhibits no discharge. No scleral icterus.  Neck: Normal range of motion. Neck supple. No tracheal deviation present. No thyromegaly present.  No  carotid bruits anterior cervical adenopathy or thyromegaly  Cardiovascular: Normal rate, regular rhythm, normal heart sounds and intact distal pulses.  Exam reveals no gallop and no friction rub.   No murmur heard. At 60/m  Pulmonary/Chest: Effort normal and breath sounds normal. No respiratory distress. He has no wheezes. He has no rales. He exhibits no tenderness.  Abdominal: Soft. Bowel sounds are  normal. He exhibits no mass. There is no tenderness. There is no rebound and no guarding.  Obesity without masses or tenderness  Musculoskeletal: Normal range of motion. He exhibits no edema or tenderness.  Lymphadenopathy:    He has no cervical adenopathy.  Neurological: He is alert and oriented to person, place, and time. He has normal reflexes. No cranial nerve deficit.  Skin: Skin is warm and dry. No rash noted. No erythema. No pallor.  Psychiatric: He has a normal mood and affect. His behavior is normal. Judgment and thought content normal.  Nursing note and vitals reviewed.  BP 137/73 mmHg  Pulse 67  Temp(Src) 97.3 F (36.3 C) (Oral)  Ht 6' (1.829 m)  Wt 235 lb (106.595 kg)  BMI 31.86 kg/m2        Assessment & Plan:  1. Essential hypertension -The blood pressure appears to be more stable and the patient will continue with his current treatment and watching sodium intake and pursuing with continued weight loss -He will bring blood pressure readings to the next visit. - POCT CBC - BMP8+EGFR - Hepatic function panel  2. Vitamin D deficiency -He will continue with current vitamin D placement pending results of lab work - POCT CBC - Vit D  25 hydroxy (rtn osteoporosis monitoring)  3. Pre-diabetes -He will continue with aggressive diet habits exercise and monitoring his blood sugar periodically at home and he will bring these readings to the next visit. - POCT CBC - BMP8+EGFR  4. Hyperlipidemia -Continue with Crestor pending results of lab work - POCT CBC - NMR, lipoprofile  5. Benign paroxysmal positional vertigo, unspecified laterality -He is doing well with this at the present time but will arrange to see the ear nose and throat specialist for more training with the Epley maneuver if this recurs. He will also historically try to see if it's associated with the spring and fall allergy seasons.  Patient Instructions  Continue current medications. Continue good  therapeutic lifestyle changes which include good diet and exercise. Fall precautions discussed with patient. If an FOBT was given today- please return it to our front desk. If you are over 3 years old - you may need Prevnar 71 or the adult Pneumonia vaccine.  Flu Shots are still available at our office. If you still haven't had one please call to set up a nurse visit to get one.   After your visit with [place] today you will receive a survey in the mail or online from [employer] regarding your care with [place]. Please take a moment to fill this out. Your feedback is very important to [place] as you can help [place] better understand your patient needs as well as improve your experience and satisfaction. WE CARE ABOUT YOU!!!   We will call you with your lab work results as soon as those results become available. If the dizziness or vertigo recurs. Plan to go and set up an appointment with the ear nose and throat people for further help with the Epley maneuver This summer continue to drink lots of fluids and stay well hydrated Continue to work on  sodium intake and weight loss   Arrie Senate MD

## 2015-03-17 LAB — BMP8+EGFR
BUN/Creatinine Ratio: 20 (ref 10–22)
BUN: 20 mg/dL (ref 8–27)
CALCIUM: 9.3 mg/dL (ref 8.6–10.2)
CHLORIDE: 100 mmol/L (ref 97–108)
CO2: 26 mmol/L (ref 18–29)
CREATININE: 1.01 mg/dL (ref 0.76–1.27)
GFR calc Af Amer: 90 mL/min/{1.73_m2} (ref >59)
GFR calc non Af Amer: 78 mL/min/{1.73_m2} (ref >59)
GLUCOSE: 122 mg/dL — AB (ref 65–99)
POTASSIUM: 4 mmol/L (ref 3.5–5.2)
Sodium: 139 mmol/L (ref 134–144)

## 2015-03-17 LAB — NMR, LIPOPROFILE
Cholesterol: 94 mg/dL — ABNORMAL LOW (ref 100–199)
HDL Cholesterol by NMR: 46 mg/dL (ref >39)
HDL Particle Number: 31.6 umol/L (ref >=30.5)
LDL PARTICLE NUMBER: 417 nmol/L (ref <1000)
LDL SIZE: 20.4 nm (ref >20.5)
LDL-C: 30 mg/dL (ref 0–99)
LP-IR SCORE: 45 (ref <=45)
Small LDL Particle Number: 174 nmol/L (ref <=527)
Triglycerides by NMR: 91 mg/dL (ref 0–149)

## 2015-03-17 LAB — HEPATIC FUNCTION PANEL
ALT: 16 IU/L (ref 0–44)
AST: 14 IU/L (ref 0–40)
Albumin: 4.4 g/dL (ref 3.6–4.8)
Alkaline Phosphatase: 127 IU/L — ABNORMAL HIGH (ref 39–117)
Bilirubin Total: 0.6 mg/dL (ref 0.0–1.2)
Bilirubin, Direct: 0.19 mg/dL (ref 0.00–0.40)
Total Protein: 7.1 g/dL (ref 6.0–8.5)

## 2015-03-17 LAB — VITAMIN D 25 HYDROXY (VIT D DEFICIENCY, FRACTURES): VIT D 25 HYDROXY: 41.5 ng/mL (ref 30.0–100.0)

## 2015-03-20 ENCOUNTER — Other Ambulatory Visit: Payer: Self-pay | Admitting: Family Medicine

## 2015-03-22 ENCOUNTER — Ambulatory Visit (INDEPENDENT_AMBULATORY_CARE_PROVIDER_SITE_OTHER): Payer: BLUE CROSS/BLUE SHIELD | Admitting: Physician Assistant

## 2015-03-22 ENCOUNTER — Encounter: Payer: Self-pay | Admitting: Physician Assistant

## 2015-03-22 VITALS — BP 159/86 | HR 69 | Temp 98.5°F | Ht 72.0 in | Wt 236.0 lb

## 2015-03-22 DIAGNOSIS — Z85820 Personal history of malignant melanoma of skin: Secondary | ICD-10-CM

## 2015-03-22 DIAGNOSIS — D485 Neoplasm of uncertain behavior of skin: Secondary | ICD-10-CM | POA: Diagnosis not present

## 2015-03-22 NOTE — Progress Notes (Signed)
   Subjective:    Patient ID: [name], male    DOB: 15-Aug-1951, 64 y.o.   MRN: [id number]  HPI 64 y/o male with h/o Melanoma presents for new occurring lesion on posterior neck.     Review of Systems  Skin:       Enlarging lesion on back of neck. Itches at times Does not bleed. Has put Hydrocortisone on it with no relief  All other systems reviewed and are negative.      Objective:   Physical Exam  Constitutional: He appears well-developed and well-nourished.  Skin:  .4 mm Pink sclerotic papule on right posterior neck. Suspicious for BCC/SCC  2nd flesh <j.55mm colored papule superior to 1st. Will not biopsy today but possible SCC/BCC also  Verrucal flesh colored <.14mm lesion on posterior right ear. Benign appearing  Fair skin with lots of actinic changes on face and neck  Nursing note and vitals reviewed.         Assessment & Plan:  1. History of melanoma - No treatment required. Patient wanted neck up exam only   2. Neoplasm of uncertain behavior of skin Approximately .34mm lesion suspicious for BCC/SCC on posterior right neck bx'd to r/o BCC/SCC. Await path and notify patient accordingly - Add'l hyperkeratotic verrucal lesion on posterior right ear removed via shave biopsy . Benign in etiology. Was not sent for pathology  - Pathology  Will refer to Dr. [place] Crane for treatment if needed.    Continue all meds Labs pending Health Maintenance reviewed Diet and exercise encouraged RTO prn   Rollen Selders A. Benjamin Stain PA-C

## 2015-03-27 LAB — PATHOLOGY

## 2015-03-28 ENCOUNTER — Telehealth: Payer: Self-pay | Admitting: *Deleted

## 2015-03-28 NOTE — Telephone Encounter (Signed)
-----   Message from Adella Nissen, PA-C sent at 03/28/2015 10:09 AM EDT ----- Lesion biopsied on neck was basal cell carcinoma and needs to be treated. I also suspect lesion above the one biopsied is basal cell also. Patient has a dermatologist ( I think Dr. Nevada Crane) so tell patient that we can fax over results if he prefers so they can treat there and biopsy other lesion. Just let me know.   Tiffany A. Benjamin Stain PA-C

## 2015-03-28 NOTE — Telephone Encounter (Signed)
lmtcb regarding test results. 

## 2015-03-29 ENCOUNTER — Telehealth: Payer: Self-pay | Admitting: Physician Assistant

## 2015-03-29 DIAGNOSIS — C4491 Basal cell carcinoma of skin, unspecified: Secondary | ICD-10-CM

## 2015-03-29 NOTE — Telephone Encounter (Signed)
Pt notified of biopsy results Referral entered in Epic Path report sent to Dr Nevada Crane

## 2015-06-03 ENCOUNTER — Other Ambulatory Visit: Payer: Self-pay | Admitting: Family Medicine

## 2015-06-20 ENCOUNTER — Other Ambulatory Visit (INDEPENDENT_AMBULATORY_CARE_PROVIDER_SITE_OTHER): Payer: BLUE CROSS/BLUE SHIELD

## 2015-06-20 DIAGNOSIS — E785 Hyperlipidemia, unspecified: Secondary | ICD-10-CM

## 2015-06-20 DIAGNOSIS — R7303 Prediabetes: Secondary | ICD-10-CM

## 2015-06-20 DIAGNOSIS — R7309 Other abnormal glucose: Secondary | ICD-10-CM | POA: Diagnosis not present

## 2015-06-20 DIAGNOSIS — I10 Essential (primary) hypertension: Secondary | ICD-10-CM | POA: Diagnosis not present

## 2015-06-20 DIAGNOSIS — E559 Vitamin D deficiency, unspecified: Secondary | ICD-10-CM

## 2015-06-20 LAB — POCT GLYCOSYLATED HEMOGLOBIN (HGB A1C): HEMOGLOBIN A1C: 6.1

## 2015-06-21 LAB — BMP8+EGFR
BUN/Creatinine Ratio: 23 — ABNORMAL HIGH (ref 10–22)
BUN: 19 mg/dL (ref 8–27)
CALCIUM: 9.7 mg/dL (ref 8.6–10.2)
CO2: 25 mmol/L (ref 18–29)
CREATININE: 0.83 mg/dL (ref 0.76–1.27)
Chloride: 100 mmol/L (ref 97–108)
GFR calc Af Amer: 107 mL/min/{1.73_m2} (ref >59)
GFR calc non Af Amer: 92 mL/min/{1.73_m2} (ref >59)
GLUCOSE: 119 mg/dL — AB (ref 65–99)
Potassium: 4.5 mmol/L (ref 3.5–5.2)
Sodium: 141 mmol/L (ref 134–144)

## 2015-06-21 LAB — NMR, LIPOPROFILE
Cholesterol: 105 mg/dL (ref 100–199)
HDL CHOLESTEROL BY NMR: 49 mg/dL (ref >39)
HDL Particle Number: 33.9 umol/L (ref >=30.5)
LDL PARTICLE NUMBER: 450 nmol/L (ref <1000)
LDL Size: 20.1 nm (ref >20.5)
LDL-C: 35 mg/dL (ref 0–99)
LP-IR SCORE: 37 (ref <=45)
Small LDL Particle Number: 255 nmol/L (ref <=527)
Triglycerides by NMR: 105 mg/dL (ref 0–149)

## 2015-06-21 LAB — HEPATIC FUNCTION PANEL
ALBUMIN: 4.4 g/dL (ref 3.6–4.8)
ALK PHOS: 126 IU/L — AB (ref 39–117)
ALT: 19 IU/L (ref 0–44)
AST: 15 IU/L (ref 0–40)
BILIRUBIN, DIRECT: 0.2 mg/dL (ref 0.00–0.40)
Bilirubin Total: 0.6 mg/dL (ref 0.0–1.2)
TOTAL PROTEIN: 7.4 g/dL (ref 6.0–8.5)

## 2015-08-05 ENCOUNTER — Other Ambulatory Visit: Payer: Self-pay | Admitting: Family Medicine

## 2015-08-29 ENCOUNTER — Other Ambulatory Visit: Payer: Self-pay | Admitting: Family Medicine

## 2015-09-18 ENCOUNTER — Encounter: Payer: Self-pay | Admitting: Family Medicine

## 2015-09-18 ENCOUNTER — Ambulatory Visit (INDEPENDENT_AMBULATORY_CARE_PROVIDER_SITE_OTHER): Payer: BLUE CROSS/BLUE SHIELD | Admitting: Family Medicine

## 2015-09-18 VITALS — BP 145/76 | HR 65 | Temp 97.9°F | Ht 72.0 in | Wt 238.0 lb

## 2015-09-18 DIAGNOSIS — R972 Elevated prostate specific antigen [PSA]: Secondary | ICD-10-CM

## 2015-09-18 DIAGNOSIS — E785 Hyperlipidemia, unspecified: Secondary | ICD-10-CM

## 2015-09-18 DIAGNOSIS — I10 Essential (primary) hypertension: Secondary | ICD-10-CM | POA: Diagnosis not present

## 2015-09-18 DIAGNOSIS — E559 Vitamin D deficiency, unspecified: Secondary | ICD-10-CM

## 2015-09-18 DIAGNOSIS — Z23 Encounter for immunization: Secondary | ICD-10-CM | POA: Diagnosis not present

## 2015-09-18 DIAGNOSIS — Z0000 Encounter for general adult medical examination without abnormal findings: Secondary | ICD-10-CM

## 2015-09-18 LAB — POCT URINALYSIS DIPSTICK
Bilirubin, UA: NEGATIVE
Blood, UA: NEGATIVE
GLUCOSE UA: NEGATIVE
Ketones, UA: NEGATIVE
Leukocytes, UA: NEGATIVE
Nitrite, UA: NEGATIVE
Protein, UA: NEGATIVE
Urobilinogen, UA: NEGATIVE
pH, UA: 6

## 2015-09-18 LAB — POCT UA - MICROSCOPIC ONLY
Bacteria, U Microscopic: NEGATIVE
Casts, Ur, LPF, POC: NEGATIVE
Crystals, Ur, HPF, POC: NEGATIVE
EPITHELIAL CELLS, URINE PER MICROSCOPY: NEGATIVE
RBC, urine, microscopic: NEGATIVE
WBC, UR, HPF, POC: NEGATIVE
YEAST UA: NEGATIVE

## 2015-09-18 MED ORDER — METOPROLOL SUCCINATE ER 100 MG PO TB24: 100.0000 mg | ORAL_TABLET | Freq: Every day | ORAL | Status: DC

## 2015-09-18 MED ORDER — AMLODIPINE BESYLATE 5 MG PO TABS: 7.5000 mg | ORAL_TABLET | Freq: Every day | ORAL | Status: DC

## 2015-09-18 NOTE — Progress Notes (Signed)
 Subjective:    Patient ID: [name], male    DOB: 08/11/1951, 64 y.o.   MRN: [id number]  HPI Patient is here today for annual wellness exam and follow up of chronic medical problems which includes hypertension. He is taking medications regularly. The patient is doing well overall. He is concerned about a skin lesion on his face and complains of some headaches and dizziness at times. The patient also sees the urologist Dr. Wrenn. He is due to get his flu shot. The patient sees the urologist regularly and has an appointment sometime in June for a recheck. He has had problems with his PSA being elevated at times. We will not do a rectal exam today but will check his PSA. He denies chest pain or shortness of breath. He has not had any trouble swallowing including heartburn and indigestion nausea vomiting diarrhea or blood in the stool. He has a history of having a hiatal hernia repair and does have occasional issues with his stomach but has not had any recently. He's passing his water without problems and has occasional nocturia and has no problems with erectile dysfunction. He gets his eye exams done regularly. He will be given an FOBT to return and we will get his lab work done today. He does continue to have problems with labyrinthitis and in her ear and has seen an ear nose and throat specialist about this and plans to reschedule to see them because he is having more trouble with this recently.     Patient Active Problem List   Diagnosis Date Noted  . Pre-diabetes 03/18/2014  . SBO (small bowel obstruction) (HCC) 03/22/2012  . Hypertension   . Hyperlipidemia   . Reflux   . Vertigo, intermittent   . Other testicular hypofunction    Outpatient Encounter Prescriptions as of 09/18/2015  Medication Sig  . amLODipine (NORVASC) 5 MG tablet TAKE (1) TABLET DAILY AS DIRECTED.  . aspirin 81 MG tablet Take 81 mg by mouth daily.  . Cholecalciferol (VITAMIN D) 2000 UNITS CAPS Take 1-2 capsules by  mouth daily. Takes 1 capsule daily= 2000 units; Except on Saturday and Sunday patient takes 2 capsules= 4000 units  . CRESTOR 40 MG tablet TAKE 1 TABLET DAILY AS DIRECTED (Patient taking differently: take half a tablet (20mg) every other day.)  . esomeprazole (NEXIUM) 20 MG capsule Take 28 mg by mouth every other day.  . lisinopril (PRINIVIL,ZESTRIL) 20 MG tablet TAKE 1 TABLET DAILY  . meclizine (ANTIVERT) 25 MG tablet   . metoprolol succinate (TOPROL-XL) 100 MG 24 hr tablet TAKE 1 TABLET DAILY  . sodium chloride (OCEAN) 0.65 % SOLN nasal spray Place 1 spray into both nostrils as needed for congestion.   No facility-administered encounter medications on file as of 09/18/2015.      Review of Systems  Constitutional: Negative.   Eyes: Negative.   Respiratory: Negative.   Cardiovascular: Negative.   Gastrointestinal: Negative.   Endocrine: Negative.   Genitourinary: Negative.   Musculoskeletal: Negative.   Skin: Negative.        Lesion on face  Allergic/Immunologic: Negative.   Neurological: Positive for dizziness and headaches.  Hematological: Negative.   Psychiatric/Behavioral: Negative.        Objective:   Physical Exam  Constitutional: He is oriented to person, place, and time. He appears well-developed and well-nourished. No distress.  HENT:  Head: Normocephalic and atraumatic.  Right Ear: External ear normal.  Left Ear: External ear normal.  Mouth/Throat: Oropharynx is clear   and moist. No oropharyngeal exudate.  Slight nasal congestion  Eyes: Conjunctivae and EOM are normal. Pupils are equal, round, and reactive to light. Right eye exhibits no discharge. Left eye exhibits no discharge. No scleral icterus.  Neck: Normal range of motion. Neck supple. No thyromegaly present.  No carotid bruits or thyromegaly  Cardiovascular: Normal rate, regular rhythm, normal heart sounds and intact distal pulses.   No murmur heard. The heart has a regular rate and rhythm at 72/m    Pulmonary/Chest: Effort normal and breath sounds normal. No respiratory distress. He has no wheezes. He has no rales. He exhibits no tenderness.  No axillary adenopathy and lungs are clear anteriorly and posteriorly  Abdominal: Soft. Bowel sounds are normal. He exhibits no mass. There is no tenderness. There is no rebound and no guarding.  No abdominal tenderness or organ enlargement.  Genitourinary:  The prostate was not examined today as this will be done by the urologist when he sees him in the next few months.  Musculoskeletal: Normal range of motion. He exhibits no edema or tenderness.  Lymphadenopathy:    He has no cervical adenopathy.  Neurological: He is alert and oriented to person, place, and time. He has normal reflexes. No cranial nerve deficit.  Skin: Skin is warm and dry. No rash noted.  There is a slightly red and raised oval skin lesion at the bottom of the right orbit and the patient will have his dermatologist to review this soon.  Psychiatric: He has a normal mood and affect. His behavior is normal. Judgment and thought content normal.  Nursing note and vitals reviewed.    BP 145/76 mmHg  Pulse 65  Temp(Src) 97.9 F (36.6 C) (Oral)  Ht 6' (1.829 m)  Wt 238 lb (107.956 kg)  BMI 32.27 kg/m2 Repeat blood pressure 144/88 left arm sitting     Assessment & Plan:  1. Hyperlipidemia -Continue current treatment pending results of lab work - NMR, lipoprofile - CBC with Differential/Platelet  2. Annual physical exam -Flu shot today -Increase amlodipine to 7-1/2 mg daily and follow-up on blood pressure in the next 4 weeks - POCT urinalysis dipstick - POCT UA - Microscopic Only - CBC with Differential/Platelet - PSA, total and free  3. Hypertension -Increase amlodipine as directed and follow up on blood pressure - BMP8+EGFR - CBC with Differential/Platelet - Hepatic function panel  4. Vitamin D deficiency -Continue current treatment pending results of lab  work - VITAMIN D 25 Hydroxy (Vit-D Deficiency, Fractures) - CBC with Differential/Platelet  5. PSA elevation -Follow-up with urology  6. Abnormal skin lesion -Patient will call dermatology to have them look at this  Meds ordered this encounter  Medications  . metoprolol succinate (TOPROL-XL) 100 MG 24 hr tablet    Sig: Take 1 tablet (100 mg total) by mouth daily. Take with or immediately following a meal.    Dispense:  30 tablet    Refill:  6  . amLODipine (NORVASC) 5 MG tablet    Sig: Take 1.5 tablets (7.5 mg total) by mouth daily.    Dispense:  135 tablet    Refill:  3   Patient Instructions  The patient will make an appointment with his dermatologist for the  lesion on his right cheek. He will increase his blood pressure medicine, amlodipine to 7-1/2 mg daily He will continue to watch his sodium intake and try to lose as much weight as possible and drink more water Stay active physically Please complete the press   Mardelle Matte survey that comes to you in the mail after this visit Please keep appointment with urology as planned   Arrie Senate MD

## 2015-09-18 NOTE — Patient Instructions (Addendum)
The patient will make an appointment with his dermatologist for the  lesion on his right cheek. He will increase his blood pressure medicine, amlodipine to 7-1/2 mg daily He will continue to watch his sodium intake and try to lose as much weight as possible and drink more water Stay active physically Please complete the press [employer] survey that comes to you in the mail after this visit Please keep appointment with urology as planned

## 2015-09-19 ENCOUNTER — Other Ambulatory Visit: Payer: BLUE CROSS/BLUE SHIELD

## 2015-09-19 DIAGNOSIS — Z1212 Encounter for screening for malignant neoplasm of rectum: Secondary | ICD-10-CM

## 2015-09-19 LAB — PSA, TOTAL AND FREE
PSA FREE: 0.96 ng/mL
PSA, Free Pct: 15.5 %
Prostate Specific Ag, Serum: 6.2 ng/mL — ABNORMAL HIGH (ref 0.0–4.0)

## 2015-09-19 LAB — CBC WITH DIFFERENTIAL/PLATELET
BASOS: 1 %
Basophils Absolute: 0 10*3/uL (ref 0.0–0.2)
EOS (ABSOLUTE): 0.1 10*3/uL (ref 0.0–0.4)
EOS: 2 %
HEMATOCRIT: 43.5 % (ref 37.5–51.0)
Hemoglobin: 15.4 g/dL (ref 12.6–17.7)
Immature Grans (Abs): 0 10*3/uL (ref 0.0–0.1)
Immature Granulocytes: 0 %
LYMPHS ABS: 1.4 10*3/uL (ref 0.7–3.1)
Lymphs: 26 %
MCH: 31.2 pg (ref 26.6–33.0)
MCHC: 35.4 g/dL (ref 31.5–35.7)
MCV: 88 fL (ref 79–97)
MONOS ABS: 0.4 10*3/uL (ref 0.1–0.9)
Monocytes: 7 %
NEUTROS PCT: 64 %
Neutrophils Absolute: 3.4 10*3/uL (ref 1.4–7.0)
PLATELETS: 195 10*3/uL (ref 150–379)
RBC: 4.94 x10E6/uL (ref 4.14–5.80)
RDW: 13.5 % (ref 12.3–15.4)
WBC: 5.3 10*3/uL (ref 3.4–10.8)

## 2015-09-19 LAB — BMP8+EGFR
BUN / CREAT RATIO: 21 (ref 10–22)
BUN: 20 mg/dL (ref 8–27)
CO2: 27 mmol/L (ref 18–29)
CREATININE: 0.96 mg/dL (ref 0.76–1.27)
Calcium: 9.1 mg/dL (ref 8.6–10.2)
Chloride: 100 mmol/L (ref 96–106)
GFR calc Af Amer: 95 mL/min/{1.73_m2} (ref >59)
GFR, EST NON AFRICAN AMERICAN: 83 mL/min/{1.73_m2} (ref >59)
GLUCOSE: 128 mg/dL — AB (ref 65–99)
Potassium: 4.1 mmol/L (ref 3.5–5.2)
Sodium: 139 mmol/L (ref 134–144)

## 2015-09-19 LAB — NMR, LIPOPROFILE
CHOLESTEROL: 108 mg/dL (ref 100–199)
HDL Cholesterol by NMR: 48 mg/dL (ref >39)
HDL Particle Number: 35.2 umol/L (ref >=30.5)
LDL PARTICLE NUMBER: 464 nmol/L (ref <1000)
LDL SIZE: 20.7 nm (ref >20.5)
LDL-C: 45 mg/dL (ref 0–99)
LP-IR SCORE: 45 (ref <=45)
Small LDL Particle Number: 189 nmol/L (ref <=527)
TRIGLYCERIDES BY NMR: 76 mg/dL (ref 0–149)

## 2015-09-19 LAB — VITAMIN D 25 HYDROXY (VIT D DEFICIENCY, FRACTURES): Vit D, 25-Hydroxy: 43.7 ng/mL (ref 30.0–100.0)

## 2015-09-19 LAB — HEPATIC FUNCTION PANEL
ALT: 21 IU/L (ref 0–44)
AST: 15 IU/L (ref 0–40)
Albumin: 4.4 g/dL (ref 3.6–4.8)
Alkaline Phosphatase: 148 IU/L — ABNORMAL HIGH (ref 39–117)
BILIRUBIN TOTAL: 0.5 mg/dL (ref 0.0–1.2)
Bilirubin, Direct: 0.16 mg/dL (ref 0.00–0.40)
Total Protein: 7.1 g/dL (ref 6.0–8.5)

## 2015-09-21 LAB — FECAL OCCULT BLOOD, IMMUNOCHEMICAL: Fecal Occult Bld: NEGATIVE

## 2015-11-06 ENCOUNTER — Other Ambulatory Visit: Payer: Self-pay | Admitting: Urology

## 2015-11-15 ENCOUNTER — Encounter (HOSPITAL_BASED_OUTPATIENT_CLINIC_OR_DEPARTMENT_OTHER): Payer: Self-pay | Admitting: *Deleted

## 2015-11-15 NOTE — Progress Notes (Signed)
NPO AFTER MN.  ARRIVE AT 0730.  NEEDS ISTAT.  CURRENT EKG IN CHART AND EPIC.  WILL TAKE TOPROL, NORVASC, AND NEXIUM AM DOS W/ SIPS OF WATER.

## 2015-11-20 NOTE — H&P (Signed)
Active Problems Problems  1. 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 Benign enlargement of prostate (N40.0) 3. PSA elevation (R97.20)  History of Present Illness [name] returns today in f/u. He had a biopsy in January 2015 for a PSA of 4.3 that showed 2 cores of atypia. He is back now with a PSA on 09/15/15 of 6.2. His PSA was up to 5.8 early last year but was back down to 4.36 in July 2016.  He is voiding without new complaints. He has adequate stream. He has nocturia 1-2x.  He has no dysuria or hematuria. He has no associated signs or symptoms.   Past Medical History Problems  1. History of gastroesophageal reflux (GERD) (Z87.19) 2. History of hypercholesterolemia (Z86.39) 3. History of hypertension (Z86.79) 4. History of nonmelanoma skin cancer (Z85.828) 5. History of renal calculi [id number])  Surgical History Problems  1. History of Back Surgery 2. History of Biopsy Skin 3. History of Esophagogastric Fundoplasty Nissen Fundoplication  Current Meds 1. AmLODIPine Besylate 10 MG Oral Tablet;  Therapy: (Recorded:01Feb2017) to Recorded 2. Aspirin 81 MG TABS;  Therapy: (Recorded:21Nov2014) to Recorded 3. Crestor 20 MG Oral Tablet; TAKE 0.5 TABLET Daily;  Therapy: (Recorded:09Jul2015) to Recorded 4. NexIUM CPDR;  Therapy: (Recorded:21Nov2014) to Recorded 5. Toprol XL 50 MG Oral Tablet Extended Release 24 Hour;  Therapy: (Recorded:21Nov2014) to Recorded 6. Vitamin D3 2000 UNIT Oral Capsule;  Therapy: (Recorded:21Nov2014) to Recorded  Allergies Medication  1. Penicillins  Family History Problems  1. Family history of cardiac disorder (Z82.49) : Mother, Father 2. Family history of kidney stones (Z84.1) : Father 3. Family history of stroke (Z82.3) : Father 4. Family history of Presence of stent in coronary artery : Father  Social History Problems  1. Caffeine use (F15.90)   2 2. Does not use tobacco (Z78.9) 3. Married 4. Never smoker 5. Number  of children   1 son 1 daughter 67. 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  Review of Systems Genitourinary, constitutional, skin, eye, otolaryngeal, hematologic/lymphatic, cardiovascular, pulmonary, endocrine, musculoskeletal, gastrointestinal, neurological and psychiatric system(s) were reviewed and pertinent findings if present are noted and are otherwise negative.    Vitals Vital Signs [Data Includes: Last 1 Day]  Recorded: [id number] 03:22PM  Blood Pressure: 147 / 86 Temperature: 98.2 F Heart Rate: 63  Physical Exam Constitutional: Well nourished and well developed . No acute distress.  Pulmonary: No respiratory distress and normal respiratory rhythm and effort.  Cardiovascular: Heart rate and rhythm are normal . No peripheral edema.    Results/Data Urine [Data Includes: Last 1 Day]   [id number]  COLOR YELLOW   APPEARANCE CLEAR   SPECIFIC GRAVITY 1.025   pH 5.5   GLUCOSE NEGATIVE   BILIRUBIN NEGATIVE   KETONE NEGATIVE   BLOOD NEGATIVE   PROTEIN NEGATIVE   NITRITE NEGATIVE   LEUKOCYTE ESTERASE NEGATIVE    UA and outside PSA reviewed.    Assessment Assessed  1. 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 PSA elevation (R97.20)  His PSA is up some and he has prostatic atypia and needs a repeat biopsy.   Plan Atypical small acinar proliferation of prostate, PSA elevation  1. Start: LevoFLOXacin 500 MG Oral Tablet (Levaquin); Take one tablet daily starting day  before procedure 2. Follow-up Schedule Surgery Office  Follow-up  Status: Hold For - Appointment   Requested for: [id number] Health Maintenance  3. UA With REFLEX; [Do Not Release]; Status:Resulted - Requires Verification;   [id number]  03:07PM  I am going to set him up for a biopsy as an outpatient since he had pain with the office biopsy.  I have reviewed the risks of bleeding, infection and difficulty voiding.  I sent Levaquin for the prep.    Discussion/Summary CC: Dr. Morrie Sheldon.

## 2015-11-21 ENCOUNTER — Ambulatory Visit (HOSPITAL_BASED_OUTPATIENT_CLINIC_OR_DEPARTMENT_OTHER): Payer: BLUE CROSS/BLUE SHIELD | Admitting: Anesthesiology

## 2015-11-21 ENCOUNTER — Encounter (HOSPITAL_BASED_OUTPATIENT_CLINIC_OR_DEPARTMENT_OTHER)
Admission: RE | Disposition: A | Discharge status: Home / Self Care | Payer: Self-pay | Source: Ambulatory Visit | Attending: Urology

## 2015-11-21 ENCOUNTER — Encounter (HOSPITAL_BASED_OUTPATIENT_CLINIC_OR_DEPARTMENT_OTHER): Payer: Self-pay | Admitting: *Deleted

## 2015-11-21 ENCOUNTER — Ambulatory Visit (HOSPITAL_BASED_OUTPATIENT_CLINIC_OR_DEPARTMENT_OTHER)
Admission: RE | Admit: 2015-11-21 | Discharge: 2015-11-21 | Disposition: A | Discharge status: Home / Self Care | Payer: BLUE CROSS/BLUE SHIELD | Source: Ambulatory Visit | Attending: Urology | Admitting: Urology

## 2015-11-21 ENCOUNTER — Other Ambulatory Visit: Payer: Self-pay | Admitting: Family Medicine

## 2015-11-21 DIAGNOSIS — Z79899 Other long term (current) drug therapy: Secondary | ICD-10-CM | POA: Insufficient documentation

## 2015-11-21 DIAGNOSIS — I10 Essential (primary) hypertension: Secondary | ICD-10-CM | POA: Insufficient documentation

## 2015-11-21 DIAGNOSIS — Z87442 Personal history of urinary calculi: Secondary | ICD-10-CM | POA: Insufficient documentation

## 2015-11-21 DIAGNOSIS — E669 Obesity, unspecified: Secondary | ICD-10-CM | POA: Insufficient documentation

## 2015-11-21 DIAGNOSIS — Z85828 Personal history of other malignant neoplasm of skin: Secondary | ICD-10-CM | POA: Insufficient documentation

## 2015-11-21 DIAGNOSIS — R351 Nocturia: Secondary | ICD-10-CM | POA: Diagnosis not present

## 2015-11-21 DIAGNOSIS — N401 Enlarged prostate with lower urinary tract symptoms: Secondary | ICD-10-CM | POA: Insufficient documentation

## 2015-11-21 DIAGNOSIS — Z7982 Long term (current) use of aspirin: Secondary | ICD-10-CM | POA: Diagnosis not present

## 2015-11-21 DIAGNOSIS — M1990 Unspecified osteoarthritis, unspecified site: Secondary | ICD-10-CM | POA: Insufficient documentation

## 2015-11-21 DIAGNOSIS — N4232 Atypical small acinar proliferation of prostate: Secondary | ICD-10-CM | POA: Diagnosis present

## 2015-11-21 DIAGNOSIS — K449 Diaphragmatic hernia without obstruction or gangrene: Secondary | ICD-10-CM | POA: Insufficient documentation

## 2015-11-21 DIAGNOSIS — E7800 Pure hypercholesterolemia, unspecified: Secondary | ICD-10-CM | POA: Insufficient documentation

## 2015-11-21 DIAGNOSIS — K219 Gastro-esophageal reflux disease without esophagitis: Secondary | ICD-10-CM | POA: Diagnosis not present

## 2015-11-21 DIAGNOSIS — Z6831 Body mass index (BMI) 31.0-31.9, adult: Secondary | ICD-10-CM | POA: Diagnosis not present

## 2015-11-21 HISTORY — DX: Personal history of urinary calculi: Z87.442

## 2015-11-21 HISTORY — DX: Hyperlipidemia, unspecified: E78.5

## 2015-11-21 HISTORY — DX: Unspecified osteoarthritis, unspecified site: M19.90

## 2015-11-21 HISTORY — DX: Personal history of malignant melanoma of skin: Z85.820

## 2015-11-21 HISTORY — DX: Elevated prostate specific antigen (PSA): R97.20

## 2015-11-21 HISTORY — PX: PROSTATE BIOPSY: SHX241

## 2015-11-21 HISTORY — DX: Benign paroxysmal vertigo, unspecified ear: H81.10

## 2015-11-21 HISTORY — DX: Personal history of other malignant neoplasm of skin: Z98.890

## 2015-11-21 HISTORY — DX: Personal history of other diseases of the digestive system: Z87.19

## 2015-11-21 HISTORY — DX: Personal history of other malignant neoplasm of skin: Z85.828

## 2015-11-21 HISTORY — DX: Gastro-esophageal reflux disease without esophagitis: K21.9

## 2015-11-21 HISTORY — DX: Prediabetes: R73.03

## 2015-11-21 HISTORY — DX: Atypical small acinar proliferation of prostate: N42.32

## 2015-11-21 HISTORY — DX: Left anterior fascicular block: I44.4

## 2015-11-21 LAB — POCT I-STAT 4, (NA,K, GLUC, HGB,HCT)
GLUCOSE: 130 mg/dL — AB (ref 65–99)
HEMATOCRIT: 45 % (ref 39.0–52.0)
HEMOGLOBIN: 15.3 g/dL (ref 13.0–17.0)
Potassium: 3.7 mmol/L (ref 3.5–5.1)
Sodium: 143 mmol/L (ref 135–145)

## 2015-11-21 SURGERY — BIOPSY, PROSTATE
Anesthesia: General

## 2015-11-21 MED ORDER — FENTANYL CITRATE (PF) 100 MCG/2ML IJ SOLN
INTRAMUSCULAR | Status: DC | PRN
  Administered 2015-11-21: 100 ug via INTRAVENOUS

## 2015-11-21 MED ORDER — LACTATED RINGERS IV SOLN
INTRAVENOUS | Status: DC
  Administered 2015-11-21: 08:00:00 via INTRAVENOUS
  Filled 2015-11-21: qty 1000

## 2015-11-21 MED ORDER — PROPOFOL 10 MG/ML IV BOLUS
INTRAVENOUS | Status: DC | PRN
  Administered 2015-11-21: 20 mg via INTRAVENOUS

## 2015-11-21 MED ORDER — FENTANYL CITRATE (PF) 100 MCG/2ML IJ SOLN
INTRAMUSCULAR | Status: AC
  Filled 2015-11-21: qty 2

## 2015-11-21 MED ORDER — MIDAZOLAM HCL 2 MG/2ML IJ SOLN
INTRAMUSCULAR | Status: AC
  Filled 2015-11-21: qty 2

## 2015-11-21 MED ORDER — LIDOCAINE HCL (CARDIAC) 20 MG/ML IV SOLN
INTRAVENOUS | Status: DC | PRN
  Administered 2015-11-21: 50 mg via INTRAVENOUS

## 2015-11-21 MED ORDER — PROPOFOL 10 MG/ML IV BOLUS
INTRAVENOUS | Status: AC
  Filled 2015-11-21: qty 20

## 2015-11-21 MED ORDER — FLEET ENEMA 7-19 GM/118ML RE ENEM
1.0000 | ENEMA | Freq: Once | RECTAL | Status: DC
  Filled 2015-11-21: qty 1

## 2015-11-21 MED ORDER — MIDAZOLAM HCL 5 MG/5ML IJ SOLN
INTRAMUSCULAR | Status: DC | PRN
  Administered 2015-11-21: 2 mg via INTRAVENOUS

## 2015-11-21 MED ORDER — PROPOFOL 500 MG/50ML IV EMUL
INTRAVENOUS | Status: DC | PRN
  Administered 2015-11-21: 25 ug/kg/min via INTRAVENOUS

## 2015-11-21 SURGICAL SUPPLY — 5 items
GLOVE SURG SS PI 8.0 STRL IVOR (GLOVE) IMPLANT
KIT ROOM TURNOVER WOR (KITS) ×2 IMPLANT
SURGILUBE 2OZ TUBE FLIPTOP (MISCELLANEOUS) ×2 IMPLANT
TOWEL OR 17X24 6PK STRL BLUE (TOWEL DISPOSABLE) ×2 IMPLANT
UNDERPAD 30X30 INCONTINENT (UNDERPADS AND DIAPERS) ×2 IMPLANT

## 2015-11-21 NOTE — Anesthesia Procedure Notes (Signed)
Procedure Name: MAC Date/Time: 11/21/2015 9:00 AM Performed by: Bethena Roys T Oxygen Delivery Method: Nasal cannula Placement Confirmation: positive ETCO2 and CO2 detector

## 2015-11-21 NOTE — Interval H&P Note (Signed)
History and Physical Interval Note:  11/21/2015 8:58 AM  [name]  has presented today for surgery, with the diagnosis of increased PSA, Prostate Atypia  The various methods of treatment have been discussed with the patient and family. After consideration of risks, benefits and other options for treatment, the patient has consented to  Procedure(s): PROSTATE BIOPSY AND ULTRASOUND (N/A) as a surgical intervention .  The patient's history has been reviewed, patient examined, no change in status, stable for surgery.  I have reviewed the patient's chart and labs.  Questions were answered to the patient's satisfaction.     [name]

## 2015-11-21 NOTE — Brief Op Note (Signed)
11/21/2015  9:17 AM  PATIENT:  [name]  65 y.o. male  PRE-OPERATIVE DIAGNOSIS:  increased PSA, Prostate Atypia  POST-OPERATIVE DIAGNOSIS:  increased PSA, Prostate Atypia  PROCEDURE:  Procedure(s): PROSTATE BIOPSY AND ULTRASOUND (N/A)  SURGEON:  Surgeon(s) and Role:    * Irine Seal, MD - Primary  PHYSICIAN ASSISTANT:   ASSISTANTS: none   ANESTHESIA:   MAC  EBL:  Total I/O In: 100 [I.V.:100] Out: 0   BLOOD ADMINISTERED:none  DRAINS: none   LOCAL MEDICATIONS USED:  NONE  SPECIMEN:  Source of Specimen:  12 core prostate biopsy  DISPOSITION OF SPECIMEN:  PATHOLOGY  COUNTS:  YES  TOURNIQUET:  * No tourniquets in log *  DICTATION: .Other Dictation: Dictation Number [id number]  PLAN OF CARE: Discharge to home after PACU  PATIENT DISPOSITION:  PACU - hemodynamically stable.   Delay start of Pharmacological VTE agent (>24hrs) due to surgical blood loss or risk of bleeding: not applicable

## 2015-11-21 NOTE — Op Note (Signed)
[name], [name]                 ACCOUNT NO.:  [id number]  MEDICAL RECORD NO.:  [id number]  LOCATION:                                 FACILITY:  PHYSICIAN:  Marshall Cork. Jeffie Pollock, M.D.    DATE OF BIRTH:  September 27, 1951  DATE OF PROCEDURE:  11/21/2015 DATE OF DISCHARGE:                              OPERATIVE REPORT   PROCEDURE: 1. Transrectal ultrasound of the prostate. 2. Ultrasound-guided prostate biopsy.  PREOPERATIVE DIAGNOSIS:  Elevated PSA with a history of prostatic atypia.  POSTOPERATIVE DIAGNOSIS:  Elevated PSA with a history of prostatic atypia.  SURGEON:  Marshall Cork. Jeffie Pollock, MD  ANESTHESIA:  General.  SPECIMEN:  12-core prostate biopsy.  BLOOD LOSS:  None.  COMPLICATIONS:  None.  DRAINS:  None.  INDICATIONS:  [name] is a 65 year old white male with a history of an elevated PSA and a prior prostate biopsy with 2 cores of atypia.  He has had a rising PSA and it was up to 6.2, and it was felt that another biopsy was indicated.  FINDINGS AND PROCEDURE:  He had been given oral Levaquin 500 mg daily starting today prior to the procedure that will be continued for total of 3 days.  He was taken to the operating room where IV sedation was induced.  He was rolled into a left lateral decubitus position.  The 10 megahertz transrectal ultrasound probe was assembled and inserted and scanning was performed.  Inspection revealed normal seminal vesicles with the exception of some calcification of the ejaculatory ducts and vas deferens.  The prostate was 72.56 mL in volume with a length of 5.0 cm, height of 4.33 cm and a width of 6.41 cm.  There was a 1.12 cm, slightly hypoechoic nodule medially in the left base extending up toward the mid prostate.  The remainder of the peripheral zone was unremarkable.  The transitional zone had a normal variegated echotexture.  There were some scattered calcifications particularly on the left and at the junction of the adenoma and the capsule as  well as in the apical peripheral zone on the left.  No significant middle lobe was identified.  After the initial diagnostic scan, a 12 core biopsy was performed with care taken to target the lesion at the left base.  In the left medial base, once the biopsies were complete, inspection revealed no significant bleeding.  The patient was turned back supine, the sedation was stopped, and he was taken to recovery room in stable condition. There were no complications.     Marshall Cork. Jeffie Pollock, M.D.     JJW/MEDQ  D:  11/21/2015  T:  11/21/2015  Job:  [id number]

## 2015-11-21 NOTE — Transfer of Care (Signed)
Immediate Anesthesia Transfer of Care Note  Patient: [name]  Procedure(s) Performed: Procedure(s): PROSTATE BIOPSY AND ULTRASOUND (N/A)  Patient Location: PACU  Anesthesia Type:MAC  Level of Consciousness: awake, alert  and oriented  Airway & Oxygen Therapy: Patient Spontanous Breathing  Post-op Assessment: Report given to RN  Post vital signs: Reviewed and stable  Last Vitals: 111/49, 62, 12,100% Filed Vitals:   11/21/15 0740  BP: 151/82  Pulse: 69  Temp: 37 C  Resp: 16    Complications: No apparent anesthesia complications

## 2015-11-21 NOTE — Anesthesia Postprocedure Evaluation (Signed)
Anesthesia Post Note  Patient: [name]  Procedure(s) Performed: Procedure(s) (LRB): PROSTATE BIOPSY AND ULTRASOUND (N/A)  Patient location during evaluation: PACU Anesthesia Type: MAC Level of consciousness: awake and alert Pain management: pain level controlled Vital Signs Assessment: post-procedure vital signs reviewed and stable Respiratory status: spontaneous breathing, nonlabored ventilation, respiratory function stable and patient connected to nasal cannula oxygen Cardiovascular status: stable and blood pressure returned to baseline Anesthetic complications: no    Last Vitals:  Filed Vitals:   11/21/15 0945 11/21/15 1030  BP: 128/72 137/72  Pulse: 62 62  Temp:  36.6 C  Resp: 14 14    Last Pain: There were no vitals filed for this visit.               Montarius Kitagawa J

## 2015-11-21 NOTE — Anesthesia Preprocedure Evaluation (Addendum)
Anesthesia Evaluation  Patient identified by MRN, date of birth, ID band Patient awake    Reviewed: Allergy & Precautions, NPO status , Patient's Chart, lab work & pertinent test results  Airway Mallampati: II  TM Distance: >3 FB Neck ROM: Full    Dental no notable dental hx.    Pulmonary neg pulmonary ROS,    Pulmonary exam normal breath sounds clear to auscultation       Cardiovascular Exercise Tolerance: Good hypertension, Pt. on medications Normal cardiovascular exam+ dysrhythmias  Rhythm:Regular Rate:Normal     Neuro/Psych negative neurological ROS  negative psych ROS   GI/Hepatic Neg liver ROS, hiatal hernia, GERD  Medicated,  Endo/Other  negative endocrine ROS  Renal/GU negative Renal ROS  negative genitourinary   Musculoskeletal  (+) Arthritis ,   Abdominal (+) + obese,   Peds negative pediatric ROS (+)  Hematology negative hematology ROS (+)   Anesthesia Other Findings   Reproductive/Obstetrics negative OB ROS                            Anesthesia Physical Anesthesia Plan  ASA: II  Anesthesia Plan: MAC   Post-op Pain Management:    Induction: Intravenous  Airway Management Planned:   Additional Equipment:   Intra-op Plan:   Post-operative Plan:   Informed Consent: I have reviewed the patients History and Physical, chart, labs and discussed the procedure including the risks, benefits and alternatives for the proposed anesthesia with the patient or authorized representative who has indicated his/her understanding and acceptance.   Dental advisory given  Plan Discussed with: CRNA  Anesthesia Plan Comments:       Anesthesia Quick Evaluation

## 2015-11-21 NOTE — Discharge Instructions (Addendum)
Transrectal Ultrasound-Guided Biopsy A transrectal ultrasound-guided biopsy is a procedure to remove samples of tissue from your prostate using ultrasound images to guide the procedure. The procedure is usually done to evaluate the prostate gland of men who have an elevated prostate-specific antigen (PSA). PSA is a blood test to screen for prostate cancer. The biopsy samples are taken to check for prostate cancer.  LET [facility] CARE PROVIDER KNOW ABOUT:  Any allergies you have.  All medicines you are taking, including vitamins, herbs, eye drops, creams, and over-the-counter medicines.  Previous problems you or members of your family have had with the use of anesthetics.  Any blood disorders you have.  Previous surgeries you have had.  Medical conditions you have. RISKS AND COMPLICATIONS Generally, this is a safe procedure. However, as with any procedure, problems can occur. Possible problems include:  Infection of your prostate.  Bleeding from your rectum or blood in your urine.  Difficulty urinating.  Nerve damage (this is usually temporary).  Damage to surrounding structures such as blood vessels, organs, and muscles, which would require other procedures. BEFORE THE PROCEDURE  Do not eat or drink anything after midnight on the night before the procedure or as directed by your health care provider.  Take medicines only as directed by your health care provider.  Your health care provider may have you stop taking certain medicines 5-7 days before the procedure.  You will be given an enema before the procedure. During an enema, a liquid is injected into your rectum to clear out waste.  You may have lab tests the day of your procedure.   Plan to have someone take you home after the procedure. PROCEDURE   You will be given medicine to help you relax (sedative) before the procedure. An IV tube will be inserted into one of your veins and used to give fluids and  medicine.  You will be given antibiotic medicine to reduce the risk of an infection.  You will be placed on your side for the procedure.  A probe with lubricated gel will be placed into your rectum, and images will be taken of your prostate and surrounding structures.  Numbing medicine will be injected into the prostate before the biopsy samples are taken.  A biopsy needle will then be inserted and guided to your prostate with the use of the ultrasound images.  Samples of prostate tissue will be taken, and the needle will then be removed.  The biopsy samples will be sent to a lab to be analyzed. Results are usually back in 2-3 days. AFTER THE PROCEDURE  You will be taken to a recovery area where you will be monitored.  You may have some discomfort in the rectal area. You will be given pain medicines to control this.  You may be allowed to go home the same day, or you may need to stay in the hospital overnight.   This information is not intended to replace advice given to you by your health care provider. Make sure you discuss any questions you have with your health care provider.   Document Released: 01/31/2014 Document Revised: 10/07/2014 Document Reviewed: 01/31/2014 Elsevier Interactive Patient Education 2016 [place].    Please call if you fever > 101, heavy bleeding or difficulty urinating.

## 2015-11-22 ENCOUNTER — Encounter (HOSPITAL_BASED_OUTPATIENT_CLINIC_OR_DEPARTMENT_OTHER): Payer: Self-pay | Admitting: Urology

## 2016-02-24 ENCOUNTER — Other Ambulatory Visit: Payer: Self-pay | Admitting: Family Medicine

## 2016-03-19 ENCOUNTER — Encounter: Payer: Self-pay | Admitting: Family Medicine

## 2016-03-19 ENCOUNTER — Ambulatory Visit (INDEPENDENT_AMBULATORY_CARE_PROVIDER_SITE_OTHER): Payer: BLUE CROSS/BLUE SHIELD | Admitting: Family Medicine

## 2016-03-19 ENCOUNTER — Ambulatory Visit (INDEPENDENT_AMBULATORY_CARE_PROVIDER_SITE_OTHER): Payer: BLUE CROSS/BLUE SHIELD

## 2016-03-19 VITALS — BP 126/72 | HR 62 | Temp 97.6°F | Ht 72.0 in | Wt 232.8 lb

## 2016-03-19 DIAGNOSIS — H8110 Benign paroxysmal vertigo, unspecified ear: Secondary | ICD-10-CM

## 2016-03-19 DIAGNOSIS — E559 Vitamin D deficiency, unspecified: Secondary | ICD-10-CM

## 2016-03-19 DIAGNOSIS — I10 Essential (primary) hypertension: Secondary | ICD-10-CM

## 2016-03-19 DIAGNOSIS — R0789 Other chest pain: Secondary | ICD-10-CM

## 2016-03-19 DIAGNOSIS — Z6831 Body mass index (BMI) 31.0-31.9, adult: Secondary | ICD-10-CM | POA: Diagnosis not present

## 2016-03-19 DIAGNOSIS — R972 Elevated prostate specific antigen [PSA]: Secondary | ICD-10-CM | POA: Diagnosis not present

## 2016-03-19 DIAGNOSIS — E785 Hyperlipidemia, unspecified: Secondary | ICD-10-CM | POA: Diagnosis not present

## 2016-03-19 DIAGNOSIS — R739 Hyperglycemia, unspecified: Secondary | ICD-10-CM

## 2016-03-19 DIAGNOSIS — R7309 Other abnormal glucose: Secondary | ICD-10-CM

## 2016-03-19 DIAGNOSIS — R7303 Prediabetes: Secondary | ICD-10-CM

## 2016-03-19 LAB — BAYER DCA HB A1C WAIVED: HB A1C (BAYER DCA - WAIVED): 6.5 % (ref <7.0)

## 2016-03-19 MED ORDER — METOPROLOL SUCCINATE ER 100 MG PO TB24: 100.0000 mg | ORAL_TABLET | Freq: Every morning | ORAL | Status: DC

## 2016-03-19 MED ORDER — AMLODIPINE BESYLATE 5 MG PO TABS: 7.5000 mg | ORAL_TABLET | Freq: Every morning | ORAL | Status: DC

## 2016-03-19 MED ORDER — LISINOPRIL 20 MG PO TABS: 20.0000 mg | ORAL_TABLET | Freq: Every day | ORAL | Status: DC

## 2016-03-19 MED ORDER — ROSUVASTATIN CALCIUM 40 MG PO TABS: 40.0000 mg | ORAL_TABLET | Freq: Every day | ORAL | Status: DC

## 2016-03-19 MED ORDER — ESOMEPRAZOLE MAGNESIUM 20 MG PO CPDR: 20.0000 mg | DELAYED_RELEASE_CAPSULE | ORAL | Status: DC

## 2016-03-19 MED ORDER — FLUTICASONE PROPIONATE 50 MCG/ACT NA SUSP: 1.0000 | NASAL | Status: DC | PRN

## 2016-03-19 NOTE — Patient Instructions (Addendum)
Continue current medications. Continue good therapeutic lifestyle changes which include good diet and exercise. Fall precautions discussed with patient. If an FOBT was given today- please return it to our front desk. If you are over 66 years old - you may need Prevnar 5 or the adult Pneumonia vaccine.  Flu Shots will be available at our office starting mid- September. Please call and schedule a FLU CLINIC APPOINTMENT.                        Medicare Annual Wellness Visit  [facility] and the medical providers at [facility] strive to bring you the best medical care.  In doing so we not only want to address your current medical conditions and concerns but also to detect new conditions early and prevent illness, disease and health-related problems.    Medicare offers a yearly Wellness Visit which allows our clinical staff to assess your need for preventative services including immunizations, lifestyle education, counseling to decrease risk of preventable diseases and screening for fall risk and other medical concerns.    This visit is provided free of charge (no copay) for all Medicare recipients. The clinical pharmacists at [facility] have begun to conduct these Wellness Visits which will also include a thorough review of all your medications.    As you primary medical provider recommend that you make an appointment for your Annual Wellness Visit if you have not done so already this year.  You may set up this appointment before you leave today or you may call back [phone] and schedule an appointment.  Please make sure when you call that you mention that you are scheduling your Annual Wellness Visit with the clinical pharmacist so that the appointment may be made for the proper length of time.    The patient will need to follow-up with the urologist in August.  Please continue to work on and make every effort to lose weight. Work on carbohydrate  intake and drink more water and no drinks.  Occasionally monitor blood sugars at home.  Increase Flonase for the short time. Intake 2 sprays each nostril at bedtime.

## 2016-03-19 NOTE — Progress Notes (Signed)
Subjective:    Patient ID: [name], male    DOB: 10/16/1950, 65 y.o.   MRN: [id number]  HPI Patient is here today for a 6 month follow up on hypertension and hyperlipidemia. Patient has no other complaints today. The patient is doing well today overall. He continues to be followed by the urologist for his elevated PSA and has another appointment with the urologist in August and he will need a PSA here prior to that visit. He has some chest wall soreness from doing heavy lifting. He otherwise denies any chest pain shortness of breath trouble swallowing heartburn indigestion nausea vomiting diarrhea blood in the stool or black tarry bowel movements. He does have an elevated PSA but he also has an enlarged prostate and this is effective his voiding and makes his voiding slower. He just got his eyes checked yesterday. Everything was good with that.   Review of Systems  Constitutional: Negative.   HENT: Negative.   Eyes: Negative.   Respiratory: Negative.   Cardiovascular: Negative.   Gastrointestinal: Negative.   Endocrine: Negative.   Genitourinary: Negative.   Musculoskeletal: Negative.   Skin: Negative.   Allergic/Immunologic: Negative.   Neurological: Negative.   Hematological: Negative.   Psychiatric/Behavioral: Negative.        Patient Active Problem List   Diagnosis Date Noted  . Pre-diabetes 03/18/2014  . SBO (small bowel obstruction) ([place]) 03/22/2012  . Hypertension   . Hyperlipidemia   . Reflux   . Vertigo, intermittent   . Other testicular hypofunction    Outpatient Encounter Prescriptions as of 03/19/2016  Medication Sig  . amLODipine (NORVASC) 5 MG tablet Take 1.5 tablets (7.5 mg total) by mouth daily. (Patient taking differently: Take 7.5 mg by mouth every morning. )  . aspirin 81 MG tablet Take 81 mg by mouth daily.  . Cholecalciferol (VITAMIN D) 2000 UNITS CAPS Take 1-2 capsules by mouth daily. Takes 1 capsule daily= 2000 units; Except on Saturday and Sunday  patient takes 2 capsules= 4000 units  . CRESTOR 40 MG tablet TAKE 1 TABLET DAILY AS DIRECTED  . esomeprazole (NEXIUM) 20 MG capsule Take 20 mg by mouth every other day.   . fluticasone (FLONASE) 50 MCG/ACT nasal spray Place 1 spray into both nostrils as needed for allergies or rhinitis.  Marland Kitchen lisinopril (PRINIVIL,ZESTRIL) 20 MG tablet TAKE 1 TABLET DAILY  . metoprolol succinate (TOPROL-XL) 100 MG 24 hr tablet Take 1 tablet (100 mg total) by mouth daily. Take with or immediately following a meal. (Patient taking differently: Take 100 mg by mouth every morning. Take with or immediately following a meal.)  . [DISCONTINUED] meclizine (ANTIVERT) 25 MG tablet Take 25 mg by mouth 3 (three) times daily as needed. Reported on 03/19/2016  . [DISCONTINUED] sodium chloride (OCEAN) 0.65 % SOLN nasal spray Place 1 spray into both nostrils as needed for congestion. (Patient not taking: Reported on 03/19/2016)   No facility-administered encounter medications on file as of 03/19/2016.       Objective:   Physical Exam  Constitutional: He is oriented to person, place, and time. He appears well-developed and well-nourished. No distress.  HENT:  Head: Normocephalic and atraumatic.  Right Ear: External ear normal.  Left Ear: External ear normal.  Nose: Nose normal.  Mouth/Throat: Oropharynx is clear and moist. No oropharyngeal exudate.  Eyes: Conjunctivae and EOM are normal. Pupils are equal, round, and reactive to light. Right eye exhibits no discharge. Left eye exhibits no discharge. No scleral icterus.  Recent eye  exam  Neck: Normal range of motion. Neck supple. No thyromegaly present.  The neck is without bruits thyromegaly or anterior cervical adenopathy  Cardiovascular: Normal rate, regular rhythm, normal heart sounds and intact distal pulses.   No murmur heard. The heart is regular at 60/m  Pulmonary/Chest: Effort normal and breath sounds normal. No respiratory distress. He has no wheezes. He has no rales.  He exhibits no tenderness.  Clear anteriorly and posteriorly no axillary adenopathy  Abdominal: Soft. Bowel sounds are normal. He exhibits no mass. There is no tenderness. There is no rebound and no guarding.  No epigastric tenderness. No liver or spleen enlargement. No inguinal adenopathy.  Genitourinary:  The patient has a follow-up visit with the urologist in mid August.  Musculoskeletal: Normal range of motion. He exhibits no edema or tenderness.  Lymphadenopathy:    He has no cervical adenopathy.  Neurological: He is alert and oriented to person, place, and time. He has normal reflexes. No cranial nerve deficit.  Skin: Skin is warm and dry. No rash noted.  Psychiatric: He has a normal mood and affect. His behavior is normal. Judgment and thought content normal.  Nursing note and vitals reviewed.  Temp(Src) 97.6 F (36.4 C) (Oral)  Ht 6' (1.829 m)  Wt 232 lb 12.8 oz (105.597 kg)  BMI 31.57 kg/m2  [facility] reading (PRIMARY) by  Dr. Brunilda Payor x-ray with results pending                                        Assessment & Plan:  1. Elevated PSA -Follow-up with urology as planned in August - PSA, total and free; Future  2. Hyperlipidemia -Continue current treatment and aggressive therapeutic lifestyle changes pending results of lab work - Hepatic function panel - NMR, lipoprofile  3. Hypertension -The blood pressure is good today and he will continue with current treatment - BMP8+EGFR - CBC with Differential/Platelet  4. Vitamin D deficiency -Continue current treatment pending results of lab work - VITAMIN D 25 Hydroxy (Vit-D Deficiency, Fractures)  5. Pre-diabetes -Continue to work aggressively on weight management weight loss and diet avoiding carbs sugars and drinking more water  6. Benign paroxysmal positional vertigo, unspecified laterality -This is stable today  7. Chest wall pain -This is secondary to lifting sandbags. - DG Chest 2 View; Future  8. BMI  31.0-31.9,adult -The patient will work aggressively on weight loss with diet and exercise  9. Blood sugar increased -Continue to work on weight loss with diet and exercise  Meds ordered this encounter  Medications  . metoprolol succinate (TOPROL-XL) 100 MG 24 hr tablet    Sig: Take 1 tablet (100 mg total) by mouth every morning. Take with or immediately following a meal.    Dispense:  30 tablet    Refill:  6  . lisinopril (PRINIVIL,ZESTRIL) 20 MG tablet    Sig: Take 1 tablet (20 mg total) by mouth daily.    Dispense:  90 tablet    Refill:  1  . fluticasone (FLONASE) 50 MCG/ACT nasal spray    Sig: Place 1 spray into both nostrils as needed for allergies or rhinitis.    Dispense:  16 g    Refill:  6  . esomeprazole (NEXIUM) 20 MG capsule    Sig: Take 1 capsule (20 mg total) by mouth every other day.    Dispense:  30 capsule  Refill:  6  . rosuvastatin (CRESTOR) 40 MG tablet    Sig: Take 1 tablet (40 mg total) by mouth daily. as directed    Dispense:  30 tablet    Refill:  6  . amLODipine (NORVASC) 5 MG tablet    Sig: Take 1.5 tablets (7.5 mg total) by mouth every morning.    Dispense:  135 tablet    Refill:  3   Patient Instructions  Continue current medications. Continue good therapeutic lifestyle changes which include good diet and exercise. Fall precautions discussed with patient. If an FOBT was given today- please return it to our front desk. If you are over 64 years old - you may need Prevnar 52 or the adult Pneumonia vaccine.  Flu Shots will be available at our office starting mid- September. Please call and schedule a FLU CLINIC APPOINTMENT.                        Medicare Annual Wellness Visit  [facility] and the medical providers at [facility] strive to bring you the best medical care.  In doing so we not only want to address your current medical conditions and concerns but also to detect new conditions early and prevent illness, disease  and health-related problems.    Medicare offers a yearly Wellness Visit which allows our clinical staff to assess your need for preventative services including immunizations, lifestyle education, counseling to decrease risk of preventable diseases and screening for fall risk and other medical concerns.    This visit is provided free of charge (no copay) for all Medicare recipients. The clinical pharmacists at [facility] have begun to conduct these Wellness Visits which will also include a thorough review of all your medications.    As you primary medical provider recommend that you make an appointment for your Annual Wellness Visit if you have not done so already this year.  You may set up this appointment before you leave today or you may call back [phone]) and schedule an appointment.  Please make sure when you call that you mention that you are scheduling your Annual Wellness Visit with the clinical pharmacist so that the appointment may be made for the proper length of time.    The patient will need to follow-up with the urologist in August.  Please continue to work on and make every effort to lose weight. Work on carbohydrate intake and drink more water and no drinks.  Occasionally monitor blood sugars at home.  Increase Flonase for the short time. Intake 2 sprays each nostril at bedtime.   Arrie Senate MD

## 2016-03-20 LAB — HEPATIC FUNCTION PANEL
ALK PHOS: 134 IU/L — AB (ref 39–117)
ALT: 18 IU/L (ref 0–44)
AST: 15 IU/L (ref 0–40)
Albumin: 4.3 g/dL (ref 3.6–4.8)
BILIRUBIN TOTAL: 0.6 mg/dL (ref 0.0–1.2)
BILIRUBIN, DIRECT: 0.18 mg/dL (ref 0.00–0.40)
Total Protein: 7 g/dL (ref 6.0–8.5)

## 2016-03-20 LAB — CBC WITH DIFFERENTIAL/PLATELET
Basophils Absolute: 0 10*3/uL (ref 0.0–0.2)
Basos: 1 %
EOS (ABSOLUTE): 0.1 10*3/uL (ref 0.0–0.4)
EOS: 2 %
HEMATOCRIT: 44.9 % (ref 37.5–51.0)
Hemoglobin: 15.3 g/dL (ref 12.6–17.7)
Immature Grans (Abs): 0 10*3/uL (ref 0.0–0.1)
Immature Granulocytes: 0 %
LYMPHS ABS: 1.3 10*3/uL (ref 0.7–3.1)
Lymphs: 21 %
MCH: 30.1 pg (ref 26.6–33.0)
MCHC: 34.1 g/dL (ref 31.5–35.7)
MCV: 88 fL (ref 79–97)
MONOS ABS: 0.4 10*3/uL (ref 0.1–0.9)
Monocytes: 8 %
Neutrophils Absolute: 4.1 10*3/uL (ref 1.4–7.0)
Neutrophils: 68 %
PLATELETS: 185 10*3/uL (ref 150–379)
RBC: 5.08 x10E6/uL (ref 4.14–5.80)
RDW: 13.9 % (ref 12.3–15.4)
WBC: 5.9 10*3/uL (ref 3.4–10.8)

## 2016-03-20 LAB — BMP8+EGFR
BUN / CREAT RATIO: 30 — AB (ref 10–24)
BUN: 27 mg/dL (ref 8–27)
CALCIUM: 9.3 mg/dL (ref 8.6–10.2)
CHLORIDE: 100 mmol/L (ref 96–106)
CO2: 24 mmol/L (ref 18–29)
CREATININE: 0.9 mg/dL (ref 0.76–1.27)
GFR, EST AFRICAN AMERICAN: 103 mL/min/{1.73_m2} (ref >59)
GFR, EST NON AFRICAN AMERICAN: 89 mL/min/{1.73_m2} (ref >59)
Glucose: 126 mg/dL — ABNORMAL HIGH (ref 65–99)
Potassium: 3.9 mmol/L (ref 3.5–5.2)
Sodium: 140 mmol/L (ref 134–144)

## 2016-03-20 LAB — NMR, LIPOPROFILE
Cholesterol: 106 mg/dL (ref 100–199)
HDL Cholesterol by NMR: 42 mg/dL (ref >39)
HDL Particle Number: 29 umol/L — ABNORMAL LOW (ref >=30.5)
LDL PARTICLE NUMBER: 563 nmol/L (ref <1000)
LDL SIZE: 20 nm (ref >20.5)
LDL-C: 45 mg/dL (ref 0–99)
LP-IR SCORE: 39 (ref <=45)
SMALL LDL PARTICLE NUMBER: 370 nmol/L (ref <=527)
TRIGLYCERIDES BY NMR: 94 mg/dL (ref 0–149)

## 2016-03-20 LAB — VITAMIN D 25 HYDROXY (VIT D DEFICIENCY, FRACTURES): Vit D, 25-Hydroxy: 40.9 ng/mL (ref 30.0–100.0)

## 2016-04-23 ENCOUNTER — Encounter: Payer: Self-pay | Admitting: Family Medicine

## 2016-04-23 ENCOUNTER — Ambulatory Visit (INDEPENDENT_AMBULATORY_CARE_PROVIDER_SITE_OTHER): Payer: BLUE CROSS/BLUE SHIELD | Admitting: Family Medicine

## 2016-04-23 VITALS — BP 144/86 | HR 65 | Temp 98.1°F | Ht 72.0 in | Wt 234.2 lb

## 2016-04-23 DIAGNOSIS — J45909 Unspecified asthma, uncomplicated: Secondary | ICD-10-CM | POA: Diagnosis not present

## 2016-04-23 MED ORDER — AZITHROMYCIN 250 MG PO TABS: ORAL_TABLET | ORAL | 0 refills | Status: DC

## 2016-04-23 NOTE — Progress Notes (Signed)
   Subjective:    Patient ID: [name], male    DOB: 20-Jan-1951, 65 y.o.   MRN: [id number]  HPI 65 year old gentleman with cold and cough for about a week. When symptoms first started he had some facial pain and pressure and was coughing up brownish sputum. Since then sputum has cleared but he continues to have cough and wheeze. He has not had fever or chills.  Patient Active Problem List   Diagnosis Date Noted  . Elevated PSA 03/19/2016  . Pre-diabetes 03/18/2014  . SBO (small bowel obstruction) ([place]) 03/22/2012  . Hypertension   . Hyperlipidemia   . Reflux   . Vertigo, intermittent   . Other testicular hypofunction    Outpatient Encounter Prescriptions as of 04/23/2016  Medication Sig  . amLODipine (NORVASC) 5 MG tablet Take 1.5 tablets (7.5 mg total) by mouth every morning.  Marland Kitchen aspirin 81 MG tablet Take 81 mg by mouth daily.  . Cholecalciferol (VITAMIN D) 2000 UNITS CAPS Take 1-2 capsules by mouth daily. Takes 1 capsule daily= 2000 units; Except on Saturday and Sunday patient takes 2 capsules= 4000 units  . esomeprazole (NEXIUM) 20 MG capsule Take 1 capsule (20 mg total) by mouth every other day.  . fluticasone (FLONASE) 50 MCG/ACT nasal spray Place 1 spray into both nostrils as needed for allergies or rhinitis.  Marland Kitchen lisinopril (PRINIVIL,ZESTRIL) 20 MG tablet Take 1 tablet (20 mg total) by mouth daily.  . metoprolol succinate (TOPROL-XL) 100 MG 24 hr tablet Take 1 tablet (100 mg total) by mouth every morning. Take with or immediately following a meal.  . rosuvastatin (CRESTOR) 40 MG tablet Take 1 tablet (40 mg total) by mouth daily. as directed   No facility-administered encounter medications on file as of 04/23/2016.       Review of Systems  Constitutional: Negative.   Respiratory: Positive for cough and wheezing.   Cardiovascular: Negative.   Neurological: Negative.   Psychiatric/Behavioral: Negative.        Objective:   Physical Exam  Constitutional: He is oriented to  person, place, and time. He appears well-developed and well-nourished.  HENT:  Mouth/Throat: Oropharynx is clear and moist.  Cardiovascular: Normal rate, regular rhythm and normal heart sounds.   Pulmonary/Chest: Effort normal. He has wheezes.  Neurological: He is alert and oriented to person, place, and time.   BP (!) 144/86 (BP Location: Left Arm, Patient Position: Sitting, Cuff Size: Normal)   Pulse 65   Temp 98.1 F (36.7 C) (Oral)   Ht 6' (1.829 m)   Wt 234 lb 3.2 oz (106.2 kg)   BMI 31.76 kg/m         Assessment & Plan:  1. Mild reactive airways disease I believe this patient had bronchitis that is now resolving and has residual inflammation that triggers the cough and wheeze. I gave him a sample of Breo, with instructions on use and rinsing afterwards  Wardell Honour MD

## 2016-04-29 DIAGNOSIS — Z85820 Personal history of malignant melanoma of skin: Secondary | ICD-10-CM | POA: Diagnosis not present

## 2016-04-29 DIAGNOSIS — L570 Actinic keratosis: Secondary | ICD-10-CM | POA: Diagnosis not present

## 2016-04-29 DIAGNOSIS — D225 Melanocytic nevi of trunk: Secondary | ICD-10-CM | POA: Diagnosis not present

## 2016-04-29 DIAGNOSIS — D2272 Melanocytic nevi of left lower limb, including hip: Secondary | ICD-10-CM | POA: Diagnosis not present

## 2016-04-29 DIAGNOSIS — X32XXXD Exposure to sunlight, subsequent encounter: Secondary | ICD-10-CM | POA: Diagnosis not present

## 2016-04-29 DIAGNOSIS — L905 Scar conditions and fibrosis of skin: Secondary | ICD-10-CM | POA: Diagnosis not present

## 2016-04-29 DIAGNOSIS — Z08 Encounter for follow-up examination after completed treatment for malignant neoplasm: Secondary | ICD-10-CM | POA: Diagnosis not present

## 2016-04-29 DIAGNOSIS — Z1283 Encounter for screening for malignant neoplasm of skin: Secondary | ICD-10-CM | POA: Diagnosis not present

## 2016-05-15 DIAGNOSIS — R972 Elevated prostate specific antigen [PSA]: Secondary | ICD-10-CM | POA: Diagnosis not present

## 2016-05-22 DIAGNOSIS — R972 Elevated prostate specific antigen [PSA]: Secondary | ICD-10-CM | POA: Diagnosis not present

## 2016-05-22 DIAGNOSIS — N4232 Atypical small acinar proliferation of prostate: Secondary | ICD-10-CM | POA: Diagnosis not present

## 2016-09-17 ENCOUNTER — Encounter: Payer: Self-pay | Admitting: Family Medicine

## 2016-09-17 ENCOUNTER — Ambulatory Visit (INDEPENDENT_AMBULATORY_CARE_PROVIDER_SITE_OTHER): Payer: BLUE CROSS/BLUE SHIELD | Admitting: Family Medicine

## 2016-09-17 VITALS — BP 134/72 | HR 63 | Temp 97.4°F | Ht 72.0 in | Wt 233.0 lb

## 2016-09-17 DIAGNOSIS — R739 Hyperglycemia, unspecified: Secondary | ICD-10-CM | POA: Diagnosis not present

## 2016-09-17 DIAGNOSIS — I10 Essential (primary) hypertension: Secondary | ICD-10-CM | POA: Diagnosis not present

## 2016-09-17 DIAGNOSIS — Z0000 Encounter for general adult medical examination without abnormal findings: Secondary | ICD-10-CM

## 2016-09-17 DIAGNOSIS — Z23 Encounter for immunization: Secondary | ICD-10-CM

## 2016-09-17 DIAGNOSIS — E7800 Pure hypercholesterolemia, unspecified: Secondary | ICD-10-CM | POA: Diagnosis not present

## 2016-09-17 DIAGNOSIS — E559 Vitamin D deficiency, unspecified: Secondary | ICD-10-CM

## 2016-09-17 DIAGNOSIS — R972 Elevated prostate specific antigen [PSA]: Secondary | ICD-10-CM

## 2016-09-17 DIAGNOSIS — N5201 Erectile dysfunction due to arterial insufficiency: Secondary | ICD-10-CM

## 2016-09-17 LAB — URINALYSIS, COMPLETE
Bilirubin, UA: NEGATIVE
Glucose, UA: NEGATIVE
Nitrite, UA: NEGATIVE
PH UA: 5 (ref 5.0–7.5)
PROTEIN UA: NEGATIVE
RBC, UA: NEGATIVE
Specific Gravity, UA: 1.02 (ref 1.005–1.030)
Urobilinogen, Ur: 0.2 mg/dL (ref 0.2–1.0)

## 2016-09-17 LAB — MICROSCOPIC EXAMINATION
BACTERIA UA: NONE SEEN
Epithelial Cells (non renal): NONE SEEN /hpf (ref 0–10)
RBC, UA: NONE SEEN /hpf (ref 0–?)
Renal Epithel, UA: NONE SEEN /hpf

## 2016-09-17 NOTE — Progress Notes (Signed)
Subjective:    Patient ID: [name], male    DOB: [birth year], 65 y.o.   MRN: [id number]  HPI Patient is here today for annual wellness exam and follow up of chronic medical problems which includes hyperlipidemia and hypertension. He is taking medications regularly.The patient is doing well overall. He is having no complaints and he is not requesting any refills. He is examined by the urologist and he does his PSAs and rectal exams. He will be given an FOBT to return and will get lab work and will be given a flu shot today. We will also encourage him to get a stress test as the last one had was over 5 years ago. His vital signs today are stable and his weight is down 1 pound he has a BMI 31.8. The patient denies chest pain shortness of breath palpitations or pressure. He does have a history of a hiatal hernia repair and continues to take Nexium 20 mg every other day. We discussed the long-term side effects of Nexium and he may consider trying some ranitidine 150. He denies any blood in the stool or black tarry bowel movements. His next colonoscopy may be due this coming spring and he will wait and see if he hears from the gastroenterologist about that. He's passing his water without problems and has had problems with an elevated PSA which when it was checked the last time was lower. He has an appointment for follow-up with the urologist in February..    Patient Active Problem List   Diagnosis Date Noted  . Elevated PSA 03/19/2016  . Pre-diabetes 03/18/2014  . SBO (small bowel obstruction) 03/22/2012  . Hypertension   . Hyperlipidemia   . Reflux   . Vertigo, intermittent   . Other testicular hypofunction    Outpatient Encounter Prescriptions as of 09/17/2016  Medication Sig  . amLODipine (NORVASC) 5 MG tablet Take 1.5 tablets (7.5 mg total) by mouth every morning.  Marland Kitchen aspirin 81 MG tablet Take 81 mg by mouth daily.  . Cholecalciferol (VITAMIN D) 2000 UNITS CAPS Take 1-2 capsules by mouth  daily. Takes 1 capsule daily= 2000 units; Except on Saturday and Sunday patient takes 2 capsules= 4000 units  . esomeprazole (NEXIUM) 20 MG capsule Take 1 capsule (20 mg total) by mouth every other day.  . lisinopril (PRINIVIL,ZESTRIL) 20 MG tablet Take 1 tablet (20 mg total) by mouth daily.  . metoprolol succinate (TOPROL-XL) 100 MG 24 hr tablet Take 1 tablet (100 mg total) by mouth every morning. Take with or immediately following a meal.  . rosuvastatin (CRESTOR) 40 MG tablet Take 1 tablet (40 mg total) by mouth daily. as directed  . fluticasone (FLONASE) 50 MCG/ACT nasal spray Place 1 spray into both nostrils as needed for allergies or rhinitis. (Patient not taking: Reported on 09/17/2016)  . [DISCONTINUED] azithromycin (ZITHROMAX Z-PAK) 250 MG tablet Take as directed   No facility-administered encounter medications on file as of 09/17/2016.       Review of Systems  Constitutional: Negative.   HENT: Negative.   Eyes: Negative.   Respiratory: Negative.   Cardiovascular: Negative.   Gastrointestinal: Negative.   Endocrine: Negative.   Genitourinary: Negative.   Musculoskeletal: Negative.   Skin: Negative.   Allergic/Immunologic: Negative.   Neurological: Negative.   Hematological: Negative.   Psychiatric/Behavioral: Negative.        Objective:   Physical Exam  Constitutional: He is oriented to person, place, and time. He appears well-developed and well-nourished.  The patient  is pleasant and alert  HENT:  Head: Normocephalic and atraumatic.  Right Ear: External ear normal.  Left Ear: External ear normal.  Mouth/Throat: Oropharynx is clear and moist. No oropharyngeal exudate.  Minimal nasal congestion  Eyes: Conjunctivae and EOM are normal. Pupils are equal, round, and reactive to light. Right eye exhibits no discharge. Left eye exhibits no discharge. No scleral icterus.  Neck: Normal range of motion. Neck supple. No thyromegaly present.  No bruits thyromegaly or anterior  cervical adenopathy  Cardiovascular: Normal rate, regular rhythm, normal heart sounds and intact distal pulses.   No murmur heard. The heart has a regular rate and rhythm at 60/m  Pulmonary/Chest: Effort normal and breath sounds normal. No respiratory distress. He has no wheezes. He has no rales. He exhibits no tenderness.  No axillary adenopathy and the chest was clear anteriorly and posteriorly with no chest wall masses  Abdominal: Soft. Bowel sounds are normal. He exhibits no mass. There is no tenderness. There is no rebound and no guarding.  No organ enlargement masses or inguinal adenopathy. No bruits.  Genitourinary:  Genitourinary Comments: The patient is seen by the urologist every 6 months because of an elevated PSA. His next visit is coming up in February 2018.  Musculoskeletal: Normal range of motion. He exhibits no edema.  Lymphadenopathy:    He has no cervical adenopathy.  Neurological: He is alert and oriented to person, place, and time. He has normal reflexes. No cranial nerve deficit.  Skin: Skin is warm and dry. No rash noted.  Psychiatric: He has a normal mood and affect. His behavior is normal. Judgment and thought content normal.  Nursing note and vitals reviewed.   BP 134/72 (BP Location: Left Arm)   Pulse 63   Temp 97.4 F (36.3 C) (Oral)   Ht 6' (1.829 m)   Wt 233 lb (105.7 kg)   BMI 31.60 kg/m        Assessment & Plan:  1. Annual physical exam -Patient will receive his flu vaccine today and we will make sure that he is up-to-date on his Pneumovax. - Urinalysis, Complete - BMP8+EGFR - CBC with Differential/Platelet - Hepatic function panel - VITAMIN D 25 Hydroxy (Vit-D Deficiency, Fractures) - NMR, lipoprofile  2. Hypertension -The blood pressure is good and he will continue with current treatment - BMP8+EGFR - CBC with Differential/Platelet - Hepatic function panel  3. Pure hypercholesterolemia -Continue aggressive therapeutic lifestyle changes  and current treatment for cholesterol - CBC with Differential/Platelet - NMR, lipoprofile  4. Vitamin D deficiency -Continue vitamin D replacement pending results of lab work - CBC with Differential/Platelet - VITAMIN D 25 Hydroxy (Vit-D Deficiency, Fractures)  5. Elevated PSA -Follow-up with urology as planned - Urinalysis, Complete - CBC with Differential/Platelet  6. Erectile dysfunction due to arterial insufficiency -Trial of Levitra 20 mg one half to 1 tablet daily  Patient Instructions                       Medicare Annual Wellness Visit  [facility] and the medical providers at [facility] strive to bring you the best medical care.  In doing so we not only want to address your current medical conditions and concerns but also to detect new conditions early and prevent illness, disease and health-related problems.    Medicare offers a yearly Wellness Visit which allows our clinical staff to assess your need for preventative services including immunizations, lifestyle education, counseling to decrease risk  of preventable diseases and screening for fall risk and other medical concerns.    This visit is provided free of charge (no copay) for all Medicare recipients. The clinical pharmacists at [facility] have begun to conduct these Wellness Visits which will also include a thorough review of all your medications.    As you primary medical provider recommend that you make an appointment for your Annual Wellness Visit if you have not done so already this year.  You may set up this appointment before you leave today or you may call back [phone]) and schedule an appointment.  Please make sure when you call that you mention that you are scheduling your Annual Wellness Visit with the clinical pharmacist so that the appointment may be made for the proper length of time.     Continue current medications. Continue good therapeutic lifestyle  changes which include good diet and exercise. Fall precautions discussed with patient. If an FOBT was given today- please return it to our front desk. If you are over 64 years old - you may need Prevnar 58 or the adult Pneumonia vaccine.  **Flu shots are available--- please call and schedule a FLU-CLINIC appointment**  After your visit with [place] today you will receive a survey in the mail or online from [employer] regarding your care with [place]. Please take a moment to fill this out. Your feedback is very important to [place] as you can help [place] better understand your patient needs as well as improve your experience and satisfaction. WE CARE ABOUT YOU!!!   Stay active physically and continue to work on weight loss through diet and exercise Follow-up with urology as planned We will arrange for you to have a stress test with the cardiologist because of risk factors   Arrie Senate MD

## 2016-09-17 NOTE — Addendum Note (Signed)
Addended by: Zannie Cove on: 09/17/2016 08:55 AM   Modules accepted: Orders

## 2016-09-17 NOTE — Patient Instructions (Addendum)
Medicare Annual Wellness Visit  [facility] and the medical providers at [facility] strive to bring you the best medical care.  In doing so we not only want to address your current medical conditions and concerns but also to detect new conditions early and prevent illness, disease and health-related problems.    Medicare offers a yearly Wellness Visit which allows our clinical staff to assess your need for preventative services including immunizations, lifestyle education, counseling to decrease risk of preventable diseases and screening for fall risk and other medical concerns.    This visit is provided free of charge (no copay) for all Medicare recipients. The clinical pharmacists at [facility] have begun to conduct these Wellness Visits which will also include a thorough review of all your medications.    As you primary medical provider recommend that you make an appointment for your Annual Wellness Visit if you have not done so already this year.  You may set up this appointment before you leave today or you may call back [phone]) and schedule an appointment.  Please make sure when you call that you mention that you are scheduling your Annual Wellness Visit with the clinical pharmacist so that the appointment may be made for the proper length of time.     Continue current medications. Continue good therapeutic lifestyle changes which include good diet and exercise. Fall precautions discussed with patient. If an FOBT was given today- please return it to our front desk. If you are over 66 years old - you may need Prevnar 4 or the adult Pneumonia vaccine.  **Flu shots are available--- please call and schedule a FLU-CLINIC appointment**  After your visit with [place] today you will receive a survey in the mail or online from [employer] regarding your care with [place]. Please take a moment to fill this out. Your feedback is very  important to [place] as you can help [place] better understand your patient needs as well as improve your experience and satisfaction. WE CARE ABOUT YOU!!!   Stay active physically and continue to work on weight loss through diet and exercise Follow-up with urology as planned We will arrange for you to have a stress test with the cardiologist because of risk factors

## 2016-09-18 ENCOUNTER — Other Ambulatory Visit: Payer: BLUE CROSS/BLUE SHIELD

## 2016-09-18 DIAGNOSIS — Z1211 Encounter for screening for malignant neoplasm of colon: Secondary | ICD-10-CM | POA: Diagnosis not present

## 2016-09-18 LAB — CBC WITH DIFFERENTIAL/PLATELET
Basophils Absolute: 0 10*3/uL (ref 0.0–0.2)
Basos: 1 %
EOS (ABSOLUTE): 0.1 10*3/uL (ref 0.0–0.4)
EOS: 1 %
HEMATOCRIT: 45.6 % (ref 37.5–51.0)
HEMOGLOBIN: 15.4 g/dL (ref 13.0–17.7)
IMMATURE GRANS (ABS): 0 10*3/uL (ref 0.0–0.1)
Immature Granulocytes: 0 %
LYMPHS ABS: 1.3 10*3/uL (ref 0.7–3.1)
Lymphs: 25 %
MCH: 30.3 pg (ref 26.6–33.0)
MCHC: 33.8 g/dL (ref 31.5–35.7)
MCV: 90 fL (ref 79–97)
MONOCYTES: 8 %
Monocytes Absolute: 0.4 10*3/uL (ref 0.1–0.9)
NEUTROS ABS: 3.4 10*3/uL (ref 1.4–7.0)
Neutrophils: 65 %
Platelets: 180 10*3/uL (ref 150–379)
RBC: 5.08 x10E6/uL (ref 4.14–5.80)
RDW: 13.9 % (ref 12.3–15.4)
WBC: 5.3 10*3/uL (ref 3.4–10.8)

## 2016-09-18 LAB — BMP8+EGFR
BUN / CREAT RATIO: 24 (ref 10–24)
BUN: 21 mg/dL (ref 8–27)
CO2: 24 mmol/L (ref 18–29)
CREATININE: 0.89 mg/dL (ref 0.76–1.27)
Calcium: 9.5 mg/dL (ref 8.6–10.2)
Chloride: 98 mmol/L (ref 96–106)
GFR calc non Af Amer: 89 mL/min/{1.73_m2} (ref >59)
GFR, EST AFRICAN AMERICAN: 103 mL/min/{1.73_m2} (ref >59)
Glucose: 119 mg/dL — ABNORMAL HIGH (ref 65–99)
Potassium: 4.1 mmol/L (ref 3.5–5.2)
Sodium: 139 mmol/L (ref 134–144)

## 2016-09-18 LAB — NMR, LIPOPROFILE
CHOLESTEROL: 116 mg/dL (ref 100–199)
HDL Cholesterol by NMR: 42 mg/dL (ref >39)
HDL Particle Number: 31.2 umol/L (ref >=30.5)
LDL Particle Number: 807 nmol/L (ref <1000)
LDL SIZE: 20.2 nm (ref >20.5)
LDL-C: 53 mg/dL (ref 0–99)
LP-IR SCORE: 55 — AB (ref <=45)
SMALL LDL PARTICLE NUMBER: 467 nmol/L (ref <=527)
TRIGLYCERIDES BY NMR: 105 mg/dL (ref 0–149)

## 2016-09-18 LAB — HEPATIC FUNCTION PANEL
ALBUMIN: 4.6 g/dL (ref 3.6–4.8)
ALK PHOS: 131 IU/L — AB (ref 39–117)
ALT: 22 IU/L (ref 0–44)
AST: 17 IU/L (ref 0–40)
BILIRUBIN, DIRECT: 0.18 mg/dL (ref 0.00–0.40)
Bilirubin Total: 0.7 mg/dL (ref 0.0–1.2)
Total Protein: 7.3 g/dL (ref 6.0–8.5)

## 2016-09-18 LAB — VITAMIN D 25 HYDROXY (VIT D DEFICIENCY, FRACTURES): Vit D, 25-Hydroxy: 45.4 ng/mL (ref 30.0–100.0)

## 2016-09-19 LAB — FECAL OCCULT BLOOD, IMMUNOCHEMICAL: Fecal Occult Bld: NEGATIVE

## 2016-09-20 LAB — SPECIMEN STATUS REPORT

## 2016-09-20 LAB — HGB A1C W/O EAG: HEMOGLOBIN A1C: 6.5 % — AB (ref 4.8–5.6)

## 2016-11-14 ENCOUNTER — Other Ambulatory Visit: Payer: Self-pay | Admitting: Family Medicine

## 2016-11-18 DIAGNOSIS — R972 Elevated prostate specific antigen [PSA]: Secondary | ICD-10-CM | POA: Diagnosis not present

## 2016-11-25 DIAGNOSIS — N4232 Atypical small acinar proliferation of prostate: Secondary | ICD-10-CM | POA: Diagnosis not present

## 2016-11-25 DIAGNOSIS — N400 Enlarged prostate without lower urinary tract symptoms: Secondary | ICD-10-CM | POA: Diagnosis not present

## 2016-11-25 DIAGNOSIS — R972 Elevated prostate specific antigen [PSA]: Secondary | ICD-10-CM | POA: Diagnosis not present

## 2017-01-01 ENCOUNTER — Ambulatory Visit (INDEPENDENT_AMBULATORY_CARE_PROVIDER_SITE_OTHER): Payer: BLUE CROSS/BLUE SHIELD

## 2017-01-01 ENCOUNTER — Ambulatory Visit (INDEPENDENT_AMBULATORY_CARE_PROVIDER_SITE_OTHER): Payer: BLUE CROSS/BLUE SHIELD | Admitting: Family Medicine

## 2017-01-01 VITALS — BP 149/81 | HR 63 | Temp 97.8°F | Ht 72.0 in | Wt 233.0 lb

## 2017-01-01 DIAGNOSIS — S86001A Unspecified injury of right Achilles tendon, initial encounter: Secondary | ICD-10-CM | POA: Diagnosis not present

## 2017-01-01 DIAGNOSIS — M25571 Pain in right ankle and joints of right foot: Secondary | ICD-10-CM

## 2017-01-01 NOTE — Progress Notes (Signed)
BP (!) 149/81   Pulse 63   Temp 97.8 F (36.6 C) (Oral)   Ht 6' (1.829 m)   Wt 233 lb (105.7 kg)   BMI 31.60 kg/m    Subjective:    Patient ID: [name], male    DOB: 1951-08-14, 66 y.o.   MRN: [id number]  HPI: [name] is a 66 y.o. male presenting on 01/01/2017 for Ankle Pain (Right. Hurt playing basketball)   HPI Right ankle pain Patient was playing basketball in his driveway 3 days ago with some of his grandkids and he tried to take him quick break and he felt a pop in the back of his ankle and since then he has been having very significant pain and difficulty walking with that right ankle at all. He keeps it in a slightly plantar flexed position and it hurts to dorsiflex it and it hurts in the back of his ankle over the Achilles tendon and all the way up the back of his calf when you compress it. He feels like he is somewhat weak and unstable on that ankle. He denies any fevers or chills. There is overlying swelling but no redness or warmth. He has been trying to elevate it and using Aleve which seems to help some.  Relevant past medical, surgical, family and social history reviewed and updated as indicated. Interim medical history since our last visit reviewed. Allergies and medications reviewed and updated.  Review of Systems  Constitutional: Negative for chills and fever.  Respiratory: Negative for shortness of breath and wheezing.   Cardiovascular: Negative for chest pain and leg swelling.  Musculoskeletal: Positive for arthralgias and joint swelling. Negative for back pain and gait problem.  Skin: Negative for color change and rash.  All other systems reviewed and are negative.   Per HPI unless specifically indicated above        Objective:    BP (!) 149/81   Pulse 63   Temp 97.8 F (36.6 C) (Oral)   Ht 6' (1.829 m)   Wt 233 lb (105.7 kg)   BMI 31.60 kg/m   Wt Readings from Last 3 Encounters:  01/01/17 233 lb (105.7 kg)  09/17/16 233 lb (105.7 kg)    04/23/16 234 lb 3.2 oz (106.2 kg)    Physical Exam  Constitutional: He is oriented to person, place, and time. He appears well-developed and well-nourished. No distress.  Eyes: Conjunctivae are normal. No scleral icterus.  Musculoskeletal: Normal range of motion. He exhibits no edema.       Right ankle: Achilles tendon exhibits pain (Pain and swelling over Achilles tendon and pain with dorsiflexion, pain with compression of the back of the gastrocnemius as well.).  Neurological: He is alert and oriented to person, place, and time. Coordination normal.  Skin: Skin is warm and dry. No rash noted. He is not diaphoretic.  Psychiatric: He has a normal mood and affect. His behavior is normal.  Nursing note and vitals reviewed.  Right ankle x-ray: No signs of acute bony abnormality, soft tissue swelling overlying the Achilles tendon.     Assessment & Plan:   Problem List Items Addressed This Visit    None    Visit Diagnoses    Achilles tendon injury, right, initial encounter    -  Primary   Concern for possible partial Achilles tendon tear, will send orthopedic   Relevant Orders   DG Ankle Complete Right (Completed)   Ambulatory referral to Orthopedic Surgery  Follow up plan: Return if symptoms worsen or fail to improve.  Counseling provided for all of the vaccine components Orders Placed This Encounter  Procedures  . DG Ankle Complete Right  . Ambulatory referral to [facility] Andreya Lacks, MD [facility] 01/01/2017, 4:39 PM

## 2017-01-03 DIAGNOSIS — M7661 Achilles tendinitis, right leg: Secondary | ICD-10-CM | POA: Diagnosis not present

## 2017-02-13 ENCOUNTER — Other Ambulatory Visit: Payer: Self-pay | Admitting: Family Medicine

## 2017-03-26 ENCOUNTER — Ambulatory Visit: Payer: BLUE CROSS/BLUE SHIELD | Admitting: Family Medicine

## 2017-03-31 ENCOUNTER — Encounter: Payer: Self-pay | Admitting: Family Medicine

## 2017-03-31 ENCOUNTER — Ambulatory Visit (INDEPENDENT_AMBULATORY_CARE_PROVIDER_SITE_OTHER): Payer: BLUE CROSS/BLUE SHIELD | Admitting: Family Medicine

## 2017-03-31 VITALS — BP 129/69 | HR 58 | Temp 97.2°F | Ht 72.0 in | Wt 234.0 lb

## 2017-03-31 DIAGNOSIS — R972 Elevated prostate specific antigen [PSA]: Secondary | ICD-10-CM

## 2017-03-31 DIAGNOSIS — E7800 Pure hypercholesterolemia, unspecified: Secondary | ICD-10-CM

## 2017-03-31 DIAGNOSIS — J301 Allergic rhinitis due to pollen: Secondary | ICD-10-CM | POA: Diagnosis not present

## 2017-03-31 DIAGNOSIS — I10 Essential (primary) hypertension: Secondary | ICD-10-CM | POA: Diagnosis not present

## 2017-03-31 DIAGNOSIS — E559 Vitamin D deficiency, unspecified: Secondary | ICD-10-CM | POA: Diagnosis not present

## 2017-03-31 DIAGNOSIS — R1012 Left upper quadrant pain: Secondary | ICD-10-CM | POA: Diagnosis not present

## 2017-03-31 NOTE — Patient Instructions (Addendum)
Continue current medications. Continue good therapeutic lifestyle changes which include good diet and exercise. Fall precautions discussed with patient. If an FOBT was given today- please return it to our front desk. If you are over 67 years old - you may need Prevnar 48 or the adult Pneumonia vaccine.  **Flu shots are available--- please call and schedule a FLU-CLINIC appointment**  After your visit with [place] today you will receive a survey in the mail or online from [employer] regarding your care with [place]. Please take a moment to fill this out. Your feedback is very important to [place] as you can help [place] better understand your patient needs as well as improve your experience and satisfaction. WE CARE ABOUT YOU!!!   Get colonoscopy as planned in July. Call gastroenterologist's nurse and let her know about the left-sided abdominal pain that you have been having with increasing frequency Continue to drink plenty of fluids and eat less sugar Follow-up with urologist as planned in February We will call you as we schedule an exercise treadmill test in this office and let you know that date and if you do not hear from [place] in a couple weeks please call us back as far as getting that scheduled. Restart and retry Flonase 2 sprays each nostril daily at bedtime and if necessary take a Claritin once daily for nasal congestion

## 2017-03-31 NOTE — Progress Notes (Signed)
Subjective:    Patient ID: [name], male    DOB: [birth year], 66 y.o.   MRN: [id number]  HPI Patient is here today for follow up of chronic medical problems which includes hypertension. He is taking medication regularly.The patient today does complain of some left-sided abdominal pain. He has a history of an elevated PSA and sees the urologist regularly about this. He will get lab work done today and we'll make sure that his urologist gets a copy of this. The patient denies any chest pain or shortness of breath. He denies any trouble with nausea vomiting diarrhea blood in the stool or black tarry bowel movements. He has had an increasing frequency of left sided abdominal pain that occurs after eating an AUC relieved with belching or burping. Sometimes this happens 2 times at most the time just one time after eating. It may happen every 2-3 days. He also says a history of intestinal blockage and this is completely different he says from that and that usually occurs in the lower abdomen and that is a kind of pain gets worse and worse and requires him to go to the emergency room. He's passing his water without problems and has been followed regularly by the urologist because of an elevated PSA which by the way is improved and he does not have to go back and see the urologist until February. He is due to get an ETT and we will arrange for him to have this. He is up-to-date on his eye exams as he just had an exam recently.    Patient Active Problem List   Diagnosis Date Noted  . Elevated PSA 03/19/2016  . Pre-diabetes 03/18/2014  . SBO (small bowel obstruction) ([place]) 03/22/2012  . Hypertension   . Hyperlipidemia   . Reflux   . Vertigo, intermittent   . Other testicular hypofunction    Outpatient Encounter Prescriptions as of 03/31/2017  Medication Sig  . amLODipine (NORVASC) 5 MG tablet Take 1.5 tablets (7.5 mg total) by mouth every morning.  Marland Kitchen aspirin 81 MG tablet Take 81 mg by mouth daily.  .  Cholecalciferol (VITAMIN D) 2000 UNITS CAPS Take 1-2 capsules by mouth daily. Takes 1 capsule daily= 2000 units; Except on Saturday and Sunday patient takes 2 capsules= 4000 units  . lisinopril (PRINIVIL,ZESTRIL) 20 MG tablet TAKE 1 TABLET DAILY  . metoprolol succinate (TOPROL-XL) 100 MG 24 hr tablet TAKE (1) TABLET DAILY IN THE MORNING.  . rosuvastatin (CRESTOR) 40 MG tablet Take 1 tablet (40 mg total) by mouth daily. as directed  . [DISCONTINUED] esomeprazole (NEXIUM) 20 MG capsule Take 1 capsule (20 mg total) by mouth every other day.  . [DISCONTINUED] fluticasone (FLONASE) 50 MCG/ACT nasal spray Place 1 spray into both nostrils as needed for allergies or rhinitis. (Patient not taking: Reported on 01/01/2017)   No facility-administered encounter medications on file as of 03/31/2017.       Review of Systems  Constitutional: Negative.   HENT: Negative.   Eyes: Negative.   Respiratory: Negative.   Cardiovascular: Negative.   Gastrointestinal: Positive for abdominal pain (left side abd pain after eating).  Endocrine: Negative.   Genitourinary: Negative.   Musculoskeletal: Negative.   Skin: Negative.   Allergic/Immunologic: Negative.   Neurological: Negative.   Hematological: Negative.   Psychiatric/Behavioral: Negative.        Objective:   Physical Exam  Constitutional: He is oriented to person, place, and time. He appears well-developed and well-nourished. No distress.  The patient is  pleasant and alert  HENT:  Head: Normocephalic and atraumatic.  Right Ear: External ear normal.  Left Ear: External ear normal.  Mouth/Throat: Oropharynx is clear and moist. No oropharyngeal exudate.  Nasal congestion and turbinate swelling bilaterally  Eyes: Conjunctivae and EOM are normal. Pupils are equal, round, and reactive to light. Right eye exhibits no discharge. Left eye exhibits no discharge. No scleral icterus.  Patient has recently seen his ophthalmologist and sees them yearly.  Neck:  Normal range of motion. Neck supple. No thyromegaly present.  No bruits thyromegaly or anterior cervical adenopathy  Cardiovascular: Normal rate, regular rhythm, normal heart sounds and intact distal pulses.   No murmur heard. The heart has a regular rate and rhythm at 60/m  Pulmonary/Chest: Effort normal and breath sounds normal. No respiratory distress. He has no wheezes. He has no rales. He exhibits no tenderness.  Clear anteriorly and posteriorly no chest wall masses no axillary adenopathy  Abdominal: Soft. Bowel sounds are normal. He exhibits no mass. There is no tenderness. There is no rebound and no guarding.  No abdominal tenderness masses bruits or organ enlargement  Genitourinary:  Genitourinary Comments: Followed regularly by the urologist in February of each year because of elevated PSA that is better  Musculoskeletal: Normal range of motion. He exhibits no edema.  Lymphadenopathy:    He has no cervical adenopathy.  Neurological: He is alert and oriented to person, place, and time. He has normal reflexes. No cranial nerve deficit.  Skin: Skin is warm and dry. No rash noted.  Psychiatric: He has a normal mood and affect. His behavior is normal. Judgment and thought content normal.  Nursing note and vitals reviewed.   BP 129/69 (BP Location: Left Arm)   Pulse (!) 58   Temp 97.2 F (36.2 C) (Oral)   Ht 6' (1.829 m)   Wt 234 lb (106.1 kg)   BMI 31.74 kg/m        Assessment & Plan:  1. Hypertension -The blood pressure is good today and he will continue with current treatment - BMP8+EGFR - CBC with Differential/Platelet - Hepatic function panel - Exercise Tolerance Test; Future  2. Pure hypercholesterolemia -Continue with aggressive therapeutic lifestyle changes including weight loss with diet and exercise - CBC with Differential/Platelet - Lipid panel - Exercise Tolerance Test; Future  3. Vitamin D deficiency -Continue vitamin D replacement pending results of lab  work - CBC with Differential/Platelet - VITAMIN D 25 Hydroxy (Vit-D Deficiency, Fractures)  4. Elevated PSA -Follow-up with urology as planned - CBC with Differential/Platelet  5. Left upper quadrant pain -The patient will discuss this with his gastroenterologist as he has a colonoscopy coming up soon and may need to have an endoscopy also.  6. Seasonal allergic rhinitis due to pollen -Retry Flonase 2 sprays each nostril at bedtime and take a Claritin once daily  Patient Instructions  Continue current medications. Continue good therapeutic lifestyle changes which include good diet and exercise. Fall precautions discussed with patient. If an FOBT was given today- please return it to our front desk. If you are over 28 years old - you may need Prevnar 3 or the adult Pneumonia vaccine.  **Flu shots are available--- please call and schedule a FLU-CLINIC appointment**  After your visit with [place] today you will receive a survey in the mail or online from [employer] regarding your care with [place]. Please take a moment to fill this out. Your feedback is very important to [place] as you can help [place]  better understand your patient needs as well as improve your experience and satisfaction. WE CARE ABOUT YOU!!!   Get colonoscopy as planned in July. Call gastroenterologist's nurse and let her know about the left-sided abdominal pain that you have been having with increasing frequency Continue to drink plenty of fluids and eat less sugar Follow-up with urologist as planned in February We will call you as we schedule an exercise treadmill test in this office and let you know that date and if you do not hear from [place] in a couple weeks please call us back as far as getting that scheduled. Restart and retry Flonase 2 sprays each nostril daily at bedtime and if necessary take a Claritin once daily for nasal congestion    Arrie Senate MD

## 2017-04-01 ENCOUNTER — Telehealth: Payer: Self-pay | Admitting: *Deleted

## 2017-04-01 LAB — HEPATIC FUNCTION PANEL
ALT: 20 IU/L (ref 0–44)
AST: 18 IU/L (ref 0–40)
Albumin: 4.3 g/dL (ref 3.6–4.8)
Alkaline Phosphatase: 116 IU/L (ref 39–117)
Bilirubin Total: 0.6 mg/dL (ref 0.0–1.2)
Bilirubin, Direct: 0.17 mg/dL (ref 0.00–0.40)
TOTAL PROTEIN: 6.9 g/dL (ref 6.0–8.5)

## 2017-04-01 LAB — CBC WITH DIFFERENTIAL/PLATELET
Basophils Absolute: 0 10*3/uL (ref 0.0–0.2)
Basos: 1 %
EOS (ABSOLUTE): 0.1 10*3/uL (ref 0.0–0.4)
Eos: 2 %
HEMATOCRIT: 44.5 % (ref 37.5–51.0)
Hemoglobin: 14.8 g/dL (ref 13.0–17.7)
IMMATURE GRANULOCYTES: 0 %
Immature Grans (Abs): 0 10*3/uL (ref 0.0–0.1)
LYMPHS ABS: 1.4 10*3/uL (ref 0.7–3.1)
Lymphs: 27 %
MCH: 30.1 pg (ref 26.6–33.0)
MCHC: 33.3 g/dL (ref 31.5–35.7)
MCV: 90 fL (ref 79–97)
MONOS ABS: 0.4 10*3/uL (ref 0.1–0.9)
Monocytes: 8 %
NEUTROS PCT: 62 %
Neutrophils Absolute: 3.2 10*3/uL (ref 1.4–7.0)
PLATELETS: 180 10*3/uL (ref 150–379)
RBC: 4.92 x10E6/uL (ref 4.14–5.80)
RDW: 13.3 % (ref 12.3–15.4)
WBC: 5.1 10*3/uL (ref 3.4–10.8)

## 2017-04-01 LAB — BMP8+EGFR
BUN / CREAT RATIO: 18 (ref 10–24)
BUN: 17 mg/dL (ref 8–27)
CO2: 26 mmol/L (ref 20–29)
Calcium: 9.5 mg/dL (ref 8.6–10.2)
Chloride: 105 mmol/L (ref 96–106)
Creatinine, Ser: 0.96 mg/dL (ref 0.76–1.27)
GFR calc Af Amer: 95 mL/min/{1.73_m2} (ref >59)
GFR calc non Af Amer: 82 mL/min/{1.73_m2} (ref >59)
GLUCOSE: 126 mg/dL — AB (ref 65–99)
POTASSIUM: 4.4 mmol/L (ref 3.5–5.2)
SODIUM: 143 mmol/L (ref 134–144)

## 2017-04-01 LAB — LIPID PANEL
CHOL/HDL RATIO: 2 ratio (ref 0.0–5.0)
Cholesterol, Total: 83 mg/dL — ABNORMAL LOW (ref 100–199)
HDL: 41 mg/dL (ref >39)
LDL Calculated: 29 mg/dL (ref 0–99)
TRIGLYCERIDES: 67 mg/dL (ref 0–149)
VLDL Cholesterol Cal: 13 mg/dL (ref 5–40)

## 2017-04-01 LAB — VITAMIN D 25 HYDROXY (VIT D DEFICIENCY, FRACTURES): VIT D 25 HYDROXY: 38.7 ng/mL (ref 30.0–100.0)

## 2017-04-01 NOTE — Telephone Encounter (Signed)
Please review for treadmill  

## 2017-04-04 LAB — SPECIMEN STATUS REPORT

## 2017-04-04 LAB — HGB A1C W/O EAG: Hgb A1c MFr Bld: 6.3 % — ABNORMAL HIGH (ref 4.8–5.6)

## 2017-04-07 ENCOUNTER — Other Ambulatory Visit: Payer: Self-pay | Admitting: Family Medicine

## 2017-04-10 NOTE — Telephone Encounter (Signed)
Okay to schedule patient for stress treadmill as long as he is physically capable

## 2017-04-10 NOTE — Telephone Encounter (Signed)
Treadmill scheduled for 08/2 @ 9:30. Instructions placed in the mail.

## 2017-04-14 DIAGNOSIS — Z1211 Encounter for screening for malignant neoplasm of colon: Secondary | ICD-10-CM | POA: Diagnosis not present

## 2017-04-14 DIAGNOSIS — K5730 Diverticulosis of large intestine without perforation or abscess without bleeding: Secondary | ICD-10-CM | POA: Diagnosis not present

## 2017-04-14 DIAGNOSIS — Z8371 Family history of colonic polyps: Secondary | ICD-10-CM | POA: Diagnosis not present

## 2017-05-01 ENCOUNTER — Ambulatory Visit: Payer: BLUE CROSS/BLUE SHIELD

## 2017-05-02 ENCOUNTER — Ambulatory Visit (INDEPENDENT_AMBULATORY_CARE_PROVIDER_SITE_OTHER): Payer: BLUE CROSS/BLUE SHIELD

## 2017-05-02 DIAGNOSIS — E7800 Pure hypercholesterolemia, unspecified: Secondary | ICD-10-CM

## 2017-05-02 DIAGNOSIS — I10 Essential (primary) hypertension: Secondary | ICD-10-CM | POA: Diagnosis not present

## 2017-05-07 LAB — EXERCISE TOLERANCE TEST
CHL CUP MPHR: 154 {beats}/min
CHL RATE OF PERCEIVED EXERTION: 7
CSEPEDS: 22 s
CSEPHR: 86 %
Estimated workload: 8.4 METS
Exercise duration (min): 7 min
Peak HR: 133 {beats}/min
Rest HR: 63 {beats}/min

## 2017-05-19 ENCOUNTER — Other Ambulatory Visit: Payer: Self-pay | Admitting: Family Medicine

## 2017-05-22 ENCOUNTER — Encounter: Payer: Self-pay | Admitting: *Deleted

## 2017-05-28 ENCOUNTER — Emergency Department (HOSPITAL_COMMUNITY)
Admission: EM | Admit: 2017-05-28 | Discharge: 2017-05-29 | Disposition: A | Discharge status: Home / Self Care | Payer: BLUE CROSS/BLUE SHIELD | Source: Home / Self Care

## 2017-05-28 ENCOUNTER — Encounter (HOSPITAL_COMMUNITY): Payer: Self-pay | Admitting: Emergency Medicine

## 2017-05-28 DIAGNOSIS — Z85820 Personal history of malignant melanoma of skin: Secondary | ICD-10-CM | POA: Diagnosis not present

## 2017-05-28 DIAGNOSIS — Z5321 Procedure and treatment not carried out due to patient leaving prior to being seen by health care provider: Secondary | ICD-10-CM

## 2017-05-28 DIAGNOSIS — R109 Unspecified abdominal pain: Secondary | ICD-10-CM | POA: Insufficient documentation

## 2017-05-28 DIAGNOSIS — K567 Ileus, unspecified: Secondary | ICD-10-CM | POA: Diagnosis not present

## 2017-05-28 DIAGNOSIS — I10 Essential (primary) hypertension: Secondary | ICD-10-CM | POA: Diagnosis not present

## 2017-05-28 LAB — CBC
HCT: 45.8 % (ref 39.0–52.0)
Hemoglobin: 15.5 g/dL (ref 13.0–17.0)
MCH: 30.3 pg (ref 26.0–34.0)
MCHC: 33.8 g/dL (ref 30.0–36.0)
MCV: 89.6 fL (ref 78.0–100.0)
Platelets: 182 10*3/uL (ref 150–400)
RBC: 5.11 MIL/uL (ref 4.22–5.81)
RDW: 13.4 % (ref 11.5–15.5)
WBC: 10.6 10*3/uL — AB (ref 4.0–10.5)

## 2017-05-28 LAB — URINALYSIS, ROUTINE W REFLEX MICROSCOPIC
Bilirubin Urine: NEGATIVE
GLUCOSE, UA: NEGATIVE mg/dL
Hgb urine dipstick: NEGATIVE
Ketones, ur: NEGATIVE mg/dL
LEUKOCYTES UA: NEGATIVE
Nitrite: NEGATIVE
PH: 6 (ref 5.0–8.0)
Protein, ur: NEGATIVE mg/dL
SPECIFIC GRAVITY, URINE: 1.013 (ref 1.005–1.030)

## 2017-05-28 LAB — COMPREHENSIVE METABOLIC PANEL
ALT: 28 U/L (ref 17–63)
AST: 21 U/L (ref 15–41)
Albumin: 4.3 g/dL (ref 3.5–5.0)
Alkaline Phosphatase: 108 U/L (ref 38–126)
Anion gap: 9 (ref 5–15)
BUN: 14 mg/dL (ref 6–20)
CHLORIDE: 102 mmol/L (ref 101–111)
CO2: 27 mmol/L (ref 22–32)
Calcium: 9.9 mg/dL (ref 8.9–10.3)
Creatinine, Ser: 1.03 mg/dL (ref 0.61–1.24)
GFR calc Af Amer: >60 mL/min (ref >60)
Glucose, Bld: 142 mg/dL — ABNORMAL HIGH (ref 65–99)
POTASSIUM: 3.8 mmol/L (ref 3.5–5.1)
Sodium: 138 mmol/L (ref 135–145)
Total Bilirubin: 1 mg/dL (ref 0.3–1.2)
Total Protein: 7.7 g/dL (ref 6.5–8.1)

## 2017-05-28 LAB — LIPASE, BLOOD: LIPASE: 25 U/L (ref 11–51)

## 2017-05-28 MED ORDER — FENTANYL CITRATE (PF) 100 MCG/2ML IJ SOLN
INTRAMUSCULAR | Status: DC
Start: 2017-05-28 — End: 2017-05-29
  Filled 2017-05-28: qty 2

## 2017-05-28 MED ORDER — FENTANYL CITRATE (PF) 100 MCG/2ML IJ SOLN
50.0000 ug | INTRAMUSCULAR | Status: DC | PRN
  Administered 2017-05-28: 50 ug via NASAL

## 2017-05-28 NOTE — ED Notes (Signed)
Patient told other RN in lobby that he had "passed the blockage" and was going home.  Will d/c.

## 2017-05-28 NOTE — ED Notes (Signed)
Pt became diaphoretic and states that he feels that is going to pass out.

## 2017-05-28 NOTE — ED Triage Notes (Signed)
Reports abdominal pain with distention that started around 1pm today.  Reports history of bowel obstruction and it feels like that.  Denies having any n/d.  Last bm this morning.

## 2017-05-29 ENCOUNTER — Encounter: Payer: Self-pay | Admitting: Family Medicine

## 2017-05-29 ENCOUNTER — Emergency Department (HOSPITAL_BASED_OUTPATIENT_CLINIC_OR_DEPARTMENT_OTHER)
Admission: EM | Admit: 2017-05-29 | Discharge: 2017-05-29 | Disposition: A | Discharge status: Home / Self Care | Payer: BLUE CROSS/BLUE SHIELD | Attending: Emergency Medicine | Admitting: Emergency Medicine

## 2017-05-29 ENCOUNTER — Ambulatory Visit (INDEPENDENT_AMBULATORY_CARE_PROVIDER_SITE_OTHER): Payer: BLUE CROSS/BLUE SHIELD | Admitting: Family Medicine

## 2017-05-29 ENCOUNTER — Ambulatory Visit (INDEPENDENT_AMBULATORY_CARE_PROVIDER_SITE_OTHER): Payer: BLUE CROSS/BLUE SHIELD

## 2017-05-29 ENCOUNTER — Emergency Department (HOSPITAL_BASED_OUTPATIENT_CLINIC_OR_DEPARTMENT_OTHER): Payer: BLUE CROSS/BLUE SHIELD

## 2017-05-29 ENCOUNTER — Encounter (HOSPITAL_BASED_OUTPATIENT_CLINIC_OR_DEPARTMENT_OTHER): Payer: Self-pay | Admitting: *Deleted

## 2017-05-29 VITALS — BP 128/78 | HR 66 | Temp 97.6°F | Ht 73.0 in | Wt 230.8 lb

## 2017-05-29 DIAGNOSIS — R109 Unspecified abdominal pain: Secondary | ICD-10-CM | POA: Diagnosis not present

## 2017-05-29 DIAGNOSIS — K567 Ileus, unspecified: Secondary | ICD-10-CM | POA: Diagnosis not present

## 2017-05-29 DIAGNOSIS — Z85820 Personal history of malignant melanoma of skin: Secondary | ICD-10-CM | POA: Insufficient documentation

## 2017-05-29 DIAGNOSIS — I10 Essential (primary) hypertension: Secondary | ICD-10-CM | POA: Insufficient documentation

## 2017-05-29 LAB — CBC WITH DIFFERENTIAL/PLATELET
BASOS ABS: 0 10*3/uL (ref 0.0–0.1)
Basophils Relative: 0 %
Eosinophils Absolute: 0 10*3/uL (ref 0.0–0.7)
Eosinophils Relative: 0 %
HEMATOCRIT: 44.4 % (ref 39.0–52.0)
HEMOGLOBIN: 15.1 g/dL (ref 13.0–17.0)
LYMPHS PCT: 18 %
Lymphs Abs: 1.7 10*3/uL (ref 0.7–4.0)
MCH: 30.3 pg (ref 26.0–34.0)
MCHC: 34 g/dL (ref 30.0–36.0)
MCV: 89.2 fL (ref 78.0–100.0)
MONO ABS: 0.8 10*3/uL (ref 0.1–1.0)
MONOS PCT: 9 %
NEUTROS ABS: 6.7 10*3/uL (ref 1.7–7.7)
NEUTROS PCT: 73 %
Platelets: 170 10*3/uL (ref 150–400)
RBC: 4.98 MIL/uL (ref 4.22–5.81)
RDW: 13.5 % (ref 11.5–15.5)
WBC: 9.2 10*3/uL (ref 4.0–10.5)

## 2017-05-29 LAB — COMPREHENSIVE METABOLIC PANEL
ALBUMIN: 4.2 g/dL (ref 3.5–5.0)
ALK PHOS: 96 U/L (ref 38–126)
ALT: 22 U/L (ref 17–63)
AST: 18 U/L (ref 15–41)
Anion gap: 8 (ref 5–15)
BILIRUBIN TOTAL: 1 mg/dL (ref 0.3–1.2)
BUN: 17 mg/dL (ref 6–20)
CALCIUM: 9.5 mg/dL (ref 8.9–10.3)
CO2: 27 mmol/L (ref 22–32)
Chloride: 103 mmol/L (ref 101–111)
Creatinine, Ser: 0.9 mg/dL (ref 0.61–1.24)
GFR calc Af Amer: >60 mL/min (ref >60)
GLUCOSE: 110 mg/dL — AB (ref 65–99)
POTASSIUM: 3.7 mmol/L (ref 3.5–5.1)
Sodium: 138 mmol/L (ref 135–145)
TOTAL PROTEIN: 7.3 g/dL (ref 6.5–8.1)

## 2017-05-29 LAB — I-STAT CG4 LACTIC ACID, ED: Lactic Acid, Venous: 1.41 mmol/L (ref 0.5–1.9)

## 2017-05-29 LAB — LIPASE, BLOOD: LIPASE: 20 U/L (ref 11–51)

## 2017-05-29 MED ORDER — IOPAMIDOL (ISOVUE-300) INJECTION 61%
100.0000 mL | Freq: Once | INTRAVENOUS | Status: AC | PRN
  Administered 2017-05-29: 100 mL via INTRAVENOUS

## 2017-05-29 NOTE — Progress Notes (Signed)
   HPI  Patient presents today here with abdominal pain.  States that his abdominal pain started out new or 1:00 yesterday. He describes generalized abdominal pain characteristic of his previous obstructions. He has a history of Nissen fundoplication in the 0321Y, he has had 4-5 episodes of small bowel obstruction.  He is tolerating small sips of fluid without nausea. He has had 2 or 3 bowel movements in the last 24 hours. He sought care in the emergency room last night but left after he had a bowel movement and improvement of pain.  The pain returned today in a  much less severe way.  PMH: Smoking status noted ROS: Per HPI  Objective: BP 128/78 (BP Location: Left Arm, Patient Position: Sitting)   Pulse 66   Temp 97.6 F (36.4 C) (Oral)   Ht 6\' 1"  (1.854 m)   Wt 230 lb 12.8 oz (104.7 kg)   BMI 30.45 kg/m  Gen: NAD, alert, cooperative with exam HEENT: NCAT CV: RRR, good S1/S2, no murmur Resp: CTABL, no wheezes, non-labored Abd: SNTND, BS present, no guarding or organomegaly Ext: No edema, warm Neuro: Alert and oriented, No gross deficits  Assessment and plan:  # Abdominal pain Likely small bowel obstruction X-ray with multiple air-fluid levels Discussed with patient that the best course of action is likely evaluation in the emergency room to consider a CT scan. Patient will present to the emergency room Return to clinic as needed. Labs from last night reviewed with only elevated WBC    Orders Placed This Encounter  Procedures  . DG Abd 2 Views    Standing Status:   Future    Number of Occurrences:   1    Standing Expiration Date:   07/29/2018    Order Specific Question:   Reason for Exam (SYMPTOM  OR DIAGNOSIS REQUIRED)    Answer:   abd pain    Order Specific Question:   Preferred imaging location?    Answer:   Internal    Order Specific Question:   Radiology Contrast Protocol - do NOT remove file path    Answer:    [email or web address]     Laroy Apple, MD [facility] 05/29/2017, 5:09 PM

## 2017-05-29 NOTE — ED Provider Notes (Signed)
Emergency Department Provider Note   I have reviewed the triage vital signs and the nursing notes.   HISTORY  Chief Complaint Abdominal Pain   HPI [name] is a 66 y.o. male with a past history of multiple small bowel shortness is a presents to the emergency department today secondary to having had decreased bowel movements yesterday and some pain last night. He went to the emergency room and waited for a few hours and then had a bowel movement and felt prickly 5. He went home and went to sleep and melena i.e. had a acute onset of abdominal pain again. Today he had multiple bowel movements that were softer than normal however also passed gas with no nausea or vomiting and his pain improved. He went to see his primary doctor who did an x-ray that showed concerning findings for bowel obstruction and was transferred here for further evaluation. On my examination patient states that he is relatively pain-free. Has been passing gas even since he's been in the department. He states he does not have any chest pain, shortness for breath, fever, cough, rash, muscle skeletal pain or any other associated or modifying symptoms.   Past Medical History:  Diagnosis Date  . Arthritis   . Atypical small acinar proliferation of prostate   . Benign paroxysmal positional vertigo   . Elevated PSA   . Essential hypertension, benign   . GERD (gastroesophageal reflux disease)   . History of basal cell carcinoma excision    few times  . History of hiatal hernia   . History of kidney stones   . History of melanoma excision    x1  localized  . History of small bowel obstruction    2009  &  2013--  both times resolved without surgical intervevtion  . Hyperlipidemia   . LAFB (left anterior fascicular block)   . Prediabetes     Patient Active Problem List   Diagnosis Date Noted  . Elevated PSA 03/19/2016  . Pre-diabetes 03/18/2014  . SBO (small bowel obstruction) ([place]) 03/22/2012  . Hypertension    . Hyperlipidemia   . Reflux   . Vertigo, intermittent   . Other testicular hypofunction     Past Surgical History:  Procedure Laterality Date  . GASTRIC FUNDOPLICATION  4627'O  . LUMBAR DISC SURGERY  x3   last one 1980's  . PROSTATE BIOPSY N/A 11/21/2015   Procedure: PROSTATE BIOPSY AND ULTRASOUND;  Surgeon: Irine Seal, MD;  Location: [facility];  Service: Urology;  Laterality: N/A;    Current Outpatient Rx  . Order #: [id number] Class: Normal  . Order #: [id number] Class: Historical Med  . Order #: [id number] Class: Historical Med  . Order #: [id number] Class: Normal  . Order #: [id number] Class: Normal  . Order #: [id number] Class: Normal    Allergies Penicillins  Family History  Problem Relation Age of Onset  . Hypertension Mother   . Melanoma Mother   . Hypertension Father   . Diabetes Father     Social History Social History  Substance Use Topics  . Smoking status: Never Smoker  . Smokeless tobacco: Never Used  . Alcohol use Yes     Comment: rare    Review of Systems  All other systems negative except as documented in the HPI. All pertinent positives and negatives as reviewed in the HPI. ____________________________________________  PHYSICAL EXAM:  VITAL SIGNS: ED Triage Vitals  Enc Vitals Group     BP 05/29/17 1755 137/81  Pulse Rate 05/29/17 1755 67     Resp 05/29/17 1755 16     Temp 05/29/17 1755 98.7 F (37.1 C)     Temp Source 05/29/17 1755 Oral     SpO2 05/29/17 1755 99 %     Weight 05/29/17 1756 230 lb (104.3 kg)     Height 05/29/17 1756 6\' 1"  (1.854 m)     Head Circumference --      Peak Flow --      Pain Score 05/29/17 1754 2     Pain Loc --      Pain Edu? --      Excl. in [place]? --     Constitutional: Alert and oriented. Well appearing and in no acute distress. Eyes: Conjunctivae are normal. PERRL. EOMI. Head: Atraumatic. Nose: No congestion/rhinnorhea. Mouth/Throat: Mucous membranes are moist.  Oropharynx  non-erythematous. Neck: No stridor.  No meningeal signs.   Cardiovascular: Normal rate, regular rhythm. Good peripheral circulation. Grossly normal heart sounds.   Respiratory: Normal respiratory effort.  No retractions. Lungs CTAB. Gastrointestinal: Soft and nontender. No distention.  Musculoskeletal: No lower extremity tenderness nor edema. No gross deformities of extremities. Neurologic:  Normal speech and language. No gross focal neurologic deficits are appreciated.  Skin:  Skin is warm, dry and intact. No rash noted.  ____________________________________________   LABS (all labs ordered are listed, but only abnormal results are displayed)  Labs Reviewed  COMPREHENSIVE METABOLIC PANEL - Abnormal; Notable for the following:       Result Value   Glucose, Bld 110 (*)    All other components within normal limits  CBC WITH DIFFERENTIAL/PLATELET  LIPASE, BLOOD  LACTIC ACID, PLASMA  LACTIC ACID, PLASMA  I-STAT CG4 LACTIC ACID, ED   ______________________________________________________________  RADIOLOGY  Ct Abdomen Pelvis W Contrast  Result Date: 05/29/2017 CLINICAL DATA:  Acute lower abdominal pain. EXAM: CT ABDOMEN AND PELVIS WITH CONTRAST TECHNIQUE: Multidetector CT imaging of the abdomen and pelvis was performed using the standard protocol following bolus administration of intravenous contrast. CONTRAST:  165mL ISOVUE-300 IOPAMIDOL (ISOVUE-300) INJECTION 61% COMPARISON:  CT scan of November 15, 2013.  Radiographs of same day. FINDINGS: Lower chest: No acute abnormality. Hepatobiliary: No gallstones are noted. Fatty infiltration of the liver is noted. Pancreas: Unremarkable. No pancreatic ductal dilatation or surrounding inflammatory changes. Spleen: Normal in size without focal abnormality. Adrenals/Urinary Tract: Adrenal glands are unremarkable. Kidneys appear normal. No hydronephrosis or renal obstruction is noted. No ureteral dilatation is noted. However, there is a possible 7 mm  calculus seen in the distal right ureter which does not cause significant ureteral dilatation. Urinary bladder is unremarkable. Stomach/Bowel: The stomach is unremarkable. The appendix appears normal. There is no evidence of colonic dilatation. Mildly dilated small bowel loops are noted which may simply represent ileus. Vascular/Lymphatic: Aortic atherosclerosis. No enlarged abdominal or pelvic lymph nodes. Reproductive: Moderate prostatic enlargement is noted. Other: No abdominal wall hernia or abnormality. No abdominopelvic ascites. Musculoskeletal: No acute or significant osseous findings. IMPRESSION: Fatty infiltration of the liver. Possible 7 mm distal right ureteral calculus is noted which does not result in significant ureteral dilatation or hydronephrosis. Aortic atherosclerosis. Moderate prostatic enlargement. Mildly dilated small bowel loops are noted which are most consistent with ileus. No definite evidence of obstruction is noted. Electronically Signed   By: Marijo Conception, M.D.   On: 05/29/2017 20:42   Dg Abd 2 Views  Result Date: 05/29/2017 CLINICAL DATA:  66 year old male with history of abdominal pain. EXAM: ABDOMEN - 2  VIEW COMPARISON:  Abdominal radiograph 03/24/2012. FINDINGS: There is a small amount of bowel gas throughout the colon. However, there are multiple dilated loops of small bowel measuring up to 5.2 cm, and multiple air-fluid levels noted within these dilated loops of small bowel on the upright projection. No definite pneumoperitoneum. IMPRESSION: 1. Findings are compatible with small bowel obstruction, which may be early or partial. These results will be called to the ordering clinician or representative by the Radiologist Assistant, and communication documented in the PACS or zVision Dashboard. Electronically Signed   By: Vinnie Langton M.D.   On: 05/29/2017 16:52   ____________________________________________   PROCEDURES  Procedure(s) performed:    Procedures ____________________________________________   INITIAL IMPRESSION / ASSESSMENT AND PLAN / ED COURSE  Pertinent labs & imaging results that were available during my care of the patient were reviewed by me and considered in my medical decision making (see chart for details).  Workup negative for bowel obstruction. Likely just ileus. Patient with multiple bowel movements today so I have low suspicion for any emergent causes at this time. Lactic acid, lipase and all other labs are also normal. Patient stable for discharge this time will follow with PCP as needed.  ____________________________________________  FINAL CLINICAL IMPRESSION(S) / ED DIAGNOSES  Final diagnoses:  Abdominal pain, unspecified abdominal location  Ileus ([place])    MEDICATIONS GIVEN DURING THIS VISIT:  Medications  iopamidol (ISOVUE-300) 61 % injection 100 mL (100 mLs Intravenous Contrast Given 05/29/17 2006)    NEW OUTPATIENT MEDICATIONS STARTED DURING THIS VISIT:  Discharge Medication List as of 05/29/2017 10:35 PM      Note:  This document was prepared using Dragon voice recognition software and may include unintentional dictation errors.    Landis Cassaro, Corene Cornea, MD 05/30/17 [phone]

## 2017-05-29 NOTE — ED Notes (Signed)
ED Provider at bedside. 

## 2017-05-29 NOTE — ED Triage Notes (Signed)
Pt reports abd pain starting yesterday consistent with past bowel obstruction. [facility] but left after triage because he had a BM and Sx improved and he was told there was a lengthy wait. Today he went to his doctor and was sent here for further evaluation for possible partial obstruction. Reports some pain at present and had a BM upon arrival

## 2017-06-03 ENCOUNTER — Other Ambulatory Visit: Payer: Self-pay | Admitting: Family Medicine

## 2017-07-14 ENCOUNTER — Other Ambulatory Visit: Payer: Self-pay | Admitting: Family Medicine

## 2017-08-01 ENCOUNTER — Ambulatory Visit (INDEPENDENT_AMBULATORY_CARE_PROVIDER_SITE_OTHER): Payer: BLUE CROSS/BLUE SHIELD | Admitting: *Deleted

## 2017-08-01 DIAGNOSIS — Z23 Encounter for immunization: Secondary | ICD-10-CM | POA: Diagnosis not present

## 2017-08-18 ENCOUNTER — Other Ambulatory Visit: Payer: Self-pay | Admitting: Family Medicine

## 2017-10-01 ENCOUNTER — Ambulatory Visit: Payer: BLUE CROSS/BLUE SHIELD | Admitting: Family Medicine

## 2017-10-08 ENCOUNTER — Other Ambulatory Visit: Payer: Self-pay | Admitting: Family Medicine

## 2017-10-10 ENCOUNTER — Ambulatory Visit: Payer: BLUE CROSS/BLUE SHIELD | Admitting: Family Medicine

## 2017-10-10 ENCOUNTER — Encounter: Payer: Self-pay | Admitting: Family Medicine

## 2017-10-10 VITALS — BP 150/82 | HR 59 | Temp 97.7°F | Ht 73.0 in | Wt 237.0 lb

## 2017-10-10 DIAGNOSIS — E7800 Pure hypercholesterolemia, unspecified: Secondary | ICD-10-CM | POA: Diagnosis not present

## 2017-10-10 DIAGNOSIS — Z1211 Encounter for screening for malignant neoplasm of colon: Secondary | ICD-10-CM

## 2017-10-10 DIAGNOSIS — R972 Elevated prostate specific antigen [PSA]: Secondary | ICD-10-CM

## 2017-10-10 DIAGNOSIS — Z23 Encounter for immunization: Secondary | ICD-10-CM | POA: Diagnosis not present

## 2017-10-10 DIAGNOSIS — L570 Actinic keratosis: Secondary | ICD-10-CM

## 2017-10-10 DIAGNOSIS — I10 Essential (primary) hypertension: Secondary | ICD-10-CM | POA: Diagnosis not present

## 2017-10-10 DIAGNOSIS — R351 Nocturia: Secondary | ICD-10-CM

## 2017-10-10 DIAGNOSIS — E559 Vitamin D deficiency, unspecified: Secondary | ICD-10-CM | POA: Diagnosis not present

## 2017-10-10 MED ORDER — ROSUVASTATIN CALCIUM 40 MG PO TABS: ORAL_TABLET | ORAL | 3 refills | Status: DC

## 2017-10-10 MED ORDER — AMLODIPINE BESYLATE 5 MG PO TABS: ORAL_TABLET | ORAL | 3 refills | Status: DC

## 2017-10-10 MED ORDER — METOPROLOL SUCCINATE ER 100 MG PO TB24: ORAL_TABLET | ORAL | 3 refills | Status: DC

## 2017-10-10 MED ORDER — LISINOPRIL 20 MG PO TABS: 20.0000 mg | ORAL_TABLET | Freq: Every day | ORAL | 3 refills | Status: DC

## 2017-10-10 NOTE — Patient Instructions (Addendum)
Medicare Annual Wellness Visit  [facility] and the medical providers at [facility] strive to bring you the best medical care.  In doing so we not only want to address your current medical conditions and concerns but also to detect new conditions early and prevent illness, disease and health-related problems.    Medicare offers a yearly Wellness Visit which allows our clinical staff to assess your need for preventative services including immunizations, lifestyle education, counseling to decrease risk of preventable diseases and screening for fall risk and other medical concerns.    This visit is provided free of charge (no copay) for all Medicare recipients. The clinical pharmacists at [facility] have begun to conduct these Wellness Visits which will also include a thorough review of all your medications.    As you primary medical provider recommend that you make an appointment for your Annual Wellness Visit if you have not done so already this year.  You may set up this appointment before you leave today or you may call back [phone]) and schedule an appointment.  Please make sure when you call that you mention that you are scheduling your Annual Wellness Visit with the clinical pharmacist so that the appointment may be made for the proper length of time.     Continue current medications. Continue good therapeutic lifestyle changes which include good diet and exercise. Fall precautions discussed with patient. If an FOBT was given today- please return it to our front desk. If you are over 86 years old - you may need Prevnar 66 or the adult Pneumonia vaccine.  **Flu shots are available--- please call and schedule a FLU-CLINIC appointment**  After your visit with [place] today you will receive a survey in the mail or online from [employer] regarding your care with [place]. Please take a moment to fill this out. Your feedback is very  important to [place] as you can help [place] better understand your patient needs as well as improve your experience and satisfaction. WE CARE ABOUT YOU!!!   Watch sodium intake Check blood pressure readings at home and record these and bring them to the office for the nurse to recheck your blood pressure here in the next 2-4 weeks

## 2017-10-10 NOTE — Addendum Note (Signed)
Addended by: Zannie Cove on: 10/10/2017 02:45 PM   Modules accepted: Orders

## 2017-10-10 NOTE — Progress Notes (Signed)
Subjective:    Patient ID: [name], male    DOB: 1951-07-05, 67 y.o.   MRN: [id number]  HPI Pt here for follow up and management of chronic medical problems which includes hyperlipidemia and hypertension. He is taking medication regularly.  [name] comes in today for his regular follow-up with a history of hypertension hyperlipidemia and vitamin D deficiency.  The patient is pleasant and doing well.  He sees Dr. Jeffie [name] on a yearly basis and that visit comes up in February because of an elevated PSA which by the way is improving.  He denies his any chest pain pressure or tightness and recently had a stress test that was negative in August of this past year.  He denies any shortness of breath anymore than usual.  He has no nausea vomiting diarrhea blood in the stool or black tarry bowel movements that he is aware of and did have a colonoscopy in July of this past year and was told he would not need another one he thinks for 10 years but he is not certain about that.  He is passing his water without problems although he does have to get up twice nightly to void.  He does see the urologist again in February so we will have him to discuss the nocturia with him at that time.  He is due to get a Pneumovax shot today.  The family history is positive for dementia and his mother stroke in his father and elevated blood sugar and diabetes and 1 of his brothers.  He is requesting refills on all of his medicines today.  The patient did have his last eye exam in June 2018.  The patient is also concerned about areas on his scalp which appear to be actinic keratoses.  He says that he checks blood pressures at home and they run in the range from the 120s up to the mid 462V for the systolic number and they are usually higher initially and then get lower as he repeats checking his blood pressure.     Patient Active Problem List   Diagnosis Date Noted  . Elevated PSA 03/19/2016  . Pre-diabetes 03/18/2014  . SBO (small  bowel obstruction) ([place]) 03/22/2012  . Hypertension   . Hyperlipidemia   . Reflux   . Vertigo, intermittent   . Other testicular hypofunction    Outpatient Encounter Medications as of 10/10/2017  Medication Sig  . amLODipine (NORVASC) 5 MG tablet TAKE 1 & 1/2 TABLET IN THE MORNING  . aspirin 81 MG tablet Take 81 mg by mouth daily.  . Cholecalciferol (VITAMIN D) 2000 UNITS CAPS Take 1-2 capsules by mouth daily. Takes 1 capsule daily= 2000 units; Except on Saturday and Sunday patient takes 2 capsules= 4000 units  . lisinopril (PRINIVIL,ZESTRIL) 20 MG tablet TAKE 1 TABLET DAILY  . metoprolol succinate (TOPROL-XL) 100 MG 24 hr tablet TAKE (1) TABLET DAILY IN THE MORNING.  . rosuvastatin (CRESTOR) 40 MG tablet TAKE (1) TABLET DAILY AS DIRECTED.   No facility-administered encounter medications on file as of 10/10/2017.      Review of Systems  Constitutional: Negative.   HENT: Negative.   Eyes: Negative.   Respiratory: Negative.   Cardiovascular: Negative.   Gastrointestinal: Negative.   Endocrine: Negative.   Genitourinary: Negative.   Musculoskeletal: Negative.   Skin: Negative.   Allergic/Immunologic: Negative.   Neurological: Negative.   Hematological: Negative.   Psychiatric/Behavioral: Negative.        Objective:   Physical Exam  Constitutional: He is oriented to person, place, and time. He appears well-developed and well-nourished. No distress.  The patient is pleasant alert and calm.  HENT:  Head: Normocephalic and atraumatic.  Right Ear: External ear normal.  Left Ear: External ear normal.  Nose: Nose normal.  Mouth/Throat: Oropharynx is clear and moist. No oropharyngeal exudate.  Eyes: Conjunctivae and EOM are normal. Pupils are equal, round, and reactive to light. Right eye exhibits no discharge. Left eye exhibits no discharge. No scleral icterus.  Neck: Normal range of motion. Neck supple. No thyromegaly present.  No bruits thyromegaly or anterior cervical  adenopathy  Cardiovascular: Normal rate, regular rhythm, normal heart sounds and intact distal pulses.  No murmur heard. The heart has a regular rate and rhythm at 60/min  Pulmonary/Chest: Effort normal and breath sounds normal. No respiratory distress. He has no wheezes. He has no rales. He exhibits no tenderness.  Clear anteriorly and posteriorly and no axillary adenopathy or chest wall masses  Abdominal: Soft. Bowel sounds are normal. He exhibits no mass. There is no tenderness. There is no rebound and no guarding.  Midline scar from hiatal hernia repair from 40 years ago.  No liver or spleen enlargement no epigastric tenderness no masses no bruits and no inguinal adenopathy  Genitourinary:  Genitourinary Comments: This is done yearly by the urologist and will be done again next month.  Musculoskeletal: Normal range of motion. He exhibits no edema.  Lymphadenopathy:    He has no cervical adenopathy.  Neurological: He is alert and oriented to person, place, and time. He has normal reflexes. No cranial nerve deficit.  Skin: Skin is warm and dry. No rash noted.  Multiple actinic keratoses on scalp that patient is concerned about.  We will do cryotherapy on 3 of these.  Psychiatric: He has a normal mood and affect. His behavior is normal. Judgment and thought content normal.  Nursing note and vitals reviewed.  BP (!) 151/75 (BP Location: Left Arm)   Pulse (!) 59   Temp 97.7 F (36.5 C) (Oral)   Ht 6' 1" (1.854 m)   Wt 237 lb (107.5 kg)   BMI 31.27 kg/m   Repeat blood pressure right arm large cuff 150/82      Assessment & Plan:  1. Vitamin D deficiency -Continue with vitamin D replacement pending results of lab work - CBC with Differential/Platelet - VITAMIN D 25 Hydroxy (Vit-D Deficiency, Fractures)  2. Pure hypercholesterolemia -Continue with statin therapy exercise diet pending results of lab work - CBC with Differential/Platelet - Lipid panel  3. Hypertension -The blood  pressure today is slightly elevated and was elevated on 2 occasions.  The patient says his home readings are better.  He will bring home readings back for review in 2-4 weeks and have the nurse recheck his blood pressure here at that time while watching his sodium intake more closely. - BMP8+EGFR - CBC with Differential/Platelet - Hepatic function panel  4. Elevated PSA -Continue to follow-up with urology and discuss nocturia with him at the next visit. - CBC with Differential/Platelet  5. Nocturia -Discussed with urology at next visit.  6. Actinic keratoses -Cryotherapy on 3 of the actinic keratotic lesions on his scalp without problems done today.   Meds ordered this encounter  Medications  . amLODipine (NORVASC) 5 MG tablet    Sig: TAKE 1 & 1/2 TABLET IN THE MORNING    Dispense:  135 tablet    Refill:  3  . lisinopril (PRINIVIL,ZESTRIL) 20  MG tablet    Sig: Take 1 tablet (20 mg total) by mouth daily.    Dispense:  90 tablet    Refill:  3  . metoprolol succinate (TOPROL-XL) 100 MG 24 hr tablet    Sig: TAKE (1) TABLET DAILY IN THE MORNING.    Dispense:  90 tablet    Refill:  3  . rosuvastatin (CRESTOR) 40 MG tablet    Sig: TAKE (1) TABLET DAILY AS DIRECTED.    Dispense:  90 tablet    Refill:  3   Patient Instructions                       Medicare Annual Wellness Visit  [facility] and the medical providers at [facility] strive to bring you the best medical care.  In doing so we not only want to address your current medical conditions and concerns but also to detect new conditions early and prevent illness, disease and health-related problems.    Medicare offers a yearly Wellness Visit which allows our clinical staff to assess your need for preventative services including immunizations, lifestyle education, counseling to decrease risk of preventable diseases and screening for fall risk and other medical concerns.    This visit is provided free of  charge (no copay) for all Medicare recipients. The clinical pharmacists at [facility] have begun to conduct these Wellness Visits which will also include a thorough review of all your medications.    As you primary medical provider recommend that you make an appointment for your Annual Wellness Visit if you have not done so already this year.  You may set up this appointment before you leave today or you may call back ([phone]) and schedule an appointment.  Please make sure when you call that you mention that you are scheduling your Annual Wellness Visit with the clinical pharmacist so that the appointment may be made for the proper length of time.     Continue current medications. Continue good therapeutic lifestyle changes which include good diet and exercise. Fall precautions discussed with patient. If an FOBT was given today- please return it to our front desk. If you are over 46 years old - you may need Prevnar 62 or the adult Pneumonia vaccine.  **Flu shots are available--- please call and schedule a FLU-CLINIC appointment**  After your visit with [place] today you will receive a survey in the mail or online from [employer] regarding your care with [place]. Please take a moment to fill this out. Your feedback is very important to [place] as you can help [place] better understand your patient needs as well as improve your experience and satisfaction. WE CARE ABOUT YOU!!!   Watch sodium intake Check blood pressure readings at home and record these and bring them to the office for the nurse to recheck your blood pressure here in the next 2-4 weeks  Arrie Senate MD

## 2017-10-11 LAB — BMP8+EGFR
BUN / CREAT RATIO: 23 (ref 10–24)
BUN: 19 mg/dL (ref 8–27)
CO2: 25 mmol/L (ref 20–29)
CREATININE: 0.84 mg/dL (ref 0.76–1.27)
Calcium: 9.3 mg/dL (ref 8.6–10.2)
Chloride: 102 mmol/L (ref 96–106)
GFR calc Af Amer: 105 mL/min/{1.73_m2} (ref >59)
GFR, EST NON AFRICAN AMERICAN: 91 mL/min/{1.73_m2} (ref >59)
Glucose: 126 mg/dL — ABNORMAL HIGH (ref 65–99)
Potassium: 4.5 mmol/L (ref 3.5–5.2)
SODIUM: 142 mmol/L (ref 134–144)

## 2017-10-11 LAB — LIPID PANEL
CHOL/HDL RATIO: 2 ratio (ref 0.0–5.0)
Cholesterol, Total: 87 mg/dL — ABNORMAL LOW (ref 100–199)
HDL: 44 mg/dL (ref >39)
LDL CALC: 27 mg/dL (ref 0–99)
TRIGLYCERIDES: 81 mg/dL (ref 0–149)
VLDL CHOLESTEROL CAL: 16 mg/dL (ref 5–40)

## 2017-10-11 LAB — HEPATIC FUNCTION PANEL
ALT: 22 IU/L (ref 0–44)
AST: 15 IU/L (ref 0–40)
Albumin: 4.1 g/dL (ref 3.6–4.8)
Alkaline Phosphatase: 126 IU/L — ABNORMAL HIGH (ref 39–117)
BILIRUBIN TOTAL: 0.5 mg/dL (ref 0.0–1.2)
Bilirubin, Direct: 0.17 mg/dL (ref 0.00–0.40)
Total Protein: 7.2 g/dL (ref 6.0–8.5)

## 2017-10-11 LAB — CBC WITH DIFFERENTIAL/PLATELET
BASOS: 1 %
Basophils Absolute: 0 10*3/uL (ref 0.0–0.2)
EOS (ABSOLUTE): 0.1 10*3/uL (ref 0.0–0.4)
EOS: 2 %
HEMATOCRIT: 45.3 % (ref 37.5–51.0)
HEMOGLOBIN: 15.2 g/dL (ref 13.0–17.7)
IMMATURE GRANS (ABS): 0 10*3/uL (ref 0.0–0.1)
Immature Granulocytes: 0 %
Lymphocytes Absolute: 1.5 10*3/uL (ref 0.7–3.1)
Lymphs: 28 %
MCH: 30.7 pg (ref 26.6–33.0)
MCHC: 33.6 g/dL (ref 31.5–35.7)
MCV: 92 fL (ref 79–97)
MONOCYTES: 7 %
MONOS ABS: 0.4 10*3/uL (ref 0.1–0.9)
NEUTROS PCT: 62 %
Neutrophils Absolute: 3.3 10*3/uL (ref 1.4–7.0)
Platelets: 175 10*3/uL (ref 150–379)
RBC: 4.95 x10E6/uL (ref 4.14–5.80)
RDW: 14 % (ref 12.3–15.4)
WBC: 5.4 10*3/uL (ref 3.4–10.8)

## 2017-10-11 LAB — VITAMIN D 25 HYDROXY (VIT D DEFICIENCY, FRACTURES): VIT D 25 HYDROXY: 39 ng/mL (ref 30.0–100.0)

## 2017-10-12 LAB — FECAL OCCULT BLOOD, IMMUNOCHEMICAL: Fecal Occult Bld: NEGATIVE

## 2017-11-18 DIAGNOSIS — R972 Elevated prostate specific antigen [PSA]: Secondary | ICD-10-CM | POA: Diagnosis not present

## 2017-11-24 DIAGNOSIS — R972 Elevated prostate specific antigen [PSA]: Secondary | ICD-10-CM | POA: Diagnosis not present

## 2017-11-24 DIAGNOSIS — N401 Enlarged prostate with lower urinary tract symptoms: Secondary | ICD-10-CM | POA: Diagnosis not present

## 2017-11-24 DIAGNOSIS — R3915 Urgency of urination: Secondary | ICD-10-CM | POA: Diagnosis not present

## 2017-11-24 DIAGNOSIS — R350 Frequency of micturition: Secondary | ICD-10-CM | POA: Diagnosis not present

## 2017-11-27 DIAGNOSIS — D2271 Melanocytic nevi of right lower limb, including hip: Secondary | ICD-10-CM | POA: Diagnosis not present

## 2017-11-27 DIAGNOSIS — Z1283 Encounter for screening for malignant neoplasm of skin: Secondary | ICD-10-CM | POA: Diagnosis not present

## 2017-11-27 DIAGNOSIS — D485 Neoplasm of uncertain behavior of skin: Secondary | ICD-10-CM | POA: Diagnosis not present

## 2017-11-27 DIAGNOSIS — X32XXXD Exposure to sunlight, subsequent encounter: Secondary | ICD-10-CM | POA: Diagnosis not present

## 2017-11-27 DIAGNOSIS — Z08 Encounter for follow-up examination after completed treatment for malignant neoplasm: Secondary | ICD-10-CM | POA: Diagnosis not present

## 2017-11-27 DIAGNOSIS — L570 Actinic keratosis: Secondary | ICD-10-CM | POA: Diagnosis not present

## 2017-11-27 DIAGNOSIS — Z85820 Personal history of malignant melanoma of skin: Secondary | ICD-10-CM | POA: Diagnosis not present

## 2017-12-11 DIAGNOSIS — L97119 Non-pressure chronic ulcer of right thigh with unspecified severity: Secondary | ICD-10-CM | POA: Diagnosis not present

## 2017-12-11 DIAGNOSIS — D485 Neoplasm of uncertain behavior of skin: Secondary | ICD-10-CM | POA: Diagnosis not present

## 2018-01-13 ENCOUNTER — Encounter: Payer: Self-pay | Admitting: *Deleted

## 2018-01-20 ENCOUNTER — Encounter (HOSPITAL_BASED_OUTPATIENT_CLINIC_OR_DEPARTMENT_OTHER): Payer: Self-pay | Admitting: Emergency Medicine

## 2018-01-20 ENCOUNTER — Other Ambulatory Visit: Payer: Self-pay

## 2018-01-20 ENCOUNTER — Emergency Department (HOSPITAL_BASED_OUTPATIENT_CLINIC_OR_DEPARTMENT_OTHER)
Admission: EM | Admit: 2018-01-20 | Discharge: 2018-01-20 | Disposition: A | Discharge status: Home / Self Care | Payer: BLUE CROSS/BLUE SHIELD | Attending: Emergency Medicine | Admitting: Emergency Medicine

## 2018-01-20 ENCOUNTER — Emergency Department (HOSPITAL_BASED_OUTPATIENT_CLINIC_OR_DEPARTMENT_OTHER): Payer: BLUE CROSS/BLUE SHIELD

## 2018-01-20 DIAGNOSIS — Z7982 Long term (current) use of aspirin: Secondary | ICD-10-CM | POA: Insufficient documentation

## 2018-01-20 DIAGNOSIS — Z79899 Other long term (current) drug therapy: Secondary | ICD-10-CM | POA: Insufficient documentation

## 2018-01-20 DIAGNOSIS — N400 Enlarged prostate without lower urinary tract symptoms: Secondary | ICD-10-CM | POA: Diagnosis not present

## 2018-01-20 DIAGNOSIS — R1084 Generalized abdominal pain: Secondary | ICD-10-CM

## 2018-01-20 LAB — COMPREHENSIVE METABOLIC PANEL
ALBUMIN: 4.2 g/dL (ref 3.5–5.0)
ALT: 22 U/L (ref 17–63)
AST: 21 U/L (ref 15–41)
Alkaline Phosphatase: 109 U/L (ref 38–126)
Anion gap: 10 (ref 5–15)
BUN: 20 mg/dL (ref 6–20)
CHLORIDE: 103 mmol/L (ref 101–111)
CO2: 23 mmol/L (ref 22–32)
Calcium: 9.4 mg/dL (ref 8.9–10.3)
Creatinine, Ser: 0.76 mg/dL (ref 0.61–1.24)
GFR calc Af Amer: >60 mL/min (ref >60)
GFR calc non Af Amer: >60 mL/min (ref >60)
GLUCOSE: 156 mg/dL — AB (ref 65–99)
POTASSIUM: 3.7 mmol/L (ref 3.5–5.1)
Sodium: 136 mmol/L (ref 135–145)
TOTAL PROTEIN: 7.5 g/dL (ref 6.5–8.1)
Total Bilirubin: 1 mg/dL (ref 0.3–1.2)

## 2018-01-20 LAB — CBC WITH DIFFERENTIAL/PLATELET
Basophils Absolute: 0 10*3/uL (ref 0.0–0.1)
Basophils Relative: 0 %
EOS ABS: 0.1 10*3/uL (ref 0.0–0.7)
EOS PCT: 1 %
HCT: 46.1 % (ref 39.0–52.0)
Hemoglobin: 16.4 g/dL (ref 13.0–17.0)
LYMPHS PCT: 13 %
Lymphs Abs: 1.2 10*3/uL (ref 0.7–4.0)
MCH: 31.2 pg (ref 26.0–34.0)
MCHC: 35.6 g/dL (ref 30.0–36.0)
MCV: 87.8 fL (ref 78.0–100.0)
MONO ABS: 0.5 10*3/uL (ref 0.1–1.0)
Monocytes Relative: 6 %
Neutro Abs: 7.4 10*3/uL (ref 1.7–7.7)
Neutrophils Relative %: 80 %
PLATELETS: 159 10*3/uL (ref 150–400)
RBC: 5.25 MIL/uL (ref 4.22–5.81)
RDW: 13.2 % (ref 11.5–15.5)
WBC: 9.1 10*3/uL (ref 4.0–10.5)

## 2018-01-20 LAB — LIPASE, BLOOD: Lipase: 21 U/L (ref 11–51)

## 2018-01-20 LAB — URINALYSIS, ROUTINE W REFLEX MICROSCOPIC
Bilirubin Urine: NEGATIVE
GLUCOSE, UA: NEGATIVE mg/dL
Hgb urine dipstick: NEGATIVE
Ketones, ur: 15 mg/dL — AB
LEUKOCYTES UA: NEGATIVE
Nitrite: NEGATIVE
PH: 6 (ref 5.0–8.0)
Protein, ur: NEGATIVE mg/dL
Specific Gravity, Urine: >1.03 — ABNORMAL HIGH (ref 1.005–1.030)

## 2018-01-20 MED ORDER — MORPHINE SULFATE (PF) 4 MG/ML IV SOLN
4.0000 mg | Freq: Once | INTRAVENOUS | Status: AC
  Administered 2018-01-20: 4 mg via INTRAVENOUS
  Filled 2018-01-20: qty 1

## 2018-01-20 MED ORDER — DICYCLOMINE HCL 10 MG/ML IM SOLN
20.0000 mg | Freq: Once | INTRAMUSCULAR | Status: AC
  Administered 2018-01-20: 20 mg via INTRAMUSCULAR
  Filled 2018-01-20: qty 2

## 2018-01-20 MED ORDER — ONDANSETRON 4 MG PO TBDP: 4.0000 mg | ORAL_TABLET | Freq: Three times a day (TID) | ORAL | 0 refills | Status: DC | PRN

## 2018-01-20 MED ORDER — SODIUM CHLORIDE 0.9 % IV BOLUS
1000.0000 mL | Freq: Once | INTRAVENOUS | Status: AC
  Administered 2018-01-20: 1000 mL via INTRAVENOUS

## 2018-01-20 MED ORDER — ONDANSETRON HCL 4 MG/2ML IJ SOLN
4.0000 mg | Freq: Once | INTRAMUSCULAR | Status: AC
  Administered 2018-01-20: 4 mg via INTRAVENOUS
  Filled 2018-01-20: qty 2

## 2018-01-20 MED ORDER — DICYCLOMINE HCL 20 MG PO TABS: 20.0000 mg | ORAL_TABLET | Freq: Three times a day (TID) | ORAL | 0 refills | Status: DC | PRN

## 2018-01-20 MED ORDER — IOPAMIDOL (ISOVUE-300) INJECTION 61%
100.0000 mL | Freq: Once | INTRAVENOUS | Status: AC | PRN
  Administered 2018-01-20: 100 mL via INTRAVENOUS

## 2018-01-20 NOTE — Discharge Instructions (Addendum)
Your labs, urine, CT scan were normal today.  You may be coming down with a viral illness.  There was no sign of bowel obstruction, inflammation of your intestines.  Please drink plenty of water and eat a bland diet for the next several days.  He may alternate Tylenol and ibuprofen for pain.

## 2018-01-20 NOTE — ED Notes (Signed)
Pt still having some pain and very nauseated.

## 2018-01-20 NOTE — ED Triage Notes (Signed)
C/o abd pain that started about 1400 yesterday afternoon. Hx of intestinal blockage. Denies n/v/d. Reports last BM yesterday am.

## 2018-01-20 NOTE — ED Provider Notes (Signed)
TIME SEEN: 3:50 AM  CHIEF COMPLAINT: Abdominal pain  HPI: Patient is a 67 year old male with history of hiatal hernia status post repair, history of small bowel obstruction in 2009 and 2013, hypertension, hyperlipidemia who presents to the emergency department with generalized abdominal cramping that started yesterday.  States it feels similar to when he has had previous bowel obstruction.  Has had nausea but no vomiting.  Last bowel movement was yesterday morning without blood or melena.  No fever.  Reports he has been burping and has been able to pass gas but not as much as normal.  No sick contacts.  ROS: See HPI Constitutional: no fever  Eyes: no drainage  ENT: no runny nose   Cardiovascular:  no chest pain  Resp: no SOB  GI: no vomiting GU: no dysuria Integumentary: no rash  Allergy: no hives  Musculoskeletal: no leg swelling  Neurological: no slurred speech ROS otherwise negative  PAST MEDICAL HISTORY/PAST SURGICAL HISTORY:  Past Medical History:  Diagnosis Date  . Arthritis   . Atypical small acinar proliferation of prostate   . Benign paroxysmal positional vertigo   . Elevated PSA   . Essential hypertension, benign   . GERD (gastroesophageal reflux disease)   . History of basal cell carcinoma excision    few times  . History of hiatal hernia   . History of kidney stones   . History of melanoma excision    x1  localized  . History of small bowel obstruction    2009  &  2013--  both times resolved without surgical intervevtion  . Hyperlipidemia   . LAFB (left anterior fascicular block)   . Prediabetes     MEDICATIONS:  Prior to Admission medications   Medication Sig Start Date End Date Taking? Authorizing Provider  amLODipine (NORVASC) 5 MG tablet TAKE 1 & 1/2 TABLET IN THE MORNING 10/10/17   Chipper Herb, MD  aspirin 81 MG tablet Take 81 mg by mouth daily.    [provider]  Cholecalciferol (VITAMIN D) 2000 UNITS CAPS Take 1-2 capsules by mouth daily.  Takes 1 capsule daily= 2000 units; Except on Saturday and Sunday patient takes 2 capsules= 4000 units    [provider]  lisinopril (PRINIVIL,ZESTRIL) 20 MG tablet Take 1 tablet (20 mg total) by mouth daily. 10/10/17   Chipper Herb, MD  metoprolol succinate (TOPROL-XL) 100 MG 24 hr tablet TAKE (1) TABLET DAILY IN THE MORNING. 10/10/17   Chipper Herb, MD  rosuvastatin (CRESTOR) 40 MG tablet TAKE (1) TABLET DAILY AS DIRECTED. 10/10/17   Chipper Herb, MD    ALLERGIES:  Allergies  Allergen Reactions  . Penicillins Itching and Rash    SOCIAL HISTORY:  Social History   Tobacco Use  . Smoking status: Never Smoker  . Smokeless tobacco: Never Used  Substance Use Topics  . Alcohol use: Yes    Comment: rare    FAMILY HISTORY: Family History  Problem Relation Age of Onset  . Hypertension Mother   . Melanoma Mother   . Hypertension Father   . Diabetes Father     EXAM: BP (!) 166/94 (BP Location: Right Arm)   Pulse 69   Temp 97.6 F (36.4 C) (Oral)   Resp 19   Ht 6' (1.829 m)   Wt 105.2 kg (232 lb)   SpO2 96%   BMI 31.46 kg/m  CONSTITUTIONAL: Alert and oriented and responds appropriately to questions. Well-appearing; well-nourished HEAD: Normocephalic EYES: Conjunctivae clear, pupils appear  equal, EOMI ENT: normal nose; moist mucous membranes NECK: Supple, no meningismus, no nuchal rigidity, no LAD  CARD: RRR; S1 and S2 appreciated; no murmurs, no clicks, no rubs, no gallops RESP: Normal chest excursion without splinting or tachypnea; breath sounds clear and equal bilaterally; no wheezes, no rhonchi, no rales, no hypoxia or respiratory distress, speaking full sentences ABD/GI: Normal bowel sounds; non-distended; soft, mildly tender to palpation diffusely throughout the abdomen, no rebound, no guarding, no peritoneal signs, no hepatosplenomegaly BACK:  The back appears normal and is non-tender to palpation, there is no CVA tenderness EXT: Normal ROM in all joints;  non-tender to palpation; no edema; normal capillary refill; no cyanosis, no calf tenderness or swelling    SKIN: Normal color for age and race; warm; no rash NEURO: Moves all extremities equally PSYCH: The patient's mood and manner are appropriate. Grooming and personal hygiene are appropriate.  MEDICAL DECISION MAKING: Patient here with abdominal pain.  He is most concerned that he could have another bowel obstruction.  Patient has presented several times in the past with similar symptoms and at times has had enteritis, ileus but has had 2 previous bowel obstructions.  Will obtain labs, urine and a CT of his abdomen pelvis.  Doubt appendicitis, colitis, diverticulitis, cholecystitis, pancreatitis based on his benign exam.  ED PROGRESS: Patient's labs, urine and CT scan completely unremarkable.  No sign of bowel obstruction.  I feel he is safe to be discharged home.  Will discharge with Bentyl and Zofran for symptomatic relief.  Discussed return precautions.  Recommended bland diet.  Discussed with him that this could be the beginning of a viral illness causing his symptoms.  He has a PCP for follow-up.  I do not feel he needs admission at this time.   At this time, I do not feel there is any life-threatening condition present. I have reviewed and discussed all results (EKG, imaging, lab, urine as appropriate) and exam findings with patient/family. I have reviewed nursing notes and appropriate previous records.  I feel the patient is safe to be discharged home without further emergent workup and can continue workup as an outpatient as needed. Discussed usual and customary return precautions. Patient/family verbalize understanding and are comfortable with this plan.  Outpatient follow-up has been provided if needed. All questions have been answered.      Markeia Harkless, Delice Bison, DO 01/20/18 [phone]

## 2018-04-01 ENCOUNTER — Encounter: Payer: Self-pay | Admitting: Family Medicine

## 2018-04-01 ENCOUNTER — Ambulatory Visit (INDEPENDENT_AMBULATORY_CARE_PROVIDER_SITE_OTHER): Payer: BLUE CROSS/BLUE SHIELD

## 2018-04-01 ENCOUNTER — Ambulatory Visit: Payer: BLUE CROSS/BLUE SHIELD | Admitting: Family Medicine

## 2018-04-01 VITALS — BP 130/70 | HR 66 | Temp 98.0°F | Ht 72.0 in | Wt 240.0 lb

## 2018-04-01 DIAGNOSIS — I10 Essential (primary) hypertension: Secondary | ICD-10-CM | POA: Diagnosis not present

## 2018-04-01 DIAGNOSIS — R972 Elevated prostate specific antigen [PSA]: Secondary | ICD-10-CM | POA: Diagnosis not present

## 2018-04-01 DIAGNOSIS — Z6831 Body mass index (BMI) 31.0-31.9, adult: Secondary | ICD-10-CM | POA: Diagnosis not present

## 2018-04-01 DIAGNOSIS — E7800 Pure hypercholesterolemia, unspecified: Secondary | ICD-10-CM

## 2018-04-01 DIAGNOSIS — E559 Vitamin D deficiency, unspecified: Secondary | ICD-10-CM | POA: Diagnosis not present

## 2018-04-01 NOTE — Patient Instructions (Addendum)
Medicare Annual Wellness Visit  [facility] and the medical providers at [facility] strive to bring you the best medical care.  In doing so we not only want to address your current medical conditions and concerns but also to detect new conditions early and prevent illness, disease and health-related problems.    Medicare offers a yearly Wellness Visit which allows our clinical staff to assess your need for preventative services including immunizations, lifestyle education, counseling to decrease risk of preventable diseases and screening for fall risk and other medical concerns.    This visit is provided free of charge (no copay) for all Medicare recipients. The clinical pharmacists at [facility] have begun to conduct these Wellness Visits which will also include a thorough review of all your medications.    As you primary medical provider recommend that you make an appointment for your Annual Wellness Visit if you have not done so already this year.  You may set up this appointment before you leave today or you may call back ([phone]) and schedule an appointment.  Please make sure when you call that you mention that you are scheduling your Annual Wellness Visit with the clinical pharmacist so that the appointment may be made for the proper length of time.     Continue current medications. Continue good therapeutic lifestyle changes which include good diet and exercise. Fall precautions discussed with patient. If an FOBT was given today- please return it to our front desk. If you are over 5 years old - you may need Prevnar 89 or the adult Pneumonia vaccine.  **Flu shots are available--- please call and schedule a FLU-CLINIC appointment**  After your visit with [place] today you will receive a survey in the mail or online from [employer] regarding your care with [place]. Please take a moment to fill this out. Your feedback is very  important to [place] as you can help [place] better understand your patient needs as well as improve your experience and satisfaction. WE CARE ABOUT YOU!!!   Follow-up with urology as planned and come to our office prior to the next visit with the urologist and we will get your PSA here This summer drink plenty of water and fluids and stay well-hydrated Walk and exercise regularly and reduce the carbs in your diet to achieve weight loss.

## 2018-04-01 NOTE — Progress Notes (Signed)
Subjective:    Patient ID: [name], male    DOB: 11-08-1950, 67 y.o.   MRN: [id number]  HPI Pt here for follow up and management of chronic medical problems which includes hyperlipidemia. He is taking medication regularly.  The patient comes in for his regular visit which is every 6 months.  He will get lab work today because of his history of elevated cholesterol and family history of stroke.  He has atherosclerosis of the aorta.  He has hyperlipidemia and hypertension.  He follows up regularly with the urologist because of an elevated PSA and BPH.  His vital signs today are stable.  His BMI is 31.46.  His last colonoscopy was in 2018 in July.  Patient is currently taking amlodipine 5 mg lisinopril 20 metoprolol 100 and Crestor 40.  The patient is doing very well is very pleasant and calm.  He sees the urologist every 6 months because of his elevated PSA and the most recent was was down slightly lower than the previous one and this is good.  He goes back again sometime in November.  He sees Dr. Jeffie Pollock.  He denies any chest pain pressure palpitations shortness of breath.  He denies any trouble with swallowing heartburn indigestion nausea vomiting diarrhea blood in the stool black tarry bowel movements or change in bowel habits.  He is passing his water well.  He gets his eye exams done every year.   Patient Active Problem List   Diagnosis Date Noted  . Elevated PSA 03/19/2016  . Pre-diabetes 03/18/2014  . SBO (small bowel obstruction) ([place] [facility]) 03/22/2012  . Hypertension   . Hyperlipidemia   . Reflux   . Vertigo, intermittent   . Other testicular hypofunction    Outpatient Encounter Medications as of 04/01/2018  Medication Sig  . amLODipine (NORVASC) 5 MG tablet TAKE 1 & 1/2 TABLET IN THE MORNING  . aspirin 81 MG tablet Take 81 mg by mouth daily.  . Cholecalciferol (VITAMIN D) 2000 UNITS CAPS Take 1-2 capsules by mouth daily. Takes 1 capsule daily= 2000 units; Except on Saturday and Sunday  patient takes 2 capsules= 4000 units  . lisinopril (PRINIVIL,ZESTRIL) 20 MG tablet Take 1 tablet (20 mg total) by mouth daily.  . metoprolol succinate (TOPROL-XL) 100 MG 24 hr tablet TAKE (1) TABLET DAILY IN THE MORNING.  . rosuvastatin (CRESTOR) 40 MG tablet TAKE (1) TABLET DAILY AS DIRECTED.  . [DISCONTINUED] dicyclomine (BENTYL) 20 MG tablet Take 1 tablet (20 mg total) by mouth every 8 (eight) hours as needed for spasms (Abdominal cramping).  . [DISCONTINUED] ondansetron (ZOFRAN ODT) 4 MG disintegrating tablet Take 1 tablet (4 mg total) by mouth every 8 (eight) hours as needed for nausea or vomiting.   No facility-administered encounter medications on file as of 04/01/2018.      Review of Systems  Constitutional: Negative.   HENT: Negative.   Eyes: Negative.   Respiratory: Negative.   Cardiovascular: Negative.   Gastrointestinal: Negative.   Endocrine: Negative.   Genitourinary: Negative.   Musculoskeletal: Negative.   Skin: Negative.   Allergic/Immunologic: Negative.   Neurological: Negative.   Hematological: Negative.   Psychiatric/Behavioral: Negative.        Objective:   Physical Exam  Constitutional: He is oriented to person, place, and time. He appears well-developed and well-nourished. No distress.  The patient is pleasant and alert and in good spirits  HENT:  Head: Normocephalic and atraumatic.  Right Ear: External ear normal.  Left Ear: External ear normal.  Nose: Nose normal.  Mouth/Throat: Oropharynx is clear and moist. No oropharyngeal exudate.  Eyes: Pupils are equal, round, and reactive to light. Conjunctivae and EOM are normal. Right eye exhibits no discharge. Left eye exhibits no discharge. No scleral icterus.  Neck: Normal range of motion. Neck supple. No thyromegaly present.  Cardiovascular: Normal rate, regular rhythm, normal heart sounds and intact distal pulses.  No murmur heard. Heart is regular at 60/min  Pulmonary/Chest: Effort normal and breath  sounds normal. No respiratory distress. He has no wheezes. He has no rales. He exhibits no tenderness.  Clear anteriorly and posteriorly no axillary adenopathy or chest wall masses or tenderness  Abdominal: Soft. Bowel sounds are normal. He exhibits no mass. There is no tenderness. There is no rebound.  No liver or spleen enlargement no epigastric tenderness no bruits no inguinal adenopathy  Genitourinary:  Genitourinary Comments: This is done regularly by the urologist because of the history of his elevated PSA.  Musculoskeletal: Normal range of motion. He exhibits no edema.  Lymphadenopathy:    He has no cervical adenopathy.  Neurological: He is alert and oriented to person, place, and time. He has normal reflexes. No cranial nerve deficit.  Skin: Skin is warm and dry. No rash noted.  Psychiatric: He has a normal mood and affect. His behavior is normal. Judgment and thought content normal.  The patient has normal mood affect behavior and thought and judgment.  Nursing note and vitals reviewed.  BP 130/70 (BP Location: Left Arm)   Pulse 66   Temp 98 F (36.7 C) (Oral)   Ht 6' (1.829 m)   Wt 240 lb (108.9 kg)   BMI 32.55 kg/m   Chest x-ray with results pending===      Assessment & Plan:  1. Pure hypercholesterolemia -Continue with statin pending results of lab work along with aggressive therapeutic lifestyle changes including diet and exercise to achieve weight loss - CBC with Differential/Platelet - Lipid panel - DG Chest 2 View; Future  2. Hypertension -Blood pressure is good today and he will continue with current treatment - BMP8+EGFR - CBC with Differential/Platelet - Hepatic function panel - DG Chest 2 View; Future  3. Vitamin D deficiency -Continue with vitamin D replacement pending results of lab work - CBC with Differential/Platelet - VITAMIN D 25 Hydroxy (Vit-D Deficiency, Fractures)  4. Elevated PSA -Continue with urology follow-up with Dr. Jeffie Pollock and we can  do the PSA prior to your next visit to seeing him.  He should come in at least a week before that visit. - CBC with Differential/Platelet  5. BMI 31.0-31.9,adult -Continue aggressive therapeutic lifestyle changes with all efforts through diet and exercise to lose weight and lower BMI  Patient Instructions                       Medicare Annual Wellness Visit  [facility] and the medical providers at [facility] strive to bring you the best medical care.  In doing so we not only want to address your current medical conditions and concerns but also to detect new conditions early and prevent illness, disease and health-related problems.    Medicare offers a yearly Wellness Visit which allows our clinical staff to assess your need for preventative services including immunizations, lifestyle education, counseling to decrease risk of preventable diseases and screening for fall risk and other medical concerns.    This visit is provided free of charge (no copay) for all Medicare recipients.  The clinical pharmacists at [facility] have begun to conduct these Wellness Visits which will also include a thorough review of all your medications.    As you primary medical provider recommend that you make an appointment for your Annual Wellness Visit if you have not done so already this year.  You may set up this appointment before you leave today or you may call back ([phone]) and schedule an appointment.  Please make sure when you call that you mention that you are scheduling your Annual Wellness Visit with the clinical pharmacist so that the appointment may be made for the proper length of time.     Continue current medications. Continue good therapeutic lifestyle changes which include good diet and exercise. Fall precautions discussed with patient. If an FOBT was given today- please return it to our front desk. If you are over 45 years old - you may need Prevnar  81 or the adult Pneumonia vaccine.  **Flu shots are available--- please call and schedule a FLU-CLINIC appointment**  After your visit with [place] today you will receive a survey in the mail or online from [employer] regarding your care with [place]. Please take a moment to fill this out. Your feedback is very important to [place] as you can help [place] better understand your patient needs as well as improve your experience and satisfaction. WE CARE ABOUT YOU!!!   Follow-up with urology as planned and come to our office prior to the next visit with the urologist and we will get your PSA here This summer drink plenty of water and fluids and stay well-hydrated Walk and exercise regularly and reduce the carbs in your diet to achieve weight loss.  Arrie Senate MD

## 2018-04-02 LAB — HEPATIC FUNCTION PANEL
ALT: 19 IU/L (ref 0–44)
AST: 15 IU/L (ref 0–40)
Albumin: 4.2 g/dL (ref 3.6–4.8)
Alkaline Phosphatase: 128 IU/L — ABNORMAL HIGH (ref 39–117)
BILIRUBIN, DIRECT: 0.15 mg/dL (ref 0.00–0.40)
Bilirubin Total: 0.5 mg/dL (ref 0.0–1.2)
Total Protein: 6.9 g/dL (ref 6.0–8.5)

## 2018-04-02 LAB — CBC WITH DIFFERENTIAL/PLATELET
BASOS: 1 %
Basophils Absolute: 0 10*3/uL (ref 0.0–0.2)
EOS (ABSOLUTE): 0.1 10*3/uL (ref 0.0–0.4)
EOS: 2 %
HEMATOCRIT: 43.8 % (ref 37.5–51.0)
HEMOGLOBIN: 14.7 g/dL (ref 13.0–17.7)
IMMATURE GRANS (ABS): 0 10*3/uL (ref 0.0–0.1)
IMMATURE GRANULOCYTES: 0 %
LYMPHS: 29 %
Lymphocytes Absolute: 1.7 10*3/uL (ref 0.7–3.1)
MCH: 30.4 pg (ref 26.6–33.0)
MCHC: 33.6 g/dL (ref 31.5–35.7)
MCV: 91 fL (ref 79–97)
MONOCYTES: 8 %
MONOS ABS: 0.5 10*3/uL (ref 0.1–0.9)
Neutrophils Absolute: 3.6 10*3/uL (ref 1.4–7.0)
Neutrophils: 60 %
Platelets: 174 10*3/uL (ref 150–450)
RBC: 4.83 x10E6/uL (ref 4.14–5.80)
RDW: 14.1 % (ref 12.3–15.4)
WBC: 5.9 10*3/uL (ref 3.4–10.8)

## 2018-04-02 LAB — BMP8+EGFR
BUN/Creatinine Ratio: 29 — ABNORMAL HIGH (ref 10–24)
BUN: 26 mg/dL (ref 8–27)
CO2: 26 mmol/L (ref 20–29)
CREATININE: 0.9 mg/dL (ref 0.76–1.27)
Calcium: 9.2 mg/dL (ref 8.6–10.2)
Chloride: 104 mmol/L (ref 96–106)
GFR calc Af Amer: 102 mL/min/{1.73_m2} (ref >59)
GFR, EST NON AFRICAN AMERICAN: 88 mL/min/{1.73_m2} (ref >59)
Glucose: 131 mg/dL — ABNORMAL HIGH (ref 65–99)
Potassium: 4.3 mmol/L (ref 3.5–5.2)
SODIUM: 144 mmol/L (ref 134–144)

## 2018-04-02 LAB — VITAMIN D 25 HYDROXY (VIT D DEFICIENCY, FRACTURES): Vit D, 25-Hydroxy: 49.3 ng/mL (ref 30.0–100.0)

## 2018-04-02 LAB — LIPID PANEL
CHOL/HDL RATIO: 2.4 ratio (ref 0.0–5.0)
Cholesterol, Total: 100 mg/dL (ref 100–199)
HDL: 42 mg/dL (ref >39)
LDL CALC: 40 mg/dL (ref 0–99)
Triglycerides: 91 mg/dL (ref 0–149)
VLDL CHOLESTEROL CAL: 18 mg/dL (ref 5–40)

## 2018-04-09 ENCOUNTER — Ambulatory Visit: Payer: BLUE CROSS/BLUE SHIELD | Admitting: Family Medicine

## 2018-06-14 ENCOUNTER — Encounter (HOSPITAL_BASED_OUTPATIENT_CLINIC_OR_DEPARTMENT_OTHER): Payer: Self-pay | Admitting: *Deleted

## 2018-06-14 ENCOUNTER — Other Ambulatory Visit: Payer: Self-pay

## 2018-06-14 ENCOUNTER — Inpatient Hospital Stay (HOSPITAL_BASED_OUTPATIENT_CLINIC_OR_DEPARTMENT_OTHER)
Admission: EM | Admit: 2018-06-14 | Discharge: 2018-06-15 | DRG: 390 | Disposition: A | Discharge status: Home / Self Care | Payer: Medicare Other | Attending: Internal Medicine | Admitting: Internal Medicine

## 2018-06-14 ENCOUNTER — Emergency Department (HOSPITAL_BASED_OUTPATIENT_CLINIC_OR_DEPARTMENT_OTHER): Payer: Medicare Other

## 2018-06-14 DIAGNOSIS — K5669 Other partial intestinal obstruction: Secondary | ICD-10-CM | POA: Diagnosis not present

## 2018-06-14 DIAGNOSIS — K56609 Unspecified intestinal obstruction, unspecified as to partial versus complete obstruction: Secondary | ICD-10-CM

## 2018-06-14 DIAGNOSIS — K56600 Partial intestinal obstruction, unspecified as to cause: Secondary | ICD-10-CM

## 2018-06-14 DIAGNOSIS — K567 Ileus, unspecified: Secondary | ICD-10-CM | POA: Diagnosis present

## 2018-06-14 DIAGNOSIS — Z880 Allergy status to penicillin: Secondary | ICD-10-CM

## 2018-06-14 DIAGNOSIS — E869 Volume depletion, unspecified: Secondary | ICD-10-CM | POA: Diagnosis present

## 2018-06-14 DIAGNOSIS — Z8249 Family history of ischemic heart disease and other diseases of the circulatory system: Secondary | ICD-10-CM

## 2018-06-14 DIAGNOSIS — Z808 Family history of malignant neoplasm of other organs or systems: Secondary | ICD-10-CM

## 2018-06-14 DIAGNOSIS — E785 Hyperlipidemia, unspecified: Secondary | ICD-10-CM | POA: Diagnosis present

## 2018-06-14 DIAGNOSIS — Z7982 Long term (current) use of aspirin: Secondary | ICD-10-CM

## 2018-06-14 DIAGNOSIS — M1990 Unspecified osteoarthritis, unspecified site: Secondary | ICD-10-CM | POA: Diagnosis present

## 2018-06-14 DIAGNOSIS — Z85820 Personal history of malignant melanoma of skin: Secondary | ICD-10-CM | POA: Diagnosis not present

## 2018-06-14 DIAGNOSIS — N201 Calculus of ureter: Secondary | ICD-10-CM | POA: Diagnosis not present

## 2018-06-14 DIAGNOSIS — E669 Obesity, unspecified: Secondary | ICD-10-CM | POA: Diagnosis present

## 2018-06-14 DIAGNOSIS — Z6831 Body mass index (BMI) 31.0-31.9, adult: Secondary | ICD-10-CM | POA: Diagnosis not present

## 2018-06-14 DIAGNOSIS — I10 Essential (primary) hypertension: Secondary | ICD-10-CM | POA: Diagnosis present

## 2018-06-14 DIAGNOSIS — N200 Calculus of kidney: Secondary | ICD-10-CM | POA: Diagnosis present

## 2018-06-14 DIAGNOSIS — Z87442 Personal history of urinary calculi: Secondary | ICD-10-CM | POA: Diagnosis not present

## 2018-06-14 DIAGNOSIS — R7303 Prediabetes: Secondary | ICD-10-CM | POA: Diagnosis present

## 2018-06-14 DIAGNOSIS — I700 Atherosclerosis of aorta: Secondary | ICD-10-CM | POA: Diagnosis present

## 2018-06-14 DIAGNOSIS — K219 Gastro-esophageal reflux disease without esophagitis: Secondary | ICD-10-CM | POA: Diagnosis present

## 2018-06-14 DIAGNOSIS — Z79899 Other long term (current) drug therapy: Secondary | ICD-10-CM

## 2018-06-14 DIAGNOSIS — Z833 Family history of diabetes mellitus: Secondary | ICD-10-CM

## 2018-06-14 LAB — CBC WITH DIFFERENTIAL/PLATELET
BASOS PCT: 0 %
Basophils Absolute: 0 10*3/uL (ref 0.0–0.1)
Eosinophils Absolute: 0 10*3/uL (ref 0.0–0.7)
Eosinophils Relative: 0 %
HCT: 46.7 % (ref 39.0–52.0)
HEMOGLOBIN: 16.5 g/dL (ref 13.0–17.0)
Lymphocytes Relative: 6 %
Lymphs Abs: 0.7 10*3/uL (ref 0.7–4.0)
MCH: 31 pg (ref 26.0–34.0)
MCHC: 35.3 g/dL (ref 30.0–36.0)
MCV: 87.6 fL (ref 78.0–100.0)
MONOS PCT: 5 %
Monocytes Absolute: 0.6 10*3/uL (ref 0.1–1.0)
NEUTROS PCT: 89 %
Neutro Abs: 10.5 10*3/uL — ABNORMAL HIGH (ref 1.7–7.7)
Platelets: 165 10*3/uL (ref 150–400)
RBC: 5.33 MIL/uL (ref 4.22–5.81)
RDW: 13.2 % (ref 11.5–15.5)
WBC: 11.8 10*3/uL — AB (ref 4.0–10.5)

## 2018-06-14 LAB — COMPREHENSIVE METABOLIC PANEL
ALBUMIN: 4.3 g/dL (ref 3.5–5.0)
ALT: 26 U/L (ref 0–44)
AST: 21 U/L (ref 15–41)
Alkaline Phosphatase: 104 U/L (ref 38–126)
Anion gap: 9 (ref 5–15)
BILIRUBIN TOTAL: 0.7 mg/dL (ref 0.3–1.2)
BUN: 19 mg/dL (ref 8–23)
CHLORIDE: 104 mmol/L (ref 98–111)
CO2: 26 mmol/L (ref 22–32)
Calcium: 9.2 mg/dL (ref 8.9–10.3)
Creatinine, Ser: 0.66 mg/dL (ref 0.61–1.24)
GFR calc Af Amer: >60 mL/min (ref >60)
GFR calc non Af Amer: >60 mL/min (ref >60)
Glucose, Bld: 168 mg/dL — ABNORMAL HIGH (ref 70–99)
POTASSIUM: 3.6 mmol/L (ref 3.5–5.1)
SODIUM: 139 mmol/L (ref 135–145)
Total Protein: 7.6 g/dL (ref 6.5–8.1)

## 2018-06-14 LAB — LIPASE, BLOOD: LIPASE: 22 U/L (ref 11–51)

## 2018-06-14 MED ORDER — MORPHINE SULFATE (PF) 2 MG/ML IV SOLN
2.0000 mg | INTRAVENOUS | Status: DC | PRN
  Administered 2018-06-14: 2 mg via INTRAVENOUS
  Filled 2018-06-14: qty 1

## 2018-06-14 MED ORDER — ONDANSETRON HCL 4 MG/2ML IJ SOLN: 4.0000 mg | Freq: Four times a day (QID) | INTRAMUSCULAR | Status: DC | PRN

## 2018-06-14 MED ORDER — ENOXAPARIN SODIUM 60 MG/0.6ML ~~LOC~~ SOLN
50.0000 mg | SUBCUTANEOUS | Status: DC
  Administered 2018-06-14: 50 mg via SUBCUTANEOUS
  Filled 2018-06-14: qty 0.6

## 2018-06-14 MED ORDER — INFLUENZA VAC SPLIT HIGH-DOSE 0.5 ML IM SUSY
0.5000 mL | PREFILLED_SYRINGE | INTRAMUSCULAR | Status: DC
  Filled 2018-06-14: qty 0.5

## 2018-06-14 MED ORDER — SODIUM CHLORIDE 0.9 % IV SOLN
INTRAVENOUS | Status: DC
  Administered 2018-06-14 – 2018-06-15 (×2): via INTRAVENOUS

## 2018-06-14 MED ORDER — SODIUM CHLORIDE 0.9 % IV BOLUS
500.0000 mL | Freq: Once | INTRAVENOUS | Status: AC
  Administered 2018-06-14: 500 mL via INTRAVENOUS

## 2018-06-14 MED ORDER — SODIUM CHLORIDE 0.9 % IV SOLN: INTRAVENOUS | Status: DC

## 2018-06-14 MED ORDER — IOPAMIDOL (ISOVUE-300) INJECTION 61%
100.0000 mL | Freq: Once | INTRAVENOUS | Status: AC | PRN
  Administered 2018-06-14: 100 mL via INTRAVENOUS

## 2018-06-14 MED ORDER — HYDROMORPHONE HCL 1 MG/ML IJ SOLN: 1.0000 mg | Freq: Once | INTRAMUSCULAR | Status: DC

## 2018-06-14 MED ORDER — VITAMIN D3 25 MCG (1000 UNIT) PO TABS
2000.0000 [IU] | ORAL_TABLET | Freq: Every day | ORAL | Status: DC
  Administered 2018-06-15: 2000 [IU] via ORAL
  Filled 2018-06-14 (×2): qty 2

## 2018-06-14 MED ORDER — ONDANSETRON HCL 4 MG/2ML IJ SOLN
4.0000 mg | Freq: Once | INTRAMUSCULAR | Status: AC
  Administered 2018-06-14: 4 mg via INTRAVENOUS
  Filled 2018-06-14: qty 2

## 2018-06-14 MED ORDER — HYDROMORPHONE HCL 1 MG/ML IJ SOLN
0.5000 mg | Freq: Once | INTRAMUSCULAR | Status: AC
  Administered 2018-06-14: 0.5 mg via INTRAVENOUS
  Filled 2018-06-14: qty 1

## 2018-06-14 MED ORDER — HYDRALAZINE HCL 20 MG/ML IJ SOLN: 10.0000 mg | Freq: Four times a day (QID) | INTRAMUSCULAR | Status: DC | PRN

## 2018-06-14 NOTE — Consult Note (Signed)
Re:   [name] DOB:   03-17-1951 MRN:   [id number]  Chief Complaint Abdominal pain  ASSESEMENT AND PLAN: 1.  SBO vs ileus  He has already had several BM's.  I expect this is resolving and agree with the current plans.  Clear liquids and KUB in AM.  Will follow.  2.  Right kidney stone (I printed him a copy of his CT scan)  Followed by [name] 3.  Probable right renal cyst  History of elevated PSA 4.  Atherosclerosis 5.  HTN 6.  History of HH repair in the 55's  Chief Complaint  Patient presents with  . Abdominal Pain   PHYSICIAN REQUESTING CONSULTATION:  Dr. Gloris Manchester, [facility]  HISTORY OF PRESENT ILLNESS: [name] is a 67 y.o. (DOB: 12-14-1950)  white male whose primary care physician is Devin Herb, MD. Wife, [name], at bedside.   The patient had a Allison repair in the 1970's.  His main complaint is that he cannot vomit.  It is his only adominal operation.  He sees Dr. Watt Climes who does his colonoscopies.  He has had a partial SBO before in 2000, 2013, and 01/20/2018 - that resolved without surgery  He has a history of eating popcorn Friday night, 9/13.  Eating popcorn has precipitated every "attack".  He's actually had 5 BM's by the time I saw him.  CT scan - 06/14/2018 - 1) Several fluid-filled mildly dilated small bowel loops over the lower abdomen without clear transition point. Air throughout the colon as findings may be due to ileus versus early/partial small bowel obstructive process.  2) Chronic finding of 7 mm nonobstructing stone over the distal right ureter just above the UVJ.  3) 1 cm left renal cortical hypodensity too small to characterize but likely a cyst.  4) Aortic Atherosclerosis    Past Medical History:  Diagnosis Date  . Arthritis   . Atypical small acinar proliferation of prostate   . Benign paroxysmal positional vertigo   . Elevated PSA   . Essential hypertension, benign   . GERD (gastroesophageal reflux disease)   .  History of basal cell carcinoma excision    few times  . History of hiatal hernia   . History of kidney stones   . History of melanoma excision    x1  localized  . History of small bowel obstruction    2009  &  2013--  both times resolved without surgical intervevtion  . Hyperlipidemia   . LAFB (left anterior fascicular block)   . Prediabetes       Past Surgical History:  Procedure Laterality Date  . GASTRIC FUNDOPLICATION  2951'O  . LUMBAR DISC SURGERY  x3   last one 1980's  . PROSTATE BIOPSY N/A 11/21/2015   Procedure: PROSTATE BIOPSY AND ULTRASOUND;  Surgeon: Irine Seal, MD;  Location[facility];  Service: Urology;  Laterality: N/A;      Current Facility-Administered Medications  Medication Dose Route Frequency Provider Last Rate Last Dose  . 0.9 %  sodium chloride infusion   Intravenous Continuous Samtani, Jai-Gurmukh, MD      . 0.9 %  sodium chloride infusion   Intravenous Continuous Samtani, Jai-Gurmukh, MD      . enoxaparin (LOVENOX) injection 50 mg  50 mg Subcutaneous Q24H Samtani, Jai-Gurmukh, MD      . hydrALAZINE (APRESOLINE) injection 10 mg  10 mg Intravenous Q6H PRN Nita Sells, MD      . Derrill Memo  ON 06/15/2018] Influenza vac split quadrivalent PF (FLUZONE HIGH-DOSE) injection 0.5 mL  0.5 mL Intramuscular Tomorrow-1000 Nita Sells, MD      . Vitamin D CAPS 2,000-4,000 Units  1-2 capsule Oral Daily Nita Sells, MD          Allergies  Allergen Reactions  . Penicillins Itching and Rash    REVIEW OF SYSTEMS: Skin:  No history of rash.  No history of abnormal moles. Infection:  No history of hepatitis or HIV.  No history of MRSA. Neurologic:  No history of stroke.  No history of seizure.  No history of headaches. Cardiac:  HTN.  Atherosclerosis on CT scan.  No history of heart disease.  No history of seeing a cardiologist. Pulmonary:  Does not smoke cigarettes.  No asthma or bronchitis.  No OSA/CPAP.  Endocrine:  He said  that he has been told he is pre diabetic. No thyroid disease. Gastrointestinal:  See HPI Urologic:  Right kidney stone. Followed by [name]. Probable right renal cyst Musculoskeletal:  No history of joint or back disease. Hematologic:  No bleeding disorder.  No history of anemia.  Not anticoagulated. Psycho-social:  The patient is oriented.   The patient has no obvious psychologic or social impairment to understanding our conversation and plan.  SOCIAL and FAMILY HISTORY: Married.  Wife [name] at bedside.  PHYSICAL EXAM: BP (!) 162/80 (BP Location: Right Arm)   Pulse 65   Temp 98 F (36.7 C) (Oral)   Resp 18   Ht 6' (1.829 m)   Wt 104.3 kg   SpO2 95%   BMI 31.19 kg/m   General: WN mildly obese WM who is alert and generally healthy appearing.  Skin:  Inspection and palpation - no mass or rash. Eyes:  Conjunctiva and lids unremarkable.            Pupils are equal Ears, Nose, Mouth, and Throat:  Ears and nose unremarkable            Lips and teeth are unremarable. Neck: Supple. No mass, trachea midline.  No thyroid mass. Lymph Nodes:  No supraclavicular, cervical, or inguinal nodes. Lungs: Normal respiratory effort.  Clear to auscultation and symmetric breath sounds. Heart:  Palpation of the heart is normal.            Auscultation: RRR. No murmur or rub.  Abdomen: Soft. No mass. No hernia.             Normal bowel sounds.  Upper midline scar.  He is only mildly sore right now. Rectal: Not done. Musculoskeletal:  Good muscle strength and ROM  in upper and lower extremities.  Neurologic:  Grossly intact to motor and sensory function. Psychiatric: Normal judgement and insight. Behavior is normal.            Oriented to time, person, place.   DATA REVIEWED, COUNSELING AND COORDINATION OF CARE: Epic notes reviewed. Counseling and coordination of care exceeded more than 50% of the time spent with patient. Total time spent with patient and charting: 40 minutes  Alphonsa Overall,  MD,  [facility], [address]  [place], [place]    [place] Phone:  [phone] FAX:  [phone]

## 2018-06-14 NOTE — H&P (Signed)
HPI  [name] WNU:[id number] DOB: [birth year] DOA: 06/14/2018  PCP: Chipper Herb, MD   Chief Complaint: Abdominal pain nausea  HPI:  67 year old male with hyperlipidemia HTN elevated PSA BMI 31  prediabetes fundoplication 2/2 hiatal hernia 1970s colonoscopy 09/2002 Dr. Watt Climes internal and external hemorrhoidsmelanoma Prior history 2000 05/2012 history of SBO and last visited ED for similar complaints 01/20/2018  Popcorn precipitates his attacks-states every time he has had an SBO this is occurred-has never had surgery-tells me had popcorn on Friday at a movie and then started developing discomfort-past 1 stool that was solid this morning and then had watery stool the rest of the day and severe abdominal pain-he says he feels nauseous but he cannot throw up-2/2 Nissen  He is passing some gas at this time-he does not have a fever, chills, blurred vision fainting spells weakness unilaterally or other findings   ED Course: Scan obtained as below-started on IV fluid given Dilaudid for pain with pain going from 9/10-->2/10  Review of Systems:  Negative except as above  Past Medical History:  Diagnosis Date  . Arthritis   . Atypical small acinar proliferation of prostate   . Benign paroxysmal positional vertigo   . Elevated PSA   . Essential hypertension, benign   . GERD (gastroesophageal reflux disease)   . History of basal cell carcinoma excision    few times  . History of hiatal hernia   . History of kidney stones   . History of melanoma excision    x1  localized  . History of small bowel obstruction    2009  &  2013--  both times resolved without surgical intervevtion  . Hyperlipidemia   . LAFB (left anterior fascicular block)   . Prediabetes     Past Surgical History:  Procedure Laterality Date  . GASTRIC FUNDOPLICATION  0347'Q  . LUMBAR DISC SURGERY  x3   last one 1980's  . PROSTATE BIOPSY N/A 11/21/2015   Procedure: PROSTATE BIOPSY AND ULTRASOUND;  Surgeon: Irine Seal, MD;  Location: [facility];  Service: Urology;  Laterality: N/A;     reports that he has never smoked. He has never used smokeless tobacco. He reports that he drinks alcohol. He reports that he does not use drugs. Mobility: Mobile independently Works at [employer] in [place] Married Non-smoker nondrinker  Allergies  Allergen Reactions  . Penicillins Itching and Rash    Family History  Problem Relation Age of Onset  . Hypertension Mother   . Melanoma Mother   . Hypertension Father   . Diabetes Father      Prior to Admission medications   Medication Sig Start Date End Date Taking? Authorizing Provider  amLODipine (NORVASC) 5 MG tablet TAKE 1 & 1/2 TABLET IN THE MORNING 10/10/17   Chipper Herb, MD  aspirin 81 MG tablet Take 81 mg by mouth daily.    [provider]  Cholecalciferol (VITAMIN D) 2000 UNITS CAPS Take 1-2 capsules by mouth daily. Takes 1 capsule daily= 2000 units; Except on Saturday and Sunday patient takes 2 capsules= 4000 units    [provider]  lisinopril (PRINIVIL,ZESTRIL) 20 MG tablet Take 1 tablet (20 mg total) by mouth daily. 10/10/17   Chipper Herb, MD  metoprolol succinate (TOPROL-XL) 100 MG 24 hr tablet TAKE (1) TABLET DAILY IN THE MORNING. 10/10/17   Chipper Herb, MD  rosuvastatin (CRESTOR) 40 MG tablet TAKE (1) TABLET DAILY AS DIRECTED. Patient taking  differently: Take 20 mg by mouth every other day. TAKE (1) TABLET DAILY AS DIRECTED. 10/10/17   Chipper Herb, MD    Physical Exam:  Vitals:   06/14/18 1112 06/14/18 1420  BP: (!) 164/92 (!) 153/79  Pulse: 74 66  Resp: 18 18  Temp: 97.9 F (36.6 C)   SpO2: 98% 95%     Awake pleasant thick neck Mallampati 1  No icterus no pallor  Chest clinically clear  Abdomen obese slightly tympanitic pain in epigastrium and right upper quadrant without rebound  Neurologically intact smile symmetric  Euthymic  I have personally  reviewed following labs and imaging studies  Labs:   WBC 11.8, hemoglobin 16.5, glucose 168  BUN/creatinine 19/0.66 alk phos 104 rest of LFTs normal 15 ketones  Specific gravity >1.030  Imaging studies:  CT abdomen pelvis 9/15 Several fluid-filled mildly dilated small bowel loops over the lower abdomen without clear transition point. Air throughout the colon as findings may be due to ileus versus early/partial small bowel obstructive process.  Chronic finding of 7 mm nonobstructing stone over the distal right ureter just above the UVJ.  1 cm left renal cortical hypodensity too small to characterize but likely a cyst.  Aortic Atherosclerosis (ICD10-I70.0).    Medical tests:   EKG independently reviewed: n    Test discussed with performing physician:  n   Decision to obtain old records:   n   Review and summation of old records:   n   Active Problems:   * No active hospital problems. *   Assessment/Plan P SBO?  Ileus-CT scan does not show any clear demarcating small bowel obstruction-nevertheless we will observe him overnight and get a x-ray in the morning I do think that if he passes normal stools does not have nausea and does not have any further GI upset he may be able to go home as this self resolves typically as it has over the past couple of years I am not sure if he actually has episodes of small bowel obstruction and in this case it looks more like an ileus-I have advised him against eating popcorn  Hypertension holding all antihypertensives at this time-placed on hydralazine  Mild leukocytosis-unclear if he has an infectious etiology-he has not had any further stool so I will hold antibiotics for now-if he has a further elevation of leukocytosis I would probably get a lactic acid to rule out ischemic gut as he has some mild tenderness but his CT was with contrast and would have shown some concerns if that was the case   moderate volume depletion-he  has ketones in his urine and specific gravity is up so we will keep him on saline overnight and I would keep him n.p.o.  Hyperlipidemia-hold statin for now  Elevated PSA-needs outpatient follow-up  Holding aspirin for now  Severity of Illness: The appropriate patient status for this patient is INPATIENT. Inpatient status is judged to be reasonable and necessary in order to provide the required intensity of service to ensure the patient's safety. The patient's presenting symptoms, physical exam findings, and initial radiographic and laboratory data in the context of their chronic comorbidities is felt to place them at high risk for further clinical deterioration. Furthermore, it is not anticipated that the patient will be medically stable for discharge from the hospital within 2 midnights of admission. The following factors support the patient status of inpatient.   " The patient's presenting symptoms include and discomfort and belly. " The worrisome  physical exam findings include abdominal discomfort and pain relieved by IV meds. " The initial radiographic and laboratory data are worrisome because of SBO. " The chronic co-morbidities include prostate cancer.   * I certify that at the point of admission it is my clinical judgment that the patient will require inpatient hospital care spanning beyond 2 midnights from the point of admission due to high intensity of service, high risk for further deterioration and high frequency of surveillance required.*  Lovenox, inpatient, expect can be discharged a.m., discussed with wife  Time spent: 33 minutes minutes  Verlon Au, MD  [facility] Direct contact: [phone] --[place] [place]  --[email or web address]; password [email or web address]  7PM-7AM contact night coverage as above  06/14/2018, 3:07 PM

## 2018-06-14 NOTE — ED Provider Notes (Addendum)
[facility] EMERGENCY DEPARTMENT Provider Note   CSN: [id number] Arrival date & time: 06/14/18  1100     History   Chief Complaint Chief Complaint  Patient presents with  . Abdominal Pain    HPI [name] is a 67 y.o. male.  Patient status post Nissen fundal fundoplication back in the 1970s.  Patient's had several bouts and concerns with partial or complete bowel obstruction.  But is never had to go to surgery is always cleared.  Last admission for this was in June 2013.  Patient also evaluated in August 2018 for abdominal pain and also in April 2019.  Patient with onset of abdominal pain with nausea states he is unable to vomit.  It started last evening.  Patient's felt as if he is been a little constipated for a few days.  Patient had a lot of discomfort overnight.  This morning did have several bowel movements did feel a little bit better.  But then the crampy abdominal pain came back.  Patient felt a little bit better but then came back again.  Patient's primary care doctor is [facility] in the [place] area.     Past Medical History:  Diagnosis Date  . Arthritis   . Atypical small acinar proliferation of prostate   . Benign paroxysmal positional vertigo   . Elevated PSA   . Essential hypertension, benign   . GERD (gastroesophageal reflux disease)   . History of basal cell carcinoma excision    few times  . History of hiatal hernia   . History of kidney stones   . History of melanoma excision    x1  localized  . History of small bowel obstruction    2009  &  2013--  both times resolved without surgical intervevtion  . Hyperlipidemia   . LAFB (left anterior fascicular block)   . Prediabetes     Patient Active Problem List   Diagnosis Date Noted  . BMI 31.0-31.9,adult 04/01/2018  . Vitamin D deficiency 04/01/2018  . Elevated PSA 03/19/2016  . Pre-diabetes 03/18/2014  . SBO (small bowel obstruction) ([place]) 03/22/2012  . Hypertension   .  Hyperlipidemia   . Reflux   . Vertigo, intermittent   . Other testicular hypofunction     Past Surgical History:  Procedure Laterality Date  . GASTRIC FUNDOPLICATION  2706'C  . LUMBAR DISC SURGERY  x3   last one 1980's  . PROSTATE BIOPSY N/A 11/21/2015   Procedure: PROSTATE BIOPSY AND ULTRASOUND;  Surgeon: Irine Seal, MD;  [facility];  Service: Urology;  Laterality: N/A;        Home Medications    Prior to Admission medications   Medication Sig Start Date End Date Taking? Authorizing Provider  amLODipine (NORVASC) 5 MG tablet TAKE 1 & 1/2 TABLET IN THE MORNING 10/10/17   Chipper Herb, MD  aspirin 81 MG tablet Take 81 mg by mouth daily.    [provider]  Cholecalciferol (VITAMIN D) 2000 UNITS CAPS Take 1-2 capsules by mouth daily. Takes 1 capsule daily= 2000 units; Except on Saturday and Sunday patient takes 2 capsules= 4000 units    [provider]  lisinopril (PRINIVIL,ZESTRIL) 20 MG tablet Take 1 tablet (20 mg total) by mouth daily. 10/10/17   Chipper Herb, MD  metoprolol succinate (TOPROL-XL) 100 MG 24 hr tablet TAKE (1) TABLET DAILY IN THE MORNING. 10/10/17   Chipper Herb, MD  rosuvastatin (CRESTOR) 40 MG tablet TAKE (1)  TABLET DAILY AS DIRECTED. Patient taking differently: Take 20 mg by mouth every other day. TAKE (1) TABLET DAILY AS DIRECTED. 10/10/17   Chipper Herb, MD    Family History Family History  Problem Relation Age of Onset  . Hypertension Mother   . Melanoma Mother   . Hypertension Father   . Diabetes Father     Social History Social History   Tobacco Use  . Smoking status: Never Smoker  . Smokeless tobacco: Never Used  Substance Use Topics  . Alcohol use: Yes    Comment: rare  . Drug use: No     Allergies   Penicillins   Review of Systems Review of Systems  Constitutional: Negative for fever.  HENT: Negative for congestion.   Eyes: Negative for visual disturbance.  Respiratory: Negative  for shortness of breath.   Cardiovascular: Negative for chest pain.  Gastrointestinal: Positive for abdominal pain and nausea.  Genitourinary: Negative for dysuria.  Musculoskeletal: Negative for myalgias.  Skin: Negative for rash.  Neurological: Negative for headaches.  Hematological: Does not bruise/bleed easily.  Psychiatric/Behavioral: Negative for confusion.     Physical Exam Updated Vital Signs BP (!) 153/79 (BP Location: Right Arm)   Pulse 66   Temp 97.9 F (36.6 C) (Oral)   Resp 18   Ht 1.829 m (6')   Wt 104.3 kg   SpO2 95%   BMI 31.19 kg/m   Physical Exam  Constitutional: He is oriented to person, place, and time. He appears well-developed and well-nourished. No distress.  HENT:  Head: Normocephalic and atraumatic.  Mouth/Throat: Oropharynx is clear and moist.  Eyes: Pupils are equal, round, and reactive to light. Conjunctivae and EOM are normal.  Neck: Neck supple.  Cardiovascular: Normal rate, regular rhythm and normal heart sounds.  Pulmonary/Chest: Effort normal and breath sounds normal.  Abdominal: Soft. Bowel sounds are normal. He exhibits no distension and no mass. There is no tenderness.  Musculoskeletal: Normal range of motion. He exhibits no edema.  Neurological: He is alert and oriented to person, place, and time. No cranial nerve deficit or sensory deficit. He exhibits normal muscle tone. Coordination normal.  Skin: Skin is warm. No rash noted.  Nursing note and vitals reviewed.    ED Treatments / Results  Labs (all labs ordered are listed, but only abnormal results are displayed) Labs Reviewed  COMPREHENSIVE METABOLIC PANEL - Abnormal; Notable for the following components:      Result Value   Glucose, Bld 168 (*)    All other components within normal limits  CBC WITH DIFFERENTIAL/PLATELET - Abnormal; Notable for the following components:   WBC 11.8 (*)    Neutro Abs 10.5 (*)    All other components within normal limits  LIPASE, BLOOD     EKG None  Radiology Ct Abdomen Pelvis W Contrast  Result Date: 06/14/2018 CLINICAL DATA:  Three days constipation. Five bowel movements today. Still with abdominal bloating and pain. EXAM: CT ABDOMEN AND PELVIS WITH CONTRAST TECHNIQUE: Multidetector CT imaging of the abdomen and pelvis was performed using the standard protocol following bolus administration of intravenous contrast. CONTRAST:  192mL ISOVUE-300 IOPAMIDOL (ISOVUE-300) INJECTION 61% COMPARISON:  01/20/2018 and 05/29/2017 FINDINGS: Lower chest: Subtle bibasilar linear atelectasis. Calcified plaque over the right coronary and left lateral circumflex coronary artery. Hepatobiliary: Liver, gallbladder and biliary tree are normal. Pancreas: Normal. Spleen: Normal. Adrenals/Urinary Tract: Adrenal glands are normal. Kidneys normal size. There is a 1 cm hypodensity over the mid to lower pole cortex of  the left kidney unchanged and likely a cyst but too small to characterize. No renal stones. Left ureter and bladder are normal. The right ureter demonstrates a 7 mm calcification projected just above the right UVJ unchanged from the previous exam as this is likely within the distal right ureter and causing no obstruction. This is also unchanged from 05/29/2017 in new since 11/15/2013 at which time there was a stone within the mid to upper pole right intrarenal collecting system. Stomach/Bowel: Mild gastric distension. There are a few fluid-filled minimally dilated small bowel loops over the lower abdomen. No clear transition point. Appendix is normal. Air is present throughout the colon. Vascular/Lymphatic: Minimal calcified plaque over the abdominal aorta. No adenopathy. Reproductive: Normal. Other: No significant free fluid or focal inflammatory change. Musculoskeletal: Mild degenerative change of the spine and hips. IMPRESSION: Several fluid-filled mildly dilated small bowel loops over the lower abdomen without clear transition point. Air throughout  the colon as findings may be due to ileus versus early/partial small bowel obstructive process. Chronic finding of 7 mm nonobstructing stone over the distal right ureter just above the UVJ. 1 cm left renal cortical hypodensity too small to characterize but likely a cyst. Aortic Atherosclerosis (ICD10-I70.0). Electronically Signed   By: Marin Olp M.D.   On: 06/14/2018 14:29    Procedures Procedures (including critical care time)  Medications Ordered in ED Medications  0.9 %  sodium chloride infusion (has no administration in time range)  sodium chloride 0.9 % bolus 500 mL ( Intravenous Stopped 06/14/18 1355)  ondansetron (ZOFRAN) injection 4 mg (4 mg Intravenous Given 06/14/18 1315)  HYDROmorphone (DILAUDID) injection 0.5 mg (0.5 mg Intravenous Given 06/14/18 1315)  iopamidol (ISOVUE-300) 61 % injection 100 mL (100 mLs Intravenous Contrast Given 06/14/18 1357)     Initial Impression / Assessment and Plan / ED Course  I have reviewed the triage vital signs and the nursing notes.  Pertinent labs & imaging results that were available during my care of the patient were reviewed by me and considered in my medical decision making (see chart for details).     Labs without significant abnormalities.  Is a slight elevation of the white blood cell count.  But CT scan raises concern for partial small bowel obstruction.  No transition point but does have air-fluid levels in the small bowel.  Based on this and the symptoms waxing and waning recommend patient be admitted for bowel rest.  Discussed with on-call general surgery who will consult Dr. Lucia Gaskins.  Also discussed with hospitalist at [facility] who will admit patient to a medical surgical bed.  Dr. Verlon Au admitting.   His symptoms improved here with antinausea medicine and pain medicine.  Do not feel patient needs an NG tube at this time.   Final Clinical Impressions(s) / ED Diagnoses   Final diagnoses:  Partial small bowel obstruction  [facility])    ED Discharge Orders    None       Fredia Sorrow, MD 06/14/18 1605    Fredia Sorrow, MD 06/14/18 [phone]

## 2018-06-14 NOTE — ED Notes (Signed)
Patient transported to CT 

## 2018-06-14 NOTE — ED Triage Notes (Signed)
Arrived w c/o abd pain w nausea  States was constipated x 2-3 days, states has had 2-3 bm today and feeling some better now

## 2018-06-15 ENCOUNTER — Inpatient Hospital Stay (HOSPITAL_COMMUNITY): Payer: Medicare Other

## 2018-06-15 DIAGNOSIS — K56609 Unspecified intestinal obstruction, unspecified as to partial versus complete obstruction: Secondary | ICD-10-CM

## 2018-06-15 DIAGNOSIS — K56600 Partial intestinal obstruction, unspecified as to cause: Principal | ICD-10-CM

## 2018-06-15 DIAGNOSIS — K5669 Other partial intestinal obstruction: Secondary | ICD-10-CM | POA: Diagnosis not present

## 2018-06-15 LAB — COMPREHENSIVE METABOLIC PANEL
ALK PHOS: 75 U/L (ref 38–126)
ALT: 20 U/L (ref 0–44)
ANION GAP: 7 (ref 5–15)
AST: 14 U/L — ABNORMAL LOW (ref 15–41)
Albumin: 3.5 g/dL (ref 3.5–5.0)
BUN: 15 mg/dL (ref 8–23)
CALCIUM: 8.6 mg/dL — AB (ref 8.9–10.3)
CO2: 26 mmol/L (ref 22–32)
Chloride: 104 mmol/L (ref 98–111)
Creatinine, Ser: 0.74 mg/dL (ref 0.61–1.24)
GFR calc Af Amer: >60 mL/min (ref >60)
GLUCOSE: 128 mg/dL — AB (ref 70–99)
POTASSIUM: 3.6 mmol/L (ref 3.5–5.1)
Sodium: 137 mmol/L (ref 135–145)
TOTAL PROTEIN: 6.5 g/dL (ref 6.5–8.1)
Total Bilirubin: 1.2 mg/dL (ref 0.3–1.2)

## 2018-06-15 LAB — CBC WITH DIFFERENTIAL/PLATELET
Basophils Absolute: 0 10*3/uL (ref 0.0–0.1)
Basophils Relative: 0 %
Eosinophils Absolute: 0 10*3/uL (ref 0.0–0.7)
Eosinophils Relative: 0 %
HCT: 44.1 % (ref 39.0–52.0)
HEMOGLOBIN: 15.1 g/dL (ref 13.0–17.0)
LYMPHS ABS: 1.5 10*3/uL (ref 0.7–4.0)
Lymphocytes Relative: 18 %
MCH: 30.8 pg (ref 26.0–34.0)
MCHC: 34.2 g/dL (ref 30.0–36.0)
MCV: 90 fL (ref 78.0–100.0)
MONOS PCT: 8 %
Monocytes Absolute: 0.7 10*3/uL (ref 0.1–1.0)
NEUTROS PCT: 74 %
Neutro Abs: 6.3 10*3/uL (ref 1.7–7.7)
Platelets: 167 10*3/uL (ref 150–400)
RBC: 4.9 MIL/uL (ref 4.22–5.81)
RDW: 13.2 % (ref 11.5–15.5)
WBC: 8.5 10*3/uL (ref 4.0–10.5)

## 2018-06-15 NOTE — Progress Notes (Signed)
[facility] Progress Note     Subjective: CC:  Multiple BMs overnight. Reports having flatus. Denies nausea or vomiting. Mobilizing.   Objective: Vital signs in last 24 hours: Temp:  [97.9 F (36.6 C)-99.1 F (37.3 C)] 98.8 F (37.1 C) (09/16 0617) Pulse Rate:  [62-74] 67 (09/16 0617) Resp:  [18-20] 18 (09/16 0617) BP: (143-164)/(77-92) 159/83 (09/16 0617) SpO2:  [94 %-98 %] 97 % (09/16 0617) Weight:  [104.3 kg] 104.3 kg (09/15 1111) Last BM Date: 06/14/18  Intake/Output from previous day: 09/15 0701 - 09/16 0700 In: 1854.7 [P.O.:240; I.V.:1111.8; IV Piggyback:502.8] Out: 750 [Urine:750] Intake/Output this shift: Total I/O In: 240 [P.O.:240] Out: 400 [Urine:400]  PE: Gen:  Alert, NAD, pleasant Card:  Regular rate and rhythm, pedal pulses 2+ BL Pulm:  Normal effort, clear to auscultation bilaterally Abd: Soft, non-tender, non-distended, bowel sounds present in all 4 quadrants, previous surgical scar. Skin: warm and dry, no rashes  Psych: A&Ox3   Lab Results:  Recent Labs    06/14/18 1312 06/15/18 0356  WBC 11.8* 8.5  HGB 16.5 15.1  HCT 46.7 44.1  PLT 165 167   BMET Recent Labs    06/14/18 1312 06/15/18 0356  NA 139 137  K 3.6 3.6  CL 104 104  CO2 26 26  GLUCOSE 168* 128*  BUN 19 15  CREATININE 0.66 0.74  CALCIUM 9.2 8.6*   PT/INR No results for input(s): LABPROT, INR in the last 72 hours. CMP     Component Value Date/Time   NA 137 06/15/2018 0356   NA 144 04/01/2018 1605   K 3.6 06/15/2018 0356   CL 104 06/15/2018 0356   CO2 26 06/15/2018 0356   GLUCOSE 128 (H) 06/15/2018 0356   BUN 15 06/15/2018 0356   BUN 26 04/01/2018 1605   CREATININE 0.74 06/15/2018 0356   CALCIUM 8.6 (L) 06/15/2018 0356   PROT 6.5 06/15/2018 0356   PROT 6.9 04/01/2018 1605   ALBUMIN 3.5 06/15/2018 0356   ALBUMIN 4.2 04/01/2018 1605   AST 14 (L) 06/15/2018 0356   ALT 20 06/15/2018 0356   ALKPHOS 75 06/15/2018 0356   BILITOT 1.2 06/15/2018 0356    BILITOT 0.5 04/01/2018 1605   GFRNONAA >60 06/15/2018 0356   GFRAA >60 06/15/2018 0356   Lipase     Component Value Date/Time   LIPASE 22 06/14/2018 1312       Studies/Results: Ct Abdomen Pelvis W Contrast  Result Date: 06/14/2018 CLINICAL DATA:  Three days constipation. Five bowel movements today. Still with abdominal bloating and pain. EXAM: CT ABDOMEN AND PELVIS WITH CONTRAST TECHNIQUE: Multidetector CT imaging of the abdomen and pelvis was performed using the standard protocol following bolus administration of intravenous contrast. CONTRAST:  185mL ISOVUE-300 IOPAMIDOL (ISOVUE-300) INJECTION 61% COMPARISON:  01/20/2018 and 05/29/2017 FINDINGS: Lower chest: Subtle bibasilar linear atelectasis. Calcified plaque over the right coronary and left lateral circumflex coronary artery. Hepatobiliary: Liver, gallbladder and biliary tree are normal. Pancreas: Normal. Spleen: Normal. Adrenals/Urinary Tract: Adrenal glands are normal. Kidneys normal size. There is a 1 cm hypodensity over the mid to lower pole cortex of the left kidney unchanged and likely a cyst but too small to characterize. No renal stones. Left ureter and bladder are normal. The right ureter demonstrates a 7 mm calcification projected just above the right UVJ unchanged from the previous exam as this is likely within the distal right ureter and causing no obstruction. This is also unchanged from 05/29/2017 in new since 11/15/2013 at which time  there was a stone within the mid to upper pole right intrarenal collecting system. Stomach/Bowel: Mild gastric distension. There are a few fluid-filled minimally dilated small bowel loops over the lower abdomen. No clear transition point. Appendix is normal. Air is present throughout the colon. Vascular/Lymphatic: Minimal calcified plaque over the abdominal aorta. No adenopathy. Reproductive: Normal. Other: No significant free fluid or focal inflammatory change. Musculoskeletal: Mild degenerative  change of the spine and hips. IMPRESSION: Several fluid-filled mildly dilated small bowel loops over the lower abdomen without clear transition point. Air throughout the colon as findings may be due to ileus versus early/partial small bowel obstructive process. Chronic finding of 7 mm nonobstructing stone over the distal right ureter just above the UVJ. 1 cm left renal cortical hypodensity too small to characterize but likely a cyst. Aortic Atherosclerosis (ICD10-I70.0). Electronically Signed   By: Marin Olp M.D.   On: 06/14/2018 14:29    Anti-infectives: Anti-infectives (From admission, onward)   None     Assessment/Plan SBO - resolving - advance diet as tolerated to SOFT - stable for discharge if tolerates a diet without recurrence of sxs   LOS: 1 day    Obie Dredge, [facility] Pager: [phone]

## 2018-06-15 NOTE — Discharge Summary (Addendum)
Physician Discharge Summary  [name] NKN:[id number] DOB: 1951-03-07 DOA: 06/14/2018  PCP: Chipper Herb, MD  Admit date: 06/14/2018 Discharge date: 06/15/2018  Admitted From: Home Disposition: Home Recommendations for Outpatient Follow-up:  1. Follow up with PCP in 1-2 weeks 2. Please obtain BMP/CBC in one week Home Health none Equipment/Devices: None  Discharge Condition stable CODE STATUS: Full code Diet recommendation: Cardiac Brief/Interim Summary: 67 year old male with hyperlipidemia HTN elevated PSA BMI 31  prediabetes fundoplication 2/2 hiatal hernia 1970s colonoscopy 09/2002 Dr. Watt Climes internal and external hemorrhoidsmelanoma Prior history 2000 05/2012 history of SBO and last visited ED for similar complaints 01/20/2018  Popcorn precipitates his attacks-states every time he has had an SBO this is occurred-has never had surgery-tells me had popcorn on Friday at a movie and then started developing discomfort-past 1 stool that was solid this morning and then had watery stool the rest of the day and severe abdominal pain-he says he feels nauseous but he cannot throw up-2/2 Nissen  He is passing some gas at this time-he does not have a fever, chills, blurred vision fainting spells weakness unilaterally or other findings   ED Course: Scan obtained as below-started on IV fluid given Dilaudid for pain with pain going from 9/10-->2/10  Discharge Diagnoses:  Active Problems:   SBO (small bowel obstruction) (HCC)  #1 SBO versus ileus patient reports having bowel movements multiple yesterday.  He has not had any bowel movements today but has been passing gas.  KUB done on the day of discharge 06/15/2018 shows stable bowel gas pattern with dilated small bowel loops with air-fluid level.  Patient is hungry and asking for food no abdominal pain nausea vomiting reported.  We will start him on full liquid diet and advance as tolerated.  If he does well he will be discharged home  today.  #2 hypertension restart home hypertensive medications.  #3 hyperlipidemia continue statin.  4.  Elevated PSA will need outpatient follow-up with the urologist. Discharge Instructions  Discharge Instructions    Call MD for:  difficulty breathing, headache or visual disturbances   Complete by:  As directed    Call MD for:  persistant nausea and vomiting   Complete by:  As directed    Call MD for:  severe uncontrolled pain   Complete by:  As directed    Diet - low sodium heart healthy   Complete by:  As directed    Increase activity slowly   Complete by:  As directed      Allergies as of 06/15/2018      Reactions   Penicillins Itching, Rash   Has patient had a PCN reaction causing immediate rash, facial/tongue/throat swelling, SOB or lightheadedness with hypotension: Yes Has patient had a PCN reaction causing severe rash involving mucus membranes or skin necrosis: No Has patient had a PCN reaction that required hospitalization: No Has patient had a PCN reaction occurring within the last 10 years: No If all of the above answers are "NO", then may proceed with Cephalosporin use.      Medication List    STOP taking these medications   aspirin-sod bicarb-citric acid 325 MG Tbef tablet Commonly known as:  ALKA-SELTZER   naproxen sodium 220 MG tablet Commonly known as:  ALEVE     TAKE these medications   amLODipine 5 MG tablet Commonly known as:  NORVASC TAKE 1 & 1/2 TABLET IN THE MORNING What changed:    how much to take  how to take this  when  to take this  additional instructions   aspirin 81 MG tablet Take 81 mg by mouth daily.   lisinopril 20 MG tablet Commonly known as:  PRINIVIL,ZESTRIL Take 1 tablet (20 mg total) by mouth daily.   metoprolol succinate 100 MG 24 hr tablet Commonly known as:  TOPROL-XL TAKE (1) TABLET DAILY IN THE MORNING. What changed:    how much to take  how to take this  when to take this  additional instructions    rosuvastatin 40 MG tablet Commonly known as:  CRESTOR TAKE (1) TABLET DAILY AS DIRECTED. What changed:    how much to take  how to take this  when to take this   Vitamin D 2000 units Caps Take 2,000 capsules by mouth daily.       Allergies  Allergen Reactions  . Penicillins Itching and Rash    Has patient had a PCN reaction causing immediate rash, facial/tongue/throat swelling, SOB or lightheadedness with hypotension: Yes Has patient had a PCN reaction causing severe rash involving mucus membranes or skin necrosis: No Has patient had a PCN reaction that required hospitalization: No Has patient had a PCN reaction occurring within the last 10 years: No If all of the above answers are "NO", then may proceed with Cephalosporin use.     Consultations: General surgery  Procedures/Studies: Ct Abdomen Pelvis W Contrast  Result Date: 06/14/2018 CLINICAL DATA:  Three days constipation. Five bowel movements today. Still with abdominal bloating and pain. EXAM: CT ABDOMEN AND PELVIS WITH CONTRAST TECHNIQUE: Multidetector CT imaging of the abdomen and pelvis was performed using the standard protocol following bolus administration of intravenous contrast. CONTRAST:  144mL ISOVUE-300 IOPAMIDOL (ISOVUE-300) INJECTION 61% COMPARISON:  01/20/2018 and 05/29/2017 FINDINGS: Lower chest: Subtle bibasilar linear atelectasis. Calcified plaque over the right coronary and left lateral circumflex coronary artery. Hepatobiliary: Liver, gallbladder and biliary tree are normal. Pancreas: Normal. Spleen: Normal. Adrenals/Urinary Tract: Adrenal glands are normal. Kidneys normal size. There is a 1 cm hypodensity over the mid to lower pole cortex of the left kidney unchanged and likely a cyst but too small to characterize. No renal stones. Left ureter and bladder are normal. The right ureter demonstrates a 7 mm calcification projected just above the right UVJ unchanged from the previous exam as this is likely  within the distal right ureter and causing no obstruction. This is also unchanged from 05/29/2017 in new since 11/15/2013 at which time there was a stone within the mid to upper pole right intrarenal collecting system. Stomach/Bowel: Mild gastric distension. There are a few fluid-filled minimally dilated small bowel loops over the lower abdomen. No clear transition point. Appendix is normal. Air is present throughout the colon. Vascular/Lymphatic: Minimal calcified plaque over the abdominal aorta. No adenopathy. Reproductive: Normal. Other: No significant free fluid or focal inflammatory change. Musculoskeletal: Mild degenerative change of the spine and hips. IMPRESSION: Several fluid-filled mildly dilated small bowel loops over the lower abdomen without clear transition point. Air throughout the colon as findings may be due to ileus versus early/partial small bowel obstructive process. Chronic finding of 7 mm nonobstructing stone over the distal right ureter just above the UVJ. 1 cm left renal cortical hypodensity too small to characterize but likely a cyst. Aortic Atherosclerosis (ICD10-I70.0). Electronically Signed   By: Marin Olp M.D.   On: 06/14/2018 14:29   Acute Abdominal Series  Result Date: 06/15/2018 CLINICAL DATA:  Follow-up small bowel obstruction EXAM: DG ABDOMEN ACUTE W/ 1V CHEST COMPARISON:  CT 06/14/2018 FINDINGS:  Heart is normal size.  Lungs are clear.  No effusions. Continued dilated small bowel loops with scattered air-fluid levels. There is gas noted within the colon. Bowel gas pattern not significantly changed since prior CT. No free air or organomegaly. IMPRESSION: Stable bowel-gas pattern with dilated small bowel loops with air-fluid level. Findings could reflect small bowel obstruction or ileus. Electronically Signed   By: Rolm Baptise M.D.   On: 06/15/2018 10:07    (Echo, Carotid, EGD, Colonoscopy, ERCP)    Subjective:   Discharge Exam: Vitals:   06/14/18 2031 06/15/18  0617  BP: (!) 143/77 (!) 159/83  Pulse: 64 67  Resp: 18 18  Temp: 99.1 F (37.3 C) 98.8 F (37.1 C)  SpO2: 97% 97%   Vitals:   06/14/18 1541 06/14/18 1700 06/14/18 2031 06/15/18 0617  BP: (!) 162/80 (!) 155/89 (!) 143/77 (!) 159/83  Pulse: 65 62 64 67  Resp: 18 20 18 18   Temp: 98 F (36.7 C) 98.2 F (36.8 C) 99.1 F (37.3 C) 98.8 F (37.1 C)  TempSrc: Oral Oral Oral Oral  SpO2: 95% 94% 97% 97%  Weight:      Height:        General: Pt is alert, awake, not in acute distress Cardiovascular: RRR, S1/S2 +, no rubs, no gallops Respiratory: CTA bilaterally, no wheezing, no rhonchi Abdominal: Soft, NT, ND, bowel sounds + Extremities: no edema, no cyanosis    The results of significant diagnostics from this hospitalization (including imaging, microbiology, ancillary and laboratory) are listed below for reference.     Microbiology: No results found for this or any previous visit (from the past 240 hour(s)).   Labs: BNP (last 3 results) No results for input(s): BNP in the last 8760 hours. Basic Metabolic Panel: Recent Labs  Lab 06/14/18 1312 06/15/18 0356  NA 139 137  K 3.6 3.6  CL 104 104  CO2 26 26  GLUCOSE 168* 128*  BUN 19 15  CREATININE 0.66 0.74  CALCIUM 9.2 8.6*   Liver Function Tests: Recent Labs  Lab 06/14/18 1312 06/15/18 0356  AST 21 14*  ALT 26 20  ALKPHOS 104 75  BILITOT 0.7 1.2  PROT 7.6 6.5  ALBUMIN 4.3 3.5   Recent Labs  Lab 06/14/18 1312  LIPASE 22   No results for input(s): AMMONIA in the last 168 hours. CBC: Recent Labs  Lab 06/14/18 1312 06/15/18 0356  WBC 11.8* 8.5  NEUTROABS 10.5* 6.3  HGB 16.5 15.1  HCT 46.7 44.1  MCV 87.6 90.0  PLT 165 167   Cardiac Enzymes: No results for input(s): CKTOTAL, CKMB, CKMBINDEX, TROPONINI in the last 168 hours. BNP: Invalid input(s): POCBNP CBG: No results for input(s): GLUCAP in the last 168 hours. D-Dimer No results for input(s): DDIMER in the last 72 hours. Hgb A1c No results  for input(s): HGBA1C in the last 72 hours. Lipid Profile No results for input(s): CHOL, HDL, LDLCALC, TRIG, CHOLHDL, LDLDIRECT in the last 72 hours. Thyroid function studies No results for input(s): TSH, T4TOTAL, T3FREE, THYROIDAB in the last 72 hours.  Invalid input(s): FREET3 Anemia work up No results for input(s): VITAMINB12, FOLATE, FERRITIN, TIBC, IRON, RETICCTPCT in the last 72 hours. Urinalysis    Component Value Date/Time   COLORURINE YELLOW 01/20/2018 0349   APPEARANCEUR CLEAR 01/20/2018 0349   APPEARANCEUR Clear 09/17/2016 0919   LABSPEC >1.030 (H) 01/20/2018 0349   PHURINE 6.0 01/20/2018 0349   GLUCOSEU NEGATIVE 01/20/2018 0349   HGBUR NEGATIVE 01/20/2018 0349  BILIRUBINUR NEGATIVE 01/20/2018 0349   BILIRUBINUR Negative 09/17/2016 0919   KETONESUR 15 (A) 01/20/2018 0349   PROTEINUR NEGATIVE 01/20/2018 0349   UROBILINOGEN negative 09/18/2015 0920   UROBILINOGEN 0.2 11/15/2013 1506   NITRITE NEGATIVE 01/20/2018 0349   LEUKOCYTESUR NEGATIVE 01/20/2018 0349   LEUKOCYTESUR Trace (A) 09/17/2016 0919   Sepsis Labs Invalid input(s): PROCALCITONIN,  WBC,  LACTICIDVEN Microbiology No results found for this or any previous visit (from the past 240 hour(s)).   Time coordinating discharge:  36 minutes  SIGNED:   Georgette Shell, MD  [facility] 06/15/2018, 11:06 AM Pager   If 7PM-7AM, please contact night-coverage [email or web address] Password [email or web address]

## 2018-06-15 NOTE — Progress Notes (Signed)
Discharge instructions given to pt and all questions were answered. Pt taken out via wheelchair and was picked up by his wife.

## 2018-07-28 ENCOUNTER — Ambulatory Visit (INDEPENDENT_AMBULATORY_CARE_PROVIDER_SITE_OTHER): Payer: BLUE CROSS/BLUE SHIELD

## 2018-07-28 DIAGNOSIS — Z23 Encounter for immunization: Secondary | ICD-10-CM

## 2018-10-07 ENCOUNTER — Ambulatory Visit (INDEPENDENT_AMBULATORY_CARE_PROVIDER_SITE_OTHER): Payer: BLUE CROSS/BLUE SHIELD | Admitting: Family Medicine

## 2018-10-07 ENCOUNTER — Encounter: Payer: Self-pay | Admitting: Family Medicine

## 2018-10-07 VITALS — BP 132/68 | HR 62 | Temp 97.6°F | Ht 72.0 in | Wt 241.0 lb

## 2018-10-07 DIAGNOSIS — E559 Vitamin D deficiency, unspecified: Secondary | ICD-10-CM

## 2018-10-07 DIAGNOSIS — J301 Allergic rhinitis due to pollen: Secondary | ICD-10-CM

## 2018-10-07 DIAGNOSIS — R972 Elevated prostate specific antigen [PSA]: Secondary | ICD-10-CM | POA: Diagnosis not present

## 2018-10-07 DIAGNOSIS — I10 Essential (primary) hypertension: Secondary | ICD-10-CM | POA: Diagnosis not present

## 2018-10-07 DIAGNOSIS — Z1211 Encounter for screening for malignant neoplasm of colon: Secondary | ICD-10-CM

## 2018-10-07 DIAGNOSIS — E7800 Pure hypercholesterolemia, unspecified: Secondary | ICD-10-CM

## 2018-10-07 DIAGNOSIS — R109 Unspecified abdominal pain: Secondary | ICD-10-CM

## 2018-10-07 NOTE — Addendum Note (Signed)
Addended by: Zannie Cove on: 10/07/2018 12:09 PM   Modules accepted: Orders

## 2018-10-07 NOTE — Patient Instructions (Addendum)
Medicare Annual Wellness Visit  [facility] and the medical providers at [facility] strive to bring you the best medical care.  In doing so we not only want to address your current medical conditions and concerns but also to detect new conditions early and prevent illness, disease and health-related problems.    Medicare offers a yearly Wellness Visit which allows our clinical staff to assess your need for preventative services including immunizations, lifestyle education, counseling to decrease risk of preventable diseases and screening for fall risk and other medical concerns.    This visit is provided free of charge (no copay) for all Medicare recipients. The clinical pharmacists at [facility] have begun to conduct these Wellness Visits which will also include a thorough review of all your medications.    As you primary medical provider recommend that you make an appointment for your Annual Wellness Visit if you have not done so already this year.  You may set up this appointment before you leave today or you may call back ([phone]) and schedule an appointment.  Please make sure when you call that you mention that you are scheduling your Annual Wellness Visit with the clinical pharmacist so that the appointment may be made for the proper length of time.     Continue current medications. Continue good therapeutic lifestyle changes which include good diet and exercise. Fall precautions discussed with patient. If an FOBT was given today- please return it to our front desk. If you are over 69 years old - you may need Prevnar 37 or the adult Pneumonia vaccine.  **Flu shots are available--- please call and schedule a FLU-CLINIC appointment**  After your visit with [place] today you will receive a survey in the mail or online from [employer] regarding your care with [place]. Please take a moment to fill this out. Your feedback is very  important to [place] as you can help [place] better understand your patient needs as well as improve your experience and satisfaction. WE CARE ABOUT YOU!!!   \Follow-up with Dr. Jeffie Pollock as planned We will call with lab work results as soon as these results become available We will send a copy of the PSA result to the urologist Follow-up with Dr. Lu Duffel guide the gastroenterologist as needed Patient had a stress test in this office in 2018.  We will consider doing a referral for a good cardiac evaluation within the next year.

## 2018-10-07 NOTE — Progress Notes (Signed)
Subjective:    Patient ID: [name], male    DOB: Aug 06, 1951, 68 y.o.   MRN: [id number]  HPI Pt here for follow up and management of chronic medical problems which includes hypertension and hyperlipidemia. He is taking medication regularly.  The patient is doing well overall.  A significant past history was that in September he ended up in the emergency room because of a small bowel obstruction and it seems like popcorn is associated with this every time.  He will be given an FOBT to return today and will get lab work today.  The discharge summary from the emergency room was reviewed with the patient from September.  The patient is pleasant and smiling and today has no complaints of chest pain shortness of breath trouble with swallowing heartburn indigestion nausea vomiting diarrhea blood in the stool or black tarry bowel movements or change in bowel habits.  He is currently passing his water well and he is doing his follow-up with the urologist after 1 year in February.  We will do his PSA here and make sure that a copy of this report gets sent to his urologist.  He is up-to-date on his colonoscopies.  He has been eating some popcorn but only one kernel at a time and so far that has not caused any recurrences of small bowel obstruction.  He is also trying to drink plenty of water and fluids and stay well-hydrated.  He does indicate that if he does a lot of lifting a few hours later he will have some chest discomfort and understands that it comes from the lifting and not from his heart he recognizes this kind of discomfort.     Patient Active Problem List   Diagnosis Date Noted  . BMI 31.0-31.9,adult 04/01/2018  . Vitamin D deficiency 04/01/2018  . Elevated PSA 03/19/2016  . Pre-diabetes 03/18/2014  . Partial small bowel obstruction ([facility]) 03/22/2012  . Hypertension   . Hyperlipidemia   . Reflux   . Vertigo, intermittent   . Other testicular hypofunction    Outpatient Encounter Medications  as of 10/07/2018  Medication Sig  . amLODipine (NORVASC) 5 MG tablet TAKE 1 & 1/2 TABLET IN THE MORNING (Patient taking differently: Take 7.5 mg by mouth daily after breakfast. )  . aspirin 81 MG tablet Take 81 mg by mouth daily.  . Cholecalciferol (VITAMIN D) 2000 UNITS CAPS Take 2,000 capsules by mouth daily.   Marland Kitchen lisinopril (PRINIVIL,ZESTRIL) 20 MG tablet Take 1 tablet (20 mg total) by mouth daily.  . metoprolol succinate (TOPROL-XL) 100 MG 24 hr tablet TAKE (1) TABLET DAILY IN THE MORNING. (Patient taking differently: Take 100 mg by mouth daily after breakfast. )  . rosuvastatin (CRESTOR) 40 MG tablet TAKE (1) TABLET DAILY AS DIRECTED. (Patient taking differently: Take 20 mg by mouth every other day. TAKE (1) TABLET DAILY AS DIRECTED.)   No facility-administered encounter medications on file as of 10/07/2018.      Review of Systems  Constitutional: Negative.   HENT: Negative.   Eyes: Negative.   Respiratory: Negative.   Cardiovascular: Negative.   Gastrointestinal: Negative.   Endocrine: Negative.   Genitourinary: Negative.   Musculoskeletal: Negative.   Skin: Negative.   Allergic/Immunologic: Negative.   Neurological: Negative.   Hematological: Negative.   Psychiatric/Behavioral: Negative.        Objective:   Physical Exam Vitals signs and nursing note reviewed.  Constitutional:      Appearance: He is well-developed. He is obese. He  is not ill-appearing.     Comments: Patient is pleasant calm and relaxed  HENT:     Head: Normocephalic and atraumatic.     Comments: Nasal congestion left greater than right and TMs are normal    Right Ear: External ear normal.     Left Ear: External ear normal.     Nose: Nose normal.     Mouth/Throat:     Mouth: Mucous membranes are moist.     Pharynx: Oropharynx is clear. No oropharyngeal exudate.  Eyes:     General: No scleral icterus.       Right eye: No discharge.        Left eye: No discharge.     Extraocular Movements: Extraocular  movements intact.     Conjunctiva/sclera: Conjunctivae normal.     Pupils: Pupils are equal, round, and reactive to light.     Comments: Up-to-date on eye exams  Neck:     Musculoskeletal: Normal range of motion and neck supple.     Thyroid: No thyromegaly.     Trachea: No tracheal deviation.  Cardiovascular:     Rate and Rhythm: Normal rate and regular rhythm.     Heart sounds: Normal heart sounds. No murmur. No gallop.      Comments: The heart is regular at 72/min without murmurs or gallops Pulmonary:     Effort: Pulmonary effort is normal. No respiratory distress.     Breath sounds: Normal breath sounds. No wheezing or rales.     Comments: There anteriorly and posteriorly without chest wall masses or axillary adenopathy Chest:     Chest wall: No tenderness.  Abdominal:     General: Abdomen is flat. Bowel sounds are normal. There is no distension or abdominal bruit.     Palpations: Abdomen is soft. There is no mass.     Tenderness: There is no abdominal tenderness. There is no guarding or rebound.     Hernia: No hernia is present.     Comments: Abdomen is slightly obese without liver or spleen enlargement masses or inguinal adenopathy  Genitourinary:    Comments: This exam is deferred to the urologist in February that he already has an appointment to see Musculoskeletal: Normal range of motion.        General: No tenderness.  Lymphadenopathy:     Cervical: No cervical adenopathy.  Skin:    General: Skin is warm and dry.     Coloration: Skin is not pale.     Findings: No erythema or rash.  Neurological:     Mental Status: He is alert and oriented to person, place, and time.     Cranial Nerves: No cranial nerve deficit.     Deep Tendon Reflexes: Reflexes are normal and symmetric.  Psychiatric:        Mood and Affect: Mood normal. Mood is not anxious or depressed.        Behavior: Behavior normal.        Thought Content: Thought content normal.        Judgment: Judgment  normal.     Comments: The patient's mood affect and behavior are all normal for him.    BP 132/68 (BP Location: Left Arm)   Pulse 62   Temp 97.6 F (36.4 C) (Oral)   Ht 6' (1.829 m)   Wt 241 lb (109.3 kg)   BMI 32.69 kg/m         Assessment & Plan:  1. Pure hypercholesterolemia -Continue with current  treatment pending results of lab work - CBC with Differential/Platelet - Lipid panel  2. Hypertension -Blood pressure is good today and he will continue with current treatment - CBC with Differential/Platelet - BMP8+EGFR - Hepatic function panel  3. Vitamin D deficiency -Continue with vitamin D replacement pending results of lab work - CBC with Differential/Platelet - VITAMIN D 25 Hydroxy (Vit-D Deficiency, Fractures)  4. Elevated PSA -Patient has upcoming visit with urologist in February and we will check a PSA here make sure Dr. Jeffie Pollock gets a copy of this report - CBC with Differential/Platelet - PSA, total and free  5. Seasonal allergic rhinitis due to pollen -Continue current treatment  6. Abdominal pain, unspecified abdominal location -Follow-up with Dr. Watt Climes as needed -Continue to watch diet closely because of history of partial small bowel obstruction  Patient Instructions                       Medicare Annual Wellness Visit  [facility] and the medical providers at [facility] strive to bring you the best medical care.  In doing so we not only want to address your current medical conditions and concerns but also to detect new conditions early and prevent illness, disease and health-related problems.    Medicare offers a yearly Wellness Visit which allows our clinical staff to assess your need for preventative services including immunizations, lifestyle education, counseling to decrease risk of preventable diseases and screening for fall risk and other medical concerns.    This visit is provided free of charge (no copay) for all Medicare  recipients. The clinical pharmacists at [facility] have begun to conduct these Wellness Visits which will also include a thorough review of all your medications.    As you primary medical provider recommend that you make an appointment for your Annual Wellness Visit if you have not done so already this year.  You may set up this appointment before you leave today or you may call back ([phone]) and schedule an appointment.  Please make sure when you call that you mention that you are scheduling your Annual Wellness Visit with the clinical pharmacist so that the appointment may be made for the proper length of time.     Continue current medications. Continue good therapeutic lifestyle changes which include good diet and exercise. Fall precautions discussed with patient. If an FOBT was given today- please return it to our front desk. If you are over 35 years old - you may need Prevnar 43 or the adult Pneumonia vaccine.  **Flu shots are available--- please call and schedule a FLU-CLINIC appointment**  After your visit with [place] today you will receive a survey in the mail or online from [employer] regarding your care with [place]. Please take a moment to fill this out. Your feedback is very important to [place] as you can help [place] better understand your patient needs as well as improve your experience and satisfaction. WE CARE ABOUT YOU!!!   \Follow-up with Dr. Jeffie Pollock as planned We will call with lab work results as soon as these results become available We will send a copy of the PSA result to the urologist Follow-up with Dr. Lu Duffel guide the gastroenterologist as needed Patient had a stress test in this office in 2018.  We will consider doing a referral for a good cardiac evaluation within the next year.  Arrie Senate MD

## 2018-10-08 LAB — HEPATIC FUNCTION PANEL
ALT: 18 IU/L (ref 0–44)
AST: 13 IU/L (ref 0–40)
Albumin: 4.4 g/dL (ref 3.6–4.8)
Alkaline Phosphatase: 129 IU/L — ABNORMAL HIGH (ref 39–117)
Bilirubin Total: 0.6 mg/dL (ref 0.0–1.2)
Bilirubin, Direct: 0.17 mg/dL (ref 0.00–0.40)
Total Protein: 7 g/dL (ref 6.0–8.5)

## 2018-10-08 LAB — BMP8+EGFR
BUN/Creatinine Ratio: 21 (ref 10–24)
BUN: 20 mg/dL (ref 8–27)
CO2: 24 mmol/L (ref 20–29)
Calcium: 9.3 mg/dL (ref 8.6–10.2)
Chloride: 103 mmol/L (ref 96–106)
Creatinine, Ser: 0.96 mg/dL (ref 0.76–1.27)
GFR calc Af Amer: 94 mL/min/{1.73_m2} (ref >59)
GFR calc non Af Amer: 81 mL/min/{1.73_m2} (ref >59)
Glucose: 144 mg/dL — ABNORMAL HIGH (ref 65–99)
POTASSIUM: 3.9 mmol/L (ref 3.5–5.2)
Sodium: 141 mmol/L (ref 134–144)

## 2018-10-08 LAB — CBC WITH DIFFERENTIAL/PLATELET
BASOS ABS: 0.1 10*3/uL (ref 0.0–0.2)
Basos: 1 %
EOS (ABSOLUTE): 0.1 10*3/uL (ref 0.0–0.4)
EOS: 2 %
HEMATOCRIT: 43.4 % (ref 37.5–51.0)
HEMOGLOBIN: 14.7 g/dL (ref 13.0–17.7)
IMMATURE GRANS (ABS): 0 10*3/uL (ref 0.0–0.1)
IMMATURE GRANULOCYTES: 0 %
LYMPHS ABS: 1.5 10*3/uL (ref 0.7–3.1)
Lymphs: 27 %
MCH: 30.8 pg (ref 26.6–33.0)
MCHC: 33.9 g/dL (ref 31.5–35.7)
MCV: 91 fL (ref 79–97)
MONOCYTES: 7 %
Monocytes Absolute: 0.4 10*3/uL (ref 0.1–0.9)
NEUTROS PCT: 63 %
Neutrophils Absolute: 3.4 10*3/uL (ref 1.4–7.0)
Platelets: 172 10*3/uL (ref 150–450)
RBC: 4.77 x10E6/uL (ref 4.14–5.80)
RDW: 13 % (ref 11.6–15.4)
WBC: 5.4 10*3/uL (ref 3.4–10.8)

## 2018-10-08 LAB — VITAMIN D 25 HYDROXY (VIT D DEFICIENCY, FRACTURES): Vit D, 25-Hydroxy: 48.8 ng/mL (ref 30.0–100.0)

## 2018-10-08 LAB — FECAL OCCULT BLOOD, IMMUNOCHEMICAL: Fecal Occult Bld: POSITIVE — AB

## 2018-10-08 LAB — LIPID PANEL
Chol/HDL Ratio: 2.1 ratio (ref 0.0–5.0)
Cholesterol, Total: 99 mg/dL — ABNORMAL LOW (ref 100–199)
HDL: 47 mg/dL (ref >39)
LDL Calculated: 35 mg/dL (ref 0–99)
Triglycerides: 84 mg/dL (ref 0–149)
VLDL Cholesterol Cal: 17 mg/dL (ref 5–40)

## 2018-10-08 LAB — PSA, TOTAL AND FREE
PSA, Free Pct: 17.9 %
PSA, Free: 0.84 ng/mL
Prostate Specific Ag, Serum: 4.7 ng/mL — ABNORMAL HIGH (ref 0.0–4.0)

## 2018-10-10 LAB — HGB A1C W/O EAG: Hgb A1c MFr Bld: 7 % — ABNORMAL HIGH (ref 4.8–5.6)

## 2018-10-12 ENCOUNTER — Other Ambulatory Visit: Payer: Self-pay | Admitting: *Deleted

## 2018-10-12 MED ORDER — METFORMIN HCL 500 MG PO TABS: 500.0000 mg | ORAL_TABLET | Freq: Every day | ORAL | 3 refills | Status: DC

## 2018-10-21 ENCOUNTER — Encounter: Payer: Self-pay | Admitting: Pharmacist Clinician (PhC)/ Clinical Pharmacy Specialist

## 2018-10-21 ENCOUNTER — Ambulatory Visit (INDEPENDENT_AMBULATORY_CARE_PROVIDER_SITE_OTHER): Payer: BLUE CROSS/BLUE SHIELD | Admitting: Pharmacist Clinician (PhC)/ Clinical Pharmacy Specialist

## 2018-10-21 VITALS — BP 128/70

## 2018-10-21 DIAGNOSIS — E119 Type 2 diabetes mellitus without complications: Secondary | ICD-10-CM

## 2018-10-21 DIAGNOSIS — E7800 Pure hypercholesterolemia, unspecified: Secondary | ICD-10-CM

## 2018-10-21 MED ORDER — PRAVASTATIN SODIUM 20 MG PO TABS: 20.0000 mg | ORAL_TABLET | Freq: Every day | ORAL | 3 refills | Status: DC

## 2018-10-21 NOTE — Progress Notes (Signed)
Diabetes Follow-Up Visit Chief Complaint:  No chief complaint on file.    Exam Regularity:  RRR Edema:  neg  Polyuria:  neg  Polydipsia:  neg Polyphagia:  neg  BMI:  There is no height or weight on file to calculate BMI.   Weight changes:  None lost 35lbs in the past but has gained 25lbs back. Wants to loose weight General Appearance:  alert, oriented, no acute distress Mood/Affect:  normal  HPI:  New onset Type 2 Diabetes  Low fat/carbohydrate diet?  Yes Nicotine Abuse?  No Medication Compliance?  Yes Exercise?  Yes- walks at work and about 15 minutes a day to lunch Alcohol Abuse?  No  Home BG Monitoring:  Checking 0 times a day. Average:  Getting test strips today  High:    Low:      Lab Results  Component Value Date   HGBA1C 7.0 (H) 10/07/2018    No results found for: Derl Barrow  Lab Results  Component Value Date   CHOL 99 (L) 10/07/2018   HDL 47 10/07/2018   LDLCALC 35 10/07/2018   TRIG 84 10/07/2018   CHOLHDL 2.1 10/07/2018      Assessment: 1.  Diabetes.  New onset with A1C of 7% he was seen for prediabetes in 2015 so the onset has been gradual 2.  Blood Pressure.  Well controlled on amlodipine and lisinopril and Toprol XL 100mg  3.  Lipids.  LDL runs between 20's-30's he is taking Crestor 40mg  1/2 tablet every other day 4.  Foot Care.  Reviewed with patient 5.  Dental Care.  Biannual cleanings 6.  Eye Care/Exam.  Annual eye exam  Recommendations: 1.  Patient is counseled on appropriate foot care. 2.  BP goal < 130/80. 3.  LDL goal of < 100, HDL > 40 and TG < 150. 4.  Eye Exam yearly and Dental Exam every 6 months. 5.  Dietary recommendations:  Place patient on 1600-1800 calorie CHO meal plan.  Spent 30 minutes reviewing diabetic meal planning with patient and gave written material. 6.  Physical Activity recommendations:  30-45 minutes of continuous walking daily 7.  Medication recommendations at this time are as follows:  Continue metformin 500mg   qam until next A1c in 12 weeks. 8.  Discontinue crestor due to low LDL and higher risk for hyperglycemia versus other statins.  Start Pravastatin 20mg  daily. 8.  Return to clinic in 3-4 months   Time spent counseling patient:  45 minutes  Physician time spent with patient:    Referring provider:  Laurance Flatten   PharmD:  Memory Argue, PharmD, CPP, CLS

## 2018-10-26 ENCOUNTER — Other Ambulatory Visit: Payer: Medicare Other

## 2018-10-26 DIAGNOSIS — Z1212 Encounter for screening for malignant neoplasm of rectum: Secondary | ICD-10-CM

## 2018-10-28 LAB — FECAL OCCULT BLOOD, IMMUNOCHEMICAL: Fecal Occult Bld: NEGATIVE

## 2018-11-05 ENCOUNTER — Other Ambulatory Visit: Payer: Self-pay | Admitting: Family Medicine

## 2018-11-19 DIAGNOSIS — D485 Neoplasm of uncertain behavior of skin: Secondary | ICD-10-CM | POA: Diagnosis not present

## 2018-11-19 DIAGNOSIS — Z08 Encounter for follow-up examination after completed treatment for malignant neoplasm: Secondary | ICD-10-CM | POA: Diagnosis not present

## 2018-11-19 DIAGNOSIS — L821 Other seborrheic keratosis: Secondary | ICD-10-CM | POA: Diagnosis not present

## 2018-11-19 DIAGNOSIS — Z1283 Encounter for screening for malignant neoplasm of skin: Secondary | ICD-10-CM | POA: Diagnosis not present

## 2018-11-19 DIAGNOSIS — C44319 Basal cell carcinoma of skin of other parts of face: Secondary | ICD-10-CM | POA: Diagnosis not present

## 2018-11-19 DIAGNOSIS — D2272 Melanocytic nevi of left lower limb, including hip: Secondary | ICD-10-CM | POA: Diagnosis not present

## 2018-11-19 DIAGNOSIS — Z85820 Personal history of malignant melanoma of skin: Secondary | ICD-10-CM | POA: Diagnosis not present

## 2018-11-25 DIAGNOSIS — R972 Elevated prostate specific antigen [PSA]: Secondary | ICD-10-CM | POA: Diagnosis not present

## 2018-11-25 DIAGNOSIS — N5201 Erectile dysfunction due to arterial insufficiency: Secondary | ICD-10-CM | POA: Diagnosis not present

## 2018-11-25 DIAGNOSIS — N401 Enlarged prostate with lower urinary tract symptoms: Secondary | ICD-10-CM | POA: Diagnosis not present

## 2018-11-25 DIAGNOSIS — R3915 Urgency of urination: Secondary | ICD-10-CM | POA: Diagnosis not present

## 2018-11-30 DIAGNOSIS — L988 Other specified disorders of the skin and subcutaneous tissue: Secondary | ICD-10-CM | POA: Diagnosis not present

## 2018-11-30 DIAGNOSIS — D485 Neoplasm of uncertain behavior of skin: Secondary | ICD-10-CM | POA: Diagnosis not present

## 2018-11-30 DIAGNOSIS — X32XXXD Exposure to sunlight, subsequent encounter: Secondary | ICD-10-CM | POA: Diagnosis not present

## 2018-11-30 DIAGNOSIS — L570 Actinic keratosis: Secondary | ICD-10-CM | POA: Diagnosis not present

## 2019-01-01 ENCOUNTER — Other Ambulatory Visit: Payer: Self-pay | Admitting: Family Medicine

## 2019-01-28 ENCOUNTER — Ambulatory Visit: Payer: Medicare Other | Admitting: Family Medicine

## 2019-02-08 ENCOUNTER — Other Ambulatory Visit: Payer: Self-pay | Admitting: Family Medicine

## 2019-03-29 ENCOUNTER — Other Ambulatory Visit: Payer: Self-pay | Admitting: Family Medicine

## 2019-04-13 ENCOUNTER — Other Ambulatory Visit: Payer: Self-pay | Admitting: Family Medicine

## 2019-04-14 ENCOUNTER — Ambulatory Visit: Payer: Medicare Other | Admitting: Family Medicine

## 2019-04-14 MED ORDER — METFORMIN HCL 500 MG PO TABS: ORAL_TABLET | ORAL | 0 refills | Status: DC

## 2019-04-14 MED ORDER — AMLODIPINE BESYLATE 5 MG PO TABS: ORAL_TABLET | ORAL | 0 refills | Status: DC

## 2019-04-14 MED ORDER — METOPROLOL SUCCINATE ER 100 MG PO TB24: ORAL_TABLET | ORAL | 0 refills | Status: DC

## 2019-04-14 MED ORDER — LISINOPRIL 20 MG PO TABS: 20.0000 mg | ORAL_TABLET | Freq: Every day | ORAL | 0 refills | Status: DC

## 2019-04-14 NOTE — Telephone Encounter (Signed)
Refills sent to pharmacy.  Patient will need to be seen for any further refills

## 2019-04-15 ENCOUNTER — Other Ambulatory Visit: Payer: Self-pay | Admitting: *Deleted

## 2019-04-18 ENCOUNTER — Encounter (HOSPITAL_BASED_OUTPATIENT_CLINIC_OR_DEPARTMENT_OTHER): Payer: Self-pay

## 2019-04-18 ENCOUNTER — Emergency Department (HOSPITAL_BASED_OUTPATIENT_CLINIC_OR_DEPARTMENT_OTHER)
Admission: EM | Admit: 2019-04-18 | Discharge: 2019-04-19 | Disposition: A | Discharge status: Home / Self Care | Payer: BC Managed Care – PPO | Attending: Emergency Medicine | Admitting: Emergency Medicine

## 2019-04-18 ENCOUNTER — Other Ambulatory Visit: Payer: Self-pay

## 2019-04-18 DIAGNOSIS — R109 Unspecified abdominal pain: Secondary | ICD-10-CM

## 2019-04-18 DIAGNOSIS — Z79899 Other long term (current) drug therapy: Secondary | ICD-10-CM | POA: Diagnosis not present

## 2019-04-18 DIAGNOSIS — E119 Type 2 diabetes mellitus without complications: Secondary | ICD-10-CM | POA: Diagnosis not present

## 2019-04-18 DIAGNOSIS — I10 Essential (primary) hypertension: Secondary | ICD-10-CM | POA: Diagnosis not present

## 2019-04-18 HISTORY — DX: Type 2 diabetes mellitus without complications: E11.9

## 2019-04-18 NOTE — ED Triage Notes (Signed)
Pt c/o abd pain that started around 1800 this evening. Pt states it feels similar to the last time he had an SBO. Pt did have 1 normal BM, but is unable to pass gas. No N/V.

## 2019-04-19 ENCOUNTER — Emergency Department (HOSPITAL_BASED_OUTPATIENT_CLINIC_OR_DEPARTMENT_OTHER): Payer: BC Managed Care – PPO

## 2019-04-19 DIAGNOSIS — R109 Unspecified abdominal pain: Secondary | ICD-10-CM | POA: Diagnosis not present

## 2019-04-19 LAB — COMPREHENSIVE METABOLIC PANEL
ALT: 17 U/L (ref 0–44)
AST: 15 U/L (ref 15–41)
Albumin: 4.2 g/dL (ref 3.5–5.0)
Alkaline Phosphatase: 113 U/L (ref 38–126)
Anion gap: 11 (ref 5–15)
BUN: 25 mg/dL — ABNORMAL HIGH (ref 8–23)
CO2: 25 mmol/L (ref 22–32)
Calcium: 9.4 mg/dL (ref 8.9–10.3)
Chloride: 104 mmol/L (ref 98–111)
Creatinine, Ser: 1.01 mg/dL (ref 0.61–1.24)
GFR calc Af Amer: >60 mL/min (ref >60)
GFR calc non Af Amer: >60 mL/min (ref >60)
Glucose, Bld: 145 mg/dL — ABNORMAL HIGH (ref 70–99)
Potassium: 3.8 mmol/L (ref 3.5–5.1)
Sodium: 140 mmol/L (ref 135–145)
Total Bilirubin: 0.7 mg/dL (ref 0.3–1.2)
Total Protein: 7.3 g/dL (ref 6.5–8.1)

## 2019-04-19 LAB — CBC
HCT: 46 % (ref 39.0–52.0)
Hemoglobin: 15.3 g/dL (ref 13.0–17.0)
MCH: 30.3 pg (ref 26.0–34.0)
MCHC: 33.3 g/dL (ref 30.0–36.0)
MCV: 91.1 fL (ref 80.0–100.0)
Platelets: 168 10*3/uL (ref 150–400)
RBC: 5.05 MIL/uL (ref 4.22–5.81)
RDW: 13.5 % (ref 11.5–15.5)
WBC: 10.1 10*3/uL (ref 4.0–10.5)
nRBC: 0 % (ref 0.0–0.2)

## 2019-04-19 LAB — URINALYSIS, ROUTINE W REFLEX MICROSCOPIC
Bilirubin Urine: NEGATIVE
Glucose, UA: 100 mg/dL — AB
Hgb urine dipstick: NEGATIVE
Ketones, ur: NEGATIVE mg/dL
Leukocytes,Ua: NEGATIVE
Nitrite: NEGATIVE
Protein, ur: NEGATIVE mg/dL
Specific Gravity, Urine: >1.03 — ABNORMAL HIGH (ref 1.005–1.030)
pH: 6 (ref 5.0–8.0)

## 2019-04-19 LAB — LIPASE, BLOOD: Lipase: 27 U/L (ref 11–51)

## 2019-04-19 MED ORDER — ONDANSETRON HCL 4 MG/2ML IJ SOLN
4.0000 mg | Freq: Once | INTRAMUSCULAR | Status: AC
  Administered 2019-04-19: 4 mg via INTRAVENOUS
  Filled 2019-04-19: qty 2

## 2019-04-19 MED ORDER — MORPHINE SULFATE (PF) 4 MG/ML IV SOLN
INTRAVENOUS | Status: AC
  Filled 2019-04-19: qty 1

## 2019-04-19 MED ORDER — SODIUM CHLORIDE 0.9 % IV BOLUS
1000.0000 mL | Freq: Once | INTRAVENOUS | Status: AC
  Administered 2019-04-19: 1000 mL via INTRAVENOUS

## 2019-04-19 MED ORDER — IOHEXOL 300 MG/ML  SOLN
100.0000 mL | Freq: Once | INTRAMUSCULAR | Status: AC
  Administered 2019-04-19: 100 mL via INTRAVENOUS

## 2019-04-19 MED ORDER — MORPHINE SULFATE (PF) 4 MG/ML IV SOLN
4.0000 mg | Freq: Once | INTRAVENOUS | Status: AC
  Administered 2019-04-19: 4 mg via INTRAVENOUS

## 2019-04-19 NOTE — ED Provider Notes (Signed)
[facility] Provider Note   CSN: [id number] Arrival date & time: 04/18/19  2344     History   Chief Complaint Chief Complaint  Patient presents with   Abdominal Pain    HPI Nicola Quesnell is a 68 y.o. male.     Patient c/o abdominal pain onset 6 pm tonight. Symptoms acute onset, moderate, persistent, mid abd, non radiating. Has had normal appetite. No nvd. Had normal bm around 7 pm tonight. No dysuria, hematuria or gu c/o. No scrotal or testicular pain. No back or flank pain. No fever or chills. No chest pain or sob. No cough or uri symptoms. Feels similar to prior sbo.   The history is provided by the patient.  Abdominal Pain Associated symptoms: no chest pain, no constipation, no cough, no diarrhea, no dysuria, no fever, no shortness of breath, no sore throat and no vomiting     Past Medical History:  Diagnosis Date   Arthritis    Atypical small acinar proliferation of prostate    Benign paroxysmal positional vertigo    Diabetes mellitus without complication (HCC)    Elevated PSA    Essential hypertension, benign    GERD (gastroesophageal reflux disease)    History of basal cell carcinoma excision    few times   History of hiatal hernia    History of kidney stones    History of melanoma excision    x1  localized   History of small bowel obstruction    2009  &  2013--  both times resolved without surgical intervevtion   Hyperlipidemia    LAFB (left anterior fascicular block)    Prediabetes     Patient Active Problem List   Diagnosis Date Noted   BMI 31.0-31.9,adult 04/01/2018   Vitamin D deficiency 04/01/2018   Elevated PSA 03/19/2016   Pre-diabetes 03/18/2014   Partial small bowel obstruction ([place]) 03/22/2012   Hypertension    Hyperlipidemia    Reflux    Vertigo, intermittent    Other testicular hypofunction     Past Surgical History:  Procedure Laterality Date   GASTRIC FUNDOPLICATION  8413'K     LUMBAR DISC SURGERY  x3   last one 1980's   PROSTATE BIOPSY N/A 11/21/2015   Procedure: PROSTATE BIOPSY AND ULTRASOUND;  Surgeon: Irine Seal, MD;  Location: [facility];  Service: Urology;  Laterality: N/A;        Home Medications    Prior to Admission medications   Medication Sig Start Date End Date Taking? Authorizing Provider  amLODipine (NORVASC) 5 MG tablet TAKE 1 & 1/2 TABLET IN THE MORNING.  Needs to be seen for further refills. 04/14/19   Hassell Done Mary-Margaret, FNP  aspirin 81 MG tablet Take 81 mg by mouth daily.    [provider]  Cholecalciferol (VITAMIN D) 2000 UNITS CAPS Take 2,000 capsules by mouth daily.     [provider]  lisinopril (ZESTRIL) 20 MG tablet Take 1 tablet (20 mg total) by mouth daily. Needs to be seen for further refills 04/14/19   Hassell Done, Mary-Margaret, FNP  metFORMIN (GLUCOPHAGE) 500 MG tablet TAKE (1) TABLET DAILY WITH BREAKFAST. Needs to be seen for further refills 04/14/19   Chevis Pretty, FNP  metoprolol succinate (TOPROL-XL) 100 MG 24 hr tablet Take with or immediately following a meal.  Needs to be seen for further refills 04/14/19   Hassell Done, Mary-Margaret, FNP  pravastatin (PRAVACHOL) 20 MG tablet Take 1 tablet (20 mg total) by mouth daily.  10/21/18   Memory Argue, PharmD    Family History Family History  Problem Relation Age of Onset   Hypertension Mother    Melanoma Mother    Hypertension Father    Diabetes Father     Social History Social History   Tobacco Use   Smoking status: Never Smoker   Smokeless tobacco: Never Used  Substance Use Topics   Alcohol use: Yes    Comment: rare   Drug use: No     Allergies   Penicillins   Review of Systems Review of Systems  Constitutional: Negative for fever.  HENT: Negative for sore throat.   Eyes: Negative for redness.  Respiratory: Negative for cough and shortness of breath.   Cardiovascular: Negative for chest pain.   Gastrointestinal: Positive for abdominal pain. Negative for constipation, diarrhea and vomiting.  Endocrine: Negative for polyuria.  Genitourinary: Negative for dysuria, flank pain and testicular pain.  Musculoskeletal: Negative for back pain and neck pain.  Skin: Negative for rash.  Neurological: Negative for headaches.  Hematological: Does not bruise/bleed easily.  Psychiatric/Behavioral: Negative for confusion.     Physical Exam Updated Vital Signs BP (!) 184/86 (BP Location: Left Arm)    Pulse 65    Temp 98.1 F (36.7 C) (Oral)    Resp 20    Ht 1.829 m (6')    Wt 98.9 kg    SpO2 94%    BMI 29.57 kg/m   Physical Exam Vitals signs and nursing note reviewed.  Constitutional:      Appearance: Normal appearance. He is well-developed.  HENT:     Head: Atraumatic.     Nose: Nose normal.     Mouth/Throat:     Mouth: Mucous membranes are moist.     Pharynx: Oropharynx is clear.  Eyes:     General: No scleral icterus.    Conjunctiva/sclera: Conjunctivae normal.  Neck:     Musculoskeletal: Normal range of motion and neck supple. No neck rigidity.     Trachea: No tracheal deviation.  Cardiovascular:     Rate and Rhythm: Normal rate and regular rhythm.     Pulses: Normal pulses.     Heart sounds: Normal heart sounds. No murmur. No friction rub. No gallop.   Pulmonary:     Effort: Pulmonary effort is normal. No accessory muscle usage or respiratory distress.     Breath sounds: Normal breath sounds.  Abdominal:     General: Bowel sounds are normal. There is no distension.     Palpations: Abdomen is soft. There is no mass.     Tenderness: There is abdominal tenderness. There is no guarding or rebound.     Hernia: No hernia is present.     Comments: Mid abd tenderness.   Genitourinary:    Comments: No cva tenderness. No scrotal or testicular pain or tenderness.  Musculoskeletal:        General: No swelling.     Right lower leg: No edema.     Left lower leg: No edema.  Skin:     General: Skin is warm and dry.     Findings: No rash.  Neurological:     Mental Status: He is alert.     Comments: Alert, speech clear.   Psychiatric:        Mood and Affect: Mood normal.      ED Treatments / Results  Labs (all labs ordered are listed, but only abnormal results are displayed) Results for orders placed or performed  during the hospital encounter of 04/18/19  CBC  Result Value Ref Range   WBC 10.1 4.0 - 10.5 K/uL   RBC 5.05 4.22 - 5.81 MIL/uL   Hemoglobin 15.3 13.0 - 17.0 g/dL   HCT 46.0 39.0 - 52.0 %   MCV 91.1 80.0 - 100.0 fL   MCH 30.3 26.0 - 34.0 pg   MCHC 33.3 30.0 - 36.0 g/dL   RDW 13.5 11.5 - 15.5 %   Platelets 168 150 - 400 K/uL   nRBC 0.0 0.0 - 0.2 %  Comprehensive metabolic panel  Result Value Ref Range   Sodium 140 135 - 145 mmol/L   Potassium 3.8 3.5 - 5.1 mmol/L   Chloride 104 98 - 111 mmol/L   CO2 25 22 - 32 mmol/L   Glucose, Bld 145 (H) 70 - 99 mg/dL   BUN 25 (H) 8 - 23 mg/dL   Creatinine, Ser 1.01 0.61 - 1.24 mg/dL   Calcium 9.4 8.9 - 10.3 mg/dL   Total Protein 7.3 6.5 - 8.1 g/dL   Albumin 4.2 3.5 - 5.0 g/dL   AST 15 15 - 41 U/L   ALT 17 0 - 44 U/L   Alkaline Phosphatase 113 38 - 126 U/L   Total Bilirubin 0.7 0.3 - 1.2 mg/dL   GFR calc non Af Amer >60 >60 mL/min   GFR calc Af Amer >60 >60 mL/min   Anion gap 11 5 - 15  Lipase, blood  Result Value Ref Range   Lipase 27 11 - 51 U/L  Urinalysis, Routine w reflex microscopic  Result Value Ref Range   Color, Urine YELLOW YELLOW   APPearance CLEAR CLEAR   Specific Gravity, Urine >1.030 (H) 1.005 - 1.030   pH 6.0 5.0 - 8.0   Glucose, UA 100 (A) NEGATIVE mg/dL   Hgb urine dipstick NEGATIVE NEGATIVE   Bilirubin Urine NEGATIVE NEGATIVE   Ketones, ur NEGATIVE NEGATIVE mg/dL   Protein, ur NEGATIVE NEGATIVE mg/dL   Nitrite NEGATIVE NEGATIVE   Leukocytes,Ua NEGATIVE NEGATIVE   Ct Abdomen Pelvis W Contrast  Result Date: 04/19/2019 CLINICAL DATA:  Acute abdominal pain. EXAM: CT ABDOMEN  AND PELVIS WITH CONTRAST TECHNIQUE: Multidetector CT imaging of the abdomen and pelvis was performed using the standard protocol following bolus administration of intravenous contrast. CONTRAST:  166mL OMNIPAQUE IOHEXOL 300 MG/ML  SOLN COMPARISON:  Most recent CT 06/14/2018 FINDINGS: Lower chest: Mild bibasilar atelectasis. No confluent airspace disease. There are coronary artery calcifications. Hepatobiliary: No focal liver abnormality is seen. Few scattered calcified hepatic granuloma. No gallstones, gallbladder wall thickening, or biliary dilatation. Pancreas: No ductal dilatation or inflammation. Spleen: Normal in size without focal abnormality. Adrenals/Urinary Tract: No adrenal nodule. No hydronephrosis. Mild symmetric bilateral perinephric edema, similar to prior. Possible 7 mm stone in the distal right ureter, chronic and unchanged from prior. Adjacent punctate density. Symmetric excretion on delayed phase imaging. Unchanged subcentimeter low-density in the posterior left kidney, too small to characterize but likely small cyst. Urinary bladder is physiologically distended. No bladder wall thickening. Stomach/Bowel: Prior gastric fundoplication. Stomach mildly distended with fluid/ingested contents. No gastric wall thickening. Mildly dilated fluid-filled distal small bowel measuring up to 3 cm. There is fecalization of distal small bowel contents. Mild mesenteric edema. No discrete transition point, gradual tapering to the terminal ileum. Normal appendix. Moderate stool burden in the colon. No colonic wall thickening or inflammation. Vascular/Lymphatic: Aorto bi-iliac atherosclerosis. No aneurysm. Portal vein is patent. No enlarged lymph nodes in the abdomen or pelvis. Reproductive:  Heterogeneous enlarged prostate gland measuring 5.9 x 5.6 x 5.0 cm (volume = 86 cm^3). Seminal vesicle and vas deferens calcifications. Other: Mild mesenteric edema and minimal free fluid in the right mesentery. No free air or  intra-abdominal abscess. Musculoskeletal: There are no acute or suspicious osseous abnormalities. Degenerative disc disease at L5-S1 with minimal retrolisthesis of L5 on S1. Unchanged sclerotic density in the right iliac bone. IMPRESSION: 1. Mild fluid-filled small bowel dilatation as well as fecalization of small bowel contents. Mild associated mesenteric edema and free fluid. Appearance is similar to prior exams and suggest generalized slow transit/ileus. No transition point to suggest obstruction. 2. Possible nonobstructing distal right ureteric stone, unchanged from prior. No hydronephrosis. 3. Enlarged prostate gland. Aortic Atherosclerosis (ICD10-I70.0). Electronically Signed   By: Keith Rake M.D.   On: 04/19/2019 02:35    EKG None  Radiology Ct Abdomen Pelvis W Contrast  Result Date: 04/19/2019 CLINICAL DATA:  Acute abdominal pain. EXAM: CT ABDOMEN AND PELVIS WITH CONTRAST TECHNIQUE: Multidetector CT imaging of the abdomen and pelvis was performed using the standard protocol following bolus administration of intravenous contrast. CONTRAST:  195mL OMNIPAQUE IOHEXOL 300 MG/ML  SOLN COMPARISON:  Most recent CT 06/14/2018 FINDINGS: Lower chest: Mild bibasilar atelectasis. No confluent airspace disease. There are coronary artery calcifications. Hepatobiliary: No focal liver abnormality is seen. Few scattered calcified hepatic granuloma. No gallstones, gallbladder wall thickening, or biliary dilatation. Pancreas: No ductal dilatation or inflammation. Spleen: Normal in size without focal abnormality. Adrenals/Urinary Tract: No adrenal nodule. No hydronephrosis. Mild symmetric bilateral perinephric edema, similar to prior. Possible 7 mm stone in the distal right ureter, chronic and unchanged from prior. Adjacent punctate density. Symmetric excretion on delayed phase imaging. Unchanged subcentimeter low-density in the posterior left kidney, too small to characterize but likely small cyst. Urinary  bladder is physiologically distended. No bladder wall thickening. Stomach/Bowel: Prior gastric fundoplication. Stomach mildly distended with fluid/ingested contents. No gastric wall thickening. Mildly dilated fluid-filled distal small bowel measuring up to 3 cm. There is fecalization of distal small bowel contents. Mild mesenteric edema. No discrete transition point, gradual tapering to the terminal ileum. Normal appendix. Moderate stool burden in the colon. No colonic wall thickening or inflammation. Vascular/Lymphatic: Aorto bi-iliac atherosclerosis. No aneurysm. Portal vein is patent. No enlarged lymph nodes in the abdomen or pelvis. Reproductive: Heterogeneous enlarged prostate gland measuring 5.9 x 5.6 x 5.0 cm (volume = 86 cm^3). Seminal vesicle and vas deferens calcifications. Other: Mild mesenteric edema and minimal free fluid in the right mesentery. No free air or intra-abdominal abscess. Musculoskeletal: There are no acute or suspicious osseous abnormalities. Degenerative disc disease at L5-S1 with minimal retrolisthesis of L5 on S1. Unchanged sclerotic density in the right iliac bone. IMPRESSION: 1. Mild fluid-filled small bowel dilatation as well as fecalization of small bowel contents. Mild associated mesenteric edema and free fluid. Appearance is similar to prior exams and suggest generalized slow transit/ileus. No transition point to suggest obstruction. 2. Possible nonobstructing distal right ureteric stone, unchanged from prior. No hydronephrosis. 3. Enlarged prostate gland. Aortic Atherosclerosis (ICD10-I70.0). Electronically Signed   By: Keith Rake M.D.   On: 04/19/2019 02:35    Procedures Procedures (including critical care time)  Medications Ordered in ED Medications  sodium chloride 0.9 % bolus 1,000 mL (has no administration in time range)  ondansetron (ZOFRAN) injection 4 mg (has no administration in time range)     Initial Impression / Assessment and Plan / ED Course  I  have reviewed the triage vital  signs and the nursing notes.  Pertinent labs & imaging results that were available during my care of the patient were reviewed by me and considered in my medical decision making (see chart for details).  Iv ns. zofran iv. Labs sent. Imaging ordered. Pt requests pain medication. Morphine iv.   Reviewed nursing notes and prior charts for additional history.   Labs reviewed by me -  Wbc normal, lytes normal.  CT reviewed by me -  No signif dilated loops. Radiologist indicates ct c/w prior, no mech sbo.   Abd is soft, non tender, no vomiting. Vitals normal.  Discussed ct w pt - pt indicates sees GI, Dr Watt Climes - is encouraged to f/u there.  Return precautions provided.     Final Clinical Impressions(s) / ED Diagnoses   Final diagnoses:  None    ED Discharge Orders    None       Lajean Saver, MD 04/19/19 [phone]

## 2019-04-19 NOTE — ED Notes (Signed)
ED Provider at bedside. 

## 2019-04-19 NOTE — ED Notes (Signed)
Pt understood dc material. NAD noted. All questions answered to satisfaction. Pt escorted to check out counter.

## 2019-04-19 NOTE — ED Notes (Signed)
Patient aware we need urine sample.  

## 2019-04-19 NOTE — Discharge Instructions (Addendum)
It was our pleasure to provide your ER care today - we hope that you feel better.  Drink plenty of fluids. Get adequate fiber in diet.   Follow up with your doctor/GI doctor in the next 1-2 weeks.  Return to ER if worse, new symptoms, fevers, worsening or severe pain, persistent vomiting, other concern.  You were given pain medication in the ER - no driving for the next 4 hours.

## 2019-04-19 NOTE — ED Notes (Signed)
Patient transported to CT 

## 2019-05-10 ENCOUNTER — Other Ambulatory Visit: Payer: Self-pay | Admitting: Nurse Practitioner

## 2019-05-14 ENCOUNTER — Other Ambulatory Visit: Payer: Self-pay

## 2019-05-17 ENCOUNTER — Encounter: Payer: Self-pay | Admitting: Family Medicine

## 2019-05-17 ENCOUNTER — Ambulatory Visit (INDEPENDENT_AMBULATORY_CARE_PROVIDER_SITE_OTHER): Payer: BC Managed Care – PPO | Admitting: Family Medicine

## 2019-05-17 DIAGNOSIS — E782 Mixed hyperlipidemia: Secondary | ICD-10-CM

## 2019-05-17 DIAGNOSIS — E119 Type 2 diabetes mellitus without complications: Secondary | ICD-10-CM

## 2019-05-17 DIAGNOSIS — I10 Essential (primary) hypertension: Secondary | ICD-10-CM

## 2019-05-17 LAB — BAYER DCA HB A1C WAIVED: HB A1C (BAYER DCA - WAIVED): 6 % (ref <7.0)

## 2019-05-17 NOTE — Progress Notes (Signed)
Subjective:  Patient ID: [name],  male    DOB: 06-11-1951  Age: 68 y.o.    CC: No chief complaint on file.   HPI [name] presents for  follow-up of hypertension. Patient has no history of headache chest pain or shortness of breath or recent cough. Patient also denies symptoms of TIA such as numbness weakness lateralizing. Patient denies side effects from medication. States taking it regularly.120-140/60-75  Patient also  in for follow-up of elevated cholesterol. Doing well without complaints on current medication. Denies side effects  including myalgia and arthralgia and nausea. Also in today for liver function testing. Currently no chest pain, shortness of breath or other cardiovascular related symptoms noted.  Follow-up of diabetes. Patient does not check blood sugar at home. Patient denies symptoms such as excessive hunger or urinary frequency, excessive hunger, nausea No significant hypoglycemic spells noted. Medications reviewed. Pt reports taking them regularly. Pt. denies complication/adverse reaction today.    History [name] has a past medical history of Arthritis, Atypical small acinar proliferation of prostate, Benign paroxysmal positional vertigo, Diabetes mellitus without complication ([place]), Elevated PSA, Essential hypertension, benign, GERD (gastroesophageal reflux disease), History of basal cell carcinoma excision, History of hiatal hernia, History of kidney stones, History of melanoma excision, History of small bowel obstruction, Hyperlipidemia, LAFB (left anterior fascicular block), and Prediabetes.   He has a past surgical history that includes Lumbar disc surgery (x3   last one 1980's); Gastric fundoplication (3244'W); and Prostate biopsy (N/A, 11/21/2015).   His family history includes Diabetes in his father; Hypertension in his father and mother; Melanoma in his mother.He reports that he has never smoked. He has never used smokeless tobacco. He reports current  alcohol use. He reports that he does not use drugs.  Current Outpatient Medications on File Prior to Visit  Medication Sig Dispense Refill  . amLODipine (NORVASC) 5 MG tablet TAKE 1 & 1/2 TABLET IN THE MORNING.  Needs to be seen for further refills. 45 tablet 0  . aspirin 81 MG tablet Take 81 mg by mouth daily.    . Cholecalciferol (VITAMIN D) 2000 UNITS CAPS Take 2,000 capsules by mouth daily.     Marland Kitchen lisinopril (ZESTRIL) 20 MG tablet Take 1 tablet (20 mg total) by mouth daily. Needs to be seen for further refills 30 tablet 0  . metFORMIN (GLUCOPHAGE) 500 MG tablet TAKE (1) TABLET DAILY WITH BREAKFAST. Needs to be seen for further refills 30 tablet 0  . metoprolol succinate (TOPROL-XL) 100 MG 24 hr tablet Take with or immediately following a meal.  Needs to be seen for further refills 30 tablet 0  . pravastatin (PRAVACHOL) 20 MG tablet Take 1 tablet (20 mg total) by mouth daily. 90 tablet 3   No current facility-administered medications on file prior to visit.     ROS Review of Systems  Constitutional: Negative.   HENT: Negative.   Eyes: Negative for visual disturbance.  Respiratory: Negative for cough and shortness of breath.   Cardiovascular: Negative for chest pain and leg swelling.  Gastrointestinal: Negative for abdominal pain, diarrhea, nausea and vomiting.  Genitourinary: Negative for difficulty urinating.  Musculoskeletal: Negative for arthralgias and myalgias.  Skin: Negative for rash.  Neurological: Negative for headaches.  Psychiatric/Behavioral: Negative for sleep disturbance.    Objective:  There were no vitals taken for this visit.  BP Readings from Last 3 Encounters:  04/19/19 (!) 155/80  10/21/18 128/70  10/07/18 132/68    Wt Readings from Last 3 Encounters:  04/18/19 218 lb (98.9 kg)  10/07/18 241 lb (109.3 kg)  06/14/18 230 lb (104.3 kg)     Physical Exam  Exam deferred. Pt. Harboring due to COVID 19. Phone visit performed.     Assessment & Plan:    Diagnoses and all orders for this visit:  Hypertension -     CBC with Differential/Platelet -     CMP14+EGFR -     Bayer DCA Hb A1c Waived -     Lipid panel  Mixed hyperlipidemia -     CBC with Differential/Platelet -     CMP14+EGFR -     Bayer DCA Hb A1c Waived -     Lipid panel  Type 2 diabetes mellitus treated without insulin (HCC) -     CBC with Differential/Platelet -     CMP14+EGFR -     Bayer DCA Hb A1c Waived -     Lipid panel   I am having [name] maintain his Vitamin D, aspirin, pravastatin, lisinopril, metoprolol succinate, amLODipine, and metFORMIN.  Virtual Visit via telephone Note  I discussed the limitations, risks, security and privacy concerns of performing an evaluation and management service by telephone and the availability of in person appointments. I also discussed with the patient that there may be a patient responsible charge related to this service. The patient expressed understanding and agreed to proceed. Pt. Is at home. Dr. Livia Snellen is in his office.  Follow Up Instructions:   I discussed the assessment and treatment plan with the patient. The patient was provided an opportunity to ask questions and all were answered. The patient agreed with the plan and demonstrated an understanding of the instructions.   The patient was advised to call back or seek an in-person evaluation if the symptoms worsen or if the condition fails to improve as anticipated.  Total minutes including chart review and phone contact time: 24    Follow-up: Return in about 3 months (around 08/17/2019).  Claretta Fraise, M.D.

## 2019-05-18 LAB — CBC WITH DIFFERENTIAL/PLATELET
Basophils Absolute: 0.1 10*3/uL (ref 0.0–0.2)
Basos: 1 %
EOS (ABSOLUTE): 0.1 10*3/uL (ref 0.0–0.4)
Eos: 1 %
Hematocrit: 43.4 % (ref 37.5–51.0)
Hemoglobin: 15.2 g/dL (ref 13.0–17.7)
Immature Grans (Abs): 0 10*3/uL (ref 0.0–0.1)
Immature Granulocytes: 0 %
Lymphocytes Absolute: 1.6 10*3/uL (ref 0.7–3.1)
Lymphs: 25 %
MCH: 30.6 pg (ref 26.6–33.0)
MCHC: 35 g/dL (ref 31.5–35.7)
MCV: 87 fL (ref 79–97)
Monocytes Absolute: 0.5 10*3/uL (ref 0.1–0.9)
Monocytes: 8 %
Neutrophils Absolute: 4.1 10*3/uL (ref 1.4–7.0)
Neutrophils: 65 %
Platelets: 179 10*3/uL (ref 150–450)
RBC: 4.97 x10E6/uL (ref 4.14–5.80)
RDW: 12.9 % (ref 11.6–15.4)
WBC: 6.3 10*3/uL (ref 3.4–10.8)

## 2019-05-18 LAB — CMP14+EGFR
ALT: 18 IU/L (ref 0–44)
AST: 11 IU/L (ref 0–40)
Albumin/Globulin Ratio: 1.8 (ref 1.2–2.2)
Albumin: 4.4 g/dL (ref 3.8–4.8)
Alkaline Phosphatase: 136 IU/L — ABNORMAL HIGH (ref 39–117)
BUN/Creatinine Ratio: 20 (ref 10–24)
BUN: 20 mg/dL (ref 8–27)
Bilirubin Total: 0.4 mg/dL (ref 0.0–1.2)
CO2: 24 mmol/L (ref 20–29)
Calcium: 9.5 mg/dL (ref 8.6–10.2)
Chloride: 103 mmol/L (ref 96–106)
Creatinine, Ser: 0.99 mg/dL (ref 0.76–1.27)
GFR calc Af Amer: 90 mL/min/{1.73_m2} (ref >59)
GFR calc non Af Amer: 78 mL/min/{1.73_m2} (ref >59)
Globulin, Total: 2.5 g/dL (ref 1.5–4.5)
Glucose: 131 mg/dL — ABNORMAL HIGH (ref 65–99)
Potassium: 3.7 mmol/L (ref 3.5–5.2)
Sodium: 141 mmol/L (ref 134–144)
Total Protein: 6.9 g/dL (ref 6.0–8.5)

## 2019-05-18 LAB — LIPID PANEL
Chol/HDL Ratio: 2.3 ratio (ref 0.0–5.0)
Cholesterol, Total: 123 mg/dL (ref 100–199)
HDL: 54 mg/dL (ref >39)
LDL Calculated: 53 mg/dL (ref 0–99)
Triglycerides: 81 mg/dL (ref 0–149)
VLDL Cholesterol Cal: 16 mg/dL (ref 5–40)

## 2019-05-18 NOTE — Progress Notes (Signed)
Hello Duran,  Your lab result is normal and/or stable.Some minor variations that are not significant are commonly marked abnormal, but do not represent any medical problem for you.  Best regards, Kanda Deluna, M.D.

## 2019-06-08 ENCOUNTER — Other Ambulatory Visit: Payer: Self-pay | Admitting: Nurse Practitioner

## 2019-06-10 ENCOUNTER — Other Ambulatory Visit: Payer: Self-pay | Admitting: Nurse Practitioner

## 2019-06-11 ENCOUNTER — Encounter: Payer: Self-pay | Admitting: *Deleted

## 2019-06-11 ENCOUNTER — Telehealth: Payer: Self-pay | Admitting: Family Medicine

## 2019-06-11 MED ORDER — METFORMIN HCL 500 MG PO TABS: ORAL_TABLET | ORAL | 1 refills | Status: DC

## 2019-06-11 NOTE — Telephone Encounter (Signed)
Former [place] 30 days given 05/10/19

## 2019-06-11 NOTE — Telephone Encounter (Signed)
Filled for 6 months per protocol

## 2019-06-28 ENCOUNTER — Telehealth: Payer: Self-pay | Admitting: Family Medicine

## 2019-06-28 MED ORDER — AMLODIPINE BESYLATE 5 MG PO TABS: ORAL_TABLET | ORAL | 1 refills | Status: DC

## 2019-06-28 NOTE — Telephone Encounter (Signed)
6 month supply sent per protocol

## 2019-10-11 DIAGNOSIS — L570 Actinic keratosis: Secondary | ICD-10-CM | POA: Diagnosis not present

## 2019-10-11 DIAGNOSIS — Z08 Encounter for follow-up examination after completed treatment for malignant neoplasm: Secondary | ICD-10-CM | POA: Diagnosis not present

## 2019-10-11 DIAGNOSIS — Z1283 Encounter for screening for malignant neoplasm of skin: Secondary | ICD-10-CM | POA: Diagnosis not present

## 2019-10-11 DIAGNOSIS — D225 Melanocytic nevi of trunk: Secondary | ICD-10-CM | POA: Diagnosis not present

## 2019-10-11 DIAGNOSIS — L720 Epidermal cyst: Secondary | ICD-10-CM | POA: Diagnosis not present

## 2019-10-11 DIAGNOSIS — Z85820 Personal history of malignant melanoma of skin: Secondary | ICD-10-CM | POA: Diagnosis not present

## 2019-10-12 ENCOUNTER — Telehealth: Payer: Self-pay | Admitting: Family Medicine

## 2019-10-12 ENCOUNTER — Other Ambulatory Visit: Payer: Self-pay | Admitting: Pharmacist Clinician (PhC)/ Clinical Pharmacy Specialist

## 2019-10-12 NOTE — Telephone Encounter (Signed)
Former [place] by new provider

## 2019-10-12 NOTE — Telephone Encounter (Signed)
What is the name of the medication? Pravastatin  Have you contacted your pharmacy to request a refill? Yes  Which pharmacy would you like this sent to? [facility]  Pt made med refill/follow up appt with Dr Livia Snellen.  Patient notified that their request is being sent to the clinical staff for review and that they should receive a call once it is complete. If they do not receive a call within 24 hours they can check with their pharmacy or our office.

## 2019-10-13 MED ORDER — PRAVASTATIN SODIUM 20 MG PO TABS: 20.0000 mg | ORAL_TABLET | Freq: Every day | ORAL | 3 refills | Status: DC

## 2019-10-13 NOTE — Telephone Encounter (Signed)
Pravastatin sent to pharmacy

## 2019-10-19 ENCOUNTER — Other Ambulatory Visit: Payer: Self-pay | Admitting: *Deleted

## 2019-10-19 MED ORDER — LISINOPRIL 20 MG PO TABS: 20.0000 mg | ORAL_TABLET | Freq: Every day | ORAL | 0 refills | Status: DC

## 2019-10-26 ENCOUNTER — Other Ambulatory Visit: Payer: Self-pay

## 2019-10-27 ENCOUNTER — Ambulatory Visit (INDEPENDENT_AMBULATORY_CARE_PROVIDER_SITE_OTHER): Payer: Medicare Other | Admitting: Family Medicine

## 2019-10-27 ENCOUNTER — Encounter: Payer: Self-pay | Admitting: Family Medicine

## 2019-10-27 VITALS — BP 135/79 | HR 64 | Temp 97.8°F | Ht 72.0 in | Wt 227.0 lb

## 2019-10-27 DIAGNOSIS — I10 Essential (primary) hypertension: Secondary | ICD-10-CM

## 2019-10-27 DIAGNOSIS — E782 Mixed hyperlipidemia: Secondary | ICD-10-CM

## 2019-10-27 DIAGNOSIS — E559 Vitamin D deficiency, unspecified: Secondary | ICD-10-CM | POA: Diagnosis not present

## 2019-10-27 DIAGNOSIS — E118 Type 2 diabetes mellitus with unspecified complications: Secondary | ICD-10-CM | POA: Diagnosis not present

## 2019-10-27 DIAGNOSIS — R972 Elevated prostate specific antigen [PSA]: Secondary | ICD-10-CM

## 2019-10-27 LAB — BAYER DCA HB A1C WAIVED: HB A1C (BAYER DCA - WAIVED): 6.3 % (ref <7.0)

## 2019-10-27 MED ORDER — LISINOPRIL 20 MG PO TABS: 20.0000 mg | ORAL_TABLET | Freq: Every day | ORAL | 0 refills | Status: DC

## 2019-10-27 MED ORDER — BLOOD GLUCOSE MONITOR KIT: PACK | 0 refills | Status: DC

## 2019-10-27 MED ORDER — AMLODIPINE BESYLATE 5 MG PO TABS: ORAL_TABLET | ORAL | 1 refills | Status: DC

## 2019-10-27 MED ORDER — PRAVASTATIN SODIUM 20 MG PO TABS: 20.0000 mg | ORAL_TABLET | Freq: Every day | ORAL | 3 refills | Status: DC

## 2019-10-27 MED ORDER — METFORMIN HCL 500 MG PO TABS: ORAL_TABLET | ORAL | 1 refills | Status: DC

## 2019-10-27 MED ORDER — METOPROLOL SUCCINATE ER 100 MG PO TB24: ORAL_TABLET | ORAL | 5 refills | Status: DC

## 2019-10-27 NOTE — Progress Notes (Signed)
Subjective:  Patient ID: [name],  male    DOB: [birth year]  Age: 69 y.o.    CC: Medical Management of Chronic Issues (6 month follow up )   HPI [name] presents for  follow-up of hypertension. Patient has no history of headache chest pain or shortness of breath or recent cough. Patient also denies symptoms of TIA such as numbness weakness lateralizing. Patient denies side effects from medication. States taking it regularly.  Patient also  in for follow-up of  cholesterol. Doing well without complaints on current medication. Denies side effects  including myalgia and arthralgia and nausea. Also in today for liver function testing. Currently no chest pain, shortness of breath or other cardiovascular related symptoms noted.  Follow-up of diabetes. Patient does not check blood sugar at home.  Patient denies symptoms such as excessive hunger or urinary frequency, excessive hunger, nausea No significant hypoglycemic spells noted. Medications reviewed. Pt reports taking them regularly. Pt. denies complication/adverse reaction today.    History [name] has a past medical history of Arthritis, Atypical small acinar proliferation of prostate, Benign paroxysmal positional vertigo, Diabetes mellitus without complication ([place]), Elevated PSA, Essential hypertension, benign, GERD (gastroesophageal reflux disease), History of basal cell carcinoma excision, History of hiatal hernia, History of kidney stones, History of melanoma excision, History of small bowel obstruction, Hyperlipidemia, LAFB (left anterior fascicular block), and Prediabetes.   He has a past surgical history that includes Lumbar disc surgery (x3   last one 1980's); Gastric fundoplication (5374'M); and Prostate biopsy (N/A, 11/21/2015).   His family history includes Diabetes in his father; Hypertension in his father and mother; Melanoma in his mother.He reports that he has never smoked. He has never used smokeless tobacco. He reports  current alcohol use. He reports that he does not use drugs.  Current Outpatient Medications on File Prior to Visit  Medication Sig Dispense Refill  . aspirin 81 MG tablet Take 81 mg by mouth daily.    . Cholecalciferol (VITAMIN D) 2000 UNITS CAPS Take 2,000 capsules by mouth daily.      No current facility-administered medications on file prior to visit.    ROS Review of Systems  Constitutional: Negative for fever.  Respiratory: Negative for shortness of breath.   Cardiovascular: Negative for chest pain.  Musculoskeletal: Negative for arthralgias.  Skin: Negative for rash.    Objective:  BP 135/79   Pulse 64   Temp 97.8 F (36.6 C) (Temporal)   Ht 6' (1.829 m)   Wt 227 lb (103 kg)   SpO2 97%   BMI 30.79 kg/m   BP Readings from Last 3 Encounters:  10/27/19 135/79  04/19/19 (!) 155/80  10/21/18 128/70    Wt Readings from Last 3 Encounters:  10/27/19 227 lb (103 kg)  04/18/19 218 lb (98.9 kg)  10/07/18 241 lb (109.3 kg)     Physical Exam Vitals reviewed.  Constitutional:      Appearance: He is well-developed.  HENT:     Head: Normocephalic and atraumatic.     Right Ear: Tympanic membrane and external ear normal. No decreased hearing noted.     Left Ear: Tympanic membrane and external ear normal. No decreased hearing noted.     Mouth/Throat:     Pharynx: No oropharyngeal exudate or posterior oropharyngeal erythema.  Eyes:     Pupils: Pupils are equal, round, and reactive to light.  Cardiovascular:     Rate and Rhythm: Normal rate and regular rhythm.     Heart sounds:  No murmur.  Pulmonary:     Effort: No respiratory distress.     Breath sounds: Normal breath sounds.  Abdominal:     General: Bowel sounds are normal.     Palpations: Abdomen is soft. There is no mass.     Tenderness: There is no abdominal tenderness.  Musculoskeletal:     Cervical back: Normal range of motion and neck supple.     Diabetic Foot Exam - Simple   No data filed         Assessment & Plan:   [name] was seen today for medical management of chronic issues.  Diagnoses and all orders for this visit:  Vitamin D deficiency -     CBC with Differential/Platelet -     CMP14+EGFR -     VITAMIN D 25 Hydroxy (Vit-D Deficiency, Fractures)  Elevated PSA -     PSA, total and free -     CBC with Differential/Platelet -     CMP14+EGFR  Hypertension -     CBC with Differential/Platelet -     CMP14+EGFR -     amLODipine (NORVASC) 5 MG tablet; TAKE 1 & 1/2 TABLET IN THE MORNING. -     lisinopril (ZESTRIL) 20 MG tablet; Take 1 tablet (20 mg total) by mouth daily. -     metoprolol succinate (TOPROL-XL) 100 MG 24 hr tablet; TAKE 1 TABLET EVERY DAY WITH OR IMMEDIATELY FOLLOWING A MEAL  Mixed hyperlipidemia -     CBC with Differential/Platelet -     CMP14+EGFR -     pravastatin (PRAVACHOL) 20 MG tablet; Take 1 tablet (20 mg total) by mouth daily.  Controlled type 2 diabetes mellitus with complication, without long-term current use of insulin (HCC) -     CBC with Differential/Platelet -     CMP14+EGFR -     Bayer DCA Hb A1c Waived -     lisinopril (ZESTRIL) 20 MG tablet; Take 1 tablet (20 mg total) by mouth daily. -     pravastatin (PRAVACHOL) 20 MG tablet; Take 1 tablet (20 mg total) by mouth daily. -     blood glucose meter kit and supplies KIT; Dispense based on patient and insurance preference. Use up to four times daily as directed. (FOR ICD-10 : E11.9  Other orders -     metFORMIN (GLUCOPHAGE) 500 MG tablet; TAKE (1) TABLET DAILY WITH BREAKFAST.   I am having [name] start on blood glucose meter kit and supplies. I am also having him maintain his Vitamin D, aspirin, amLODipine, lisinopril, metFORMIN, metoprolol succinate, and pravastatin.  Meds ordered this encounter  Medications  . amLODipine (NORVASC) 5 MG tablet    Sig: TAKE 1 & 1/2 TABLET IN THE MORNING.    Dispense:  135 tablet    Refill:  1  . lisinopril (ZESTRIL) 20 MG tablet    Sig:  Take 1 tablet (20 mg total) by mouth daily.    Dispense:  90 tablet    Refill:  0  . metFORMIN (GLUCOPHAGE) 500 MG tablet    Sig: TAKE (1) TABLET DAILY WITH BREAKFAST.    Dispense:  90 tablet    Refill:  1  . metoprolol succinate (TOPROL-XL) 100 MG 24 hr tablet    Sig: TAKE 1 TABLET EVERY DAY WITH OR IMMEDIATELY FOLLOWING A MEAL    Dispense:  30 tablet    Refill:  5  . pravastatin (PRAVACHOL) 20 MG tablet    Sig: Take 1 tablet (20 mg total)  by mouth daily.    Dispense:  90 tablet    Refill:  3  . blood glucose meter kit and supplies KIT    Sig: Dispense based on patient and insurance preference. Use up to four times daily as directed. (FOR ICD-10 : E11.9    Dispense:  1 each    Refill:  0    Order Specific Question:   Number of strips    Answer:   100    Order Specific Question:   Number of lancets    Answer:   100   Patient's blood pressure looks stable today.  He continues to take his medications as listed above without noted adverse reactions.  We discussed several of the major possibilities of each.  His 3 major diagnoses, hypertension, diabetes, hypercholesterolemia are checked today and are stable.  Patient has questions about monitoring his glucose at home.  We discussed the need to do that on a regular basis.  His A1c tends to be good with minimal effort.  However, it did go up to 7 several months ago.  He was given a glucose log sheet to check twice daily.  New prescription as above for glucose meter kit.  He seems uncomfortable with follow-up every 3 months and for now with his A1c doing well I do not want to push him too hard so that he completely drops out.  Therefore once every 6 months for now.  With time will move toward 12-monthcheckups.  Follow-up: Return in about 6 months (around 04/25/2020).  WClaretta Fraise M.D.

## 2019-10-28 LAB — CBC WITH DIFFERENTIAL/PLATELET
Basophils Absolute: 0.1 10*3/uL (ref 0.0–0.2)
Basos: 1 %
EOS (ABSOLUTE): 0.1 10*3/uL (ref 0.0–0.4)
Eos: 2 %
Hematocrit: 47.6 % (ref 37.5–51.0)
Hemoglobin: 16.3 g/dL (ref 13.0–17.7)
Immature Grans (Abs): 0 10*3/uL (ref 0.0–0.1)
Immature Granulocytes: 0 %
Lymphocytes Absolute: 1.6 10*3/uL (ref 0.7–3.1)
Lymphs: 27 %
MCH: 31.3 pg (ref 26.6–33.0)
MCHC: 34.2 g/dL (ref 31.5–35.7)
MCV: 91 fL (ref 79–97)
Monocytes Absolute: 0.4 10*3/uL (ref 0.1–0.9)
Monocytes: 6 %
Neutrophils Absolute: 3.7 10*3/uL (ref 1.4–7.0)
Neutrophils: 64 %
Platelets: 201 10*3/uL (ref 150–450)
RBC: 5.21 x10E6/uL (ref 4.14–5.80)
RDW: 13 % (ref 11.6–15.4)
WBC: 5.7 10*3/uL (ref 3.4–10.8)

## 2019-10-28 LAB — CMP14+EGFR
ALT: 16 IU/L (ref 0–44)
AST: 15 IU/L (ref 0–40)
Albumin/Globulin Ratio: 1.6 (ref 1.2–2.2)
Albumin: 4.3 g/dL (ref 3.8–4.8)
Alkaline Phosphatase: 130 IU/L — ABNORMAL HIGH (ref 39–117)
BUN/Creatinine Ratio: 14 (ref 10–24)
BUN: 12 mg/dL (ref 8–27)
Bilirubin Total: 0.5 mg/dL (ref 0.0–1.2)
CO2: 25 mmol/L (ref 20–29)
Calcium: 9.5 mg/dL (ref 8.6–10.2)
Chloride: 102 mmol/L (ref 96–106)
Creatinine, Ser: 0.87 mg/dL (ref 0.76–1.27)
GFR calc Af Amer: 102 mL/min/{1.73_m2} (ref >59)
GFR calc non Af Amer: 88 mL/min/{1.73_m2} (ref >59)
Globulin, Total: 2.7 g/dL (ref 1.5–4.5)
Glucose: 130 mg/dL — ABNORMAL HIGH (ref 65–99)
Potassium: 3.8 mmol/L (ref 3.5–5.2)
Sodium: 141 mmol/L (ref 134–144)
Total Protein: 7 g/dL (ref 6.0–8.5)

## 2019-10-28 LAB — PSA, TOTAL AND FREE
PSA, Free Pct: 23.1 %
PSA, Free: 1.34 ng/mL
Prostate Specific Ag, Serum: 5.8 ng/mL — ABNORMAL HIGH (ref 0.0–4.0)

## 2019-10-28 LAB — VITAMIN D 25 HYDROXY (VIT D DEFICIENCY, FRACTURES): Vit D, 25-Hydroxy: 42.6 ng/mL (ref 30.0–100.0)

## 2019-12-03 DIAGNOSIS — R350 Frequency of micturition: Secondary | ICD-10-CM | POA: Diagnosis not present

## 2019-12-15 ENCOUNTER — Other Ambulatory Visit: Payer: Self-pay | Admitting: Family Medicine

## 2019-12-15 ENCOUNTER — Telehealth: Payer: Self-pay | Admitting: Family Medicine

## 2019-12-15 DIAGNOSIS — E118 Type 2 diabetes mellitus with unspecified complications: Secondary | ICD-10-CM

## 2019-12-15 MED ORDER — GLUCOSE BLOOD VI STRP: ORAL_STRIP | 12 refills | Status: DC

## 2019-12-15 NOTE — Telephone Encounter (Signed)
Patient has already dropped off and would like a call on what he needs to do after you review his readings.

## 2019-12-15 NOTE — Telephone Encounter (Signed)
Please continue meds as is. Continue to check glucose. Strips scrip written. May have to fax. WS

## 2019-12-16 NOTE — Telephone Encounter (Signed)
Pt informed. He will continue to monitor his glucose. Strips sent to [name] 12/15/19

## 2019-12-21 DIAGNOSIS — L820 Inflamed seborrheic keratosis: Secondary | ICD-10-CM | POA: Diagnosis not present

## 2019-12-21 DIAGNOSIS — X32XXXD Exposure to sunlight, subsequent encounter: Secondary | ICD-10-CM | POA: Diagnosis not present

## 2019-12-21 DIAGNOSIS — L570 Actinic keratosis: Secondary | ICD-10-CM | POA: Diagnosis not present

## 2020-02-08 ENCOUNTER — Telehealth: Payer: Self-pay | Admitting: Family Medicine

## 2020-02-08 NOTE — Telephone Encounter (Signed)
Called and informed patient that Dr. Livia Snellen has been out of the office until today 02/08/20.  Patient verbalized understanding and states he would like a call back if needed once blood sugar log has been reviewed

## 2020-02-08 NOTE — Telephone Encounter (Signed)
Overall they look good. However they should be under 125 fasting and he has several in the 125-150 range.  It is time for him to come in for a diabetes check anyway.

## 2020-02-09 ENCOUNTER — Telehealth: Payer: Self-pay | Admitting: Family Medicine

## 2020-02-09 NOTE — Telephone Encounter (Signed)
See previous telephone encounter.

## 2020-02-09 NOTE — Telephone Encounter (Signed)
Patient aware and has 6 month f/u scheduled for 03/2020

## 2020-04-17 ENCOUNTER — Encounter: Payer: Self-pay | Admitting: Family Medicine

## 2020-04-17 ENCOUNTER — Other Ambulatory Visit: Payer: Self-pay

## 2020-04-17 ENCOUNTER — Ambulatory Visit (INDEPENDENT_AMBULATORY_CARE_PROVIDER_SITE_OTHER): Payer: Medicare Other | Admitting: Family Medicine

## 2020-04-17 VITALS — BP 157/82 | HR 63 | Temp 98.4°F | Resp 20 | Ht 72.0 in | Wt 232.5 lb

## 2020-04-17 DIAGNOSIS — E291 Testicular hypofunction: Secondary | ICD-10-CM | POA: Diagnosis not present

## 2020-04-17 DIAGNOSIS — R7303 Prediabetes: Secondary | ICD-10-CM | POA: Diagnosis not present

## 2020-04-17 DIAGNOSIS — I10 Essential (primary) hypertension: Secondary | ICD-10-CM | POA: Diagnosis not present

## 2020-04-17 DIAGNOSIS — E118 Type 2 diabetes mellitus with unspecified complications: Secondary | ICD-10-CM | POA: Diagnosis not present

## 2020-04-17 DIAGNOSIS — E782 Mixed hyperlipidemia: Secondary | ICD-10-CM

## 2020-04-17 DIAGNOSIS — R972 Elevated prostate specific antigen [PSA]: Secondary | ICD-10-CM

## 2020-04-17 LAB — BAYER DCA HB A1C WAIVED: HB A1C (BAYER DCA - WAIVED): 6.2 % (ref <7.0)

## 2020-04-17 NOTE — Progress Notes (Signed)
Subjective:  Patient ID: [name], male    DOB: 1950/12/01  Age: 69 y.o. MRN: [id number]  CC: Medical Management of Chronic Issues   HPI [name] presents for  follow-up of hypertension. Patient has no history of headache chest pain or shortness of breath or recent cough. Patient also denies symptoms of TIA such as focal numbness or weakness. Patient denies side effects from medication. States taking it regularly.  Patient tells me his home blood pressure readings run around 1 24-4 30 systolic and in the 62M and 63O diastolic.  Patient in for follow-up of elevated cholesterol. Doing well without complaints on current medication. Denies side effects of statin including myalgia and arthralgia and nausea. Also in today for liver function testing. Currently no chest pain, shortness of breath or other cardiovascular related symptoms noted.  Patient is also followed for prediabetes.  He is taking Metformin regularly.  He is following a low carbohydrate diet.  No low sugar reactions.  History [name] has a past medical history of Arthritis, Atypical small acinar proliferation of prostate, Benign paroxysmal positional vertigo, Diabetes mellitus without complication ([place]), Elevated PSA, Essential hypertension, benign, GERD (gastroesophageal reflux disease), History of basal cell carcinoma excision, History of hiatal hernia, History of kidney stones, History of melanoma excision, History of small bowel obstruction, Hyperlipidemia, LAFB (left anterior fascicular block), and Prediabetes.   He has a past surgical history that includes Lumbar disc surgery (x3   last one 1980's); Gastric fundoplication (1771'H); and Prostate biopsy (N/A, 11/21/2015).   His family history includes Diabetes in his father; Hypertension in his father and mother; Melanoma in his mother.He reports that he has never smoked. He has never used smokeless tobacco. He reports current alcohol use. He reports that he does not use  drugs.  Current Outpatient Medications on File Prior to Visit  Medication Sig Dispense Refill  . amLODipine (NORVASC) 5 MG tablet TAKE 1 & 1/2 TABLET IN THE MORNING. 135 tablet 1  . aspirin 81 MG tablet Take 81 mg by mouth daily.    . blood glucose meter kit and supplies KIT Dispense based on patient and insurance preference. Use up to four times daily as directed. (FOR ICD-10 : E11.9 1 each 0  . glucose blood test strip Use as instructed 100 each 12  . lisinopril (ZESTRIL) 20 MG tablet Take 1 tablet (20 mg total) by mouth daily. 90 tablet 0  . metFORMIN (GLUCOPHAGE) 500 MG tablet TAKE (1) TABLET DAILY WITH BREAKFAST. 90 tablet 1  . metoprolol succinate (TOPROL-XL) 100 MG 24 hr tablet TAKE 1 TABLET EVERY DAY WITH OR IMMEDIATELY FOLLOWING A MEAL 30 tablet 5  . pravastatin (PRAVACHOL) 20 MG tablet Take 1 tablet (20 mg total) by mouth daily. 90 tablet 3  . Cholecalciferol (VITAMIN D) 2000 UNITS CAPS Take 1 capsule by mouth daily.      No current facility-administered medications on file prior to visit.    ROS Review of Systems  Constitutional: Negative.   HENT: Negative.   Eyes: Negative for visual disturbance.  Respiratory: Negative for cough and shortness of breath.   Cardiovascular: Negative for chest pain and leg swelling.  Gastrointestinal: Negative for abdominal pain, diarrhea, nausea and vomiting.  Genitourinary: Negative for difficulty urinating.  Musculoskeletal: Negative for arthralgias and myalgias.  Skin: Negative for rash.  Neurological: Negative for headaches.  Psychiatric/Behavioral: Negative for sleep disturbance.    Objective:  BP (!) 157/82   Pulse 63   Temp 98.4 F (36.9 C) (Temporal)  Resp 20   Ht 6' (1.829 m)   Wt 232 lb 8 oz (105.5 kg)   SpO2 96%   BMI 31.53 kg/m   BP Readings from Last 3 Encounters:  04/17/20 (!) 157/82  10/27/19 135/79  04/19/19 (!) 155/80    Wt Readings from Last 3 Encounters:  04/17/20 232 lb 8 oz (105.5 kg)  10/27/19 227 lb  (103 kg)  04/18/19 218 lb (98.9 kg)     Physical Exam Constitutional:      General: He is not in acute distress.    Appearance: He is well-developed.  HENT:     Head: Normocephalic and atraumatic.     Right Ear: External ear normal.     Left Ear: External ear normal.     Nose: Nose normal.  Eyes:     Conjunctiva/sclera: Conjunctivae normal.     Pupils: Pupils are equal, round, and reactive to light.  Cardiovascular:     Rate and Rhythm: Normal rate and regular rhythm.     Heart sounds: Normal heart sounds. No murmur heard.   Pulmonary:     Effort: Pulmonary effort is normal. No respiratory distress.     Breath sounds: Normal breath sounds. No wheezing or rales.  Abdominal:     Palpations: Abdomen is soft.     Tenderness: There is no abdominal tenderness.  Musculoskeletal:        General: Normal range of motion.     Cervical back: Normal range of motion and neck supple.  Skin:    General: Skin is warm and dry.  Neurological:     Mental Status: He is alert and oriented to person, place, and time.     Deep Tendon Reflexes: Reflexes are normal and symmetric.  Psychiatric:        Behavior: Behavior normal.        Thought Content: Thought content normal.        Judgment: Judgment normal.       Assessment & Plan:   [name] was seen today for medical management of chronic issues.  Diagnoses and all orders for this visit:  Controlled type 2 diabetes mellitus with complication, without long-term current use of insulin (HCC) -     Bayer DCA Hb A1c Waived -     Microalbumin / creatinine urine ratio -     CBC with Differential/Platelet -     CMP14+EGFR -     Lipid panel  Essential hypertension, benign -     CBC with Differential/Platelet -     CMP14+EGFR -     Lipid panel  Mixed hyperlipidemia -     CBC with Differential/Platelet -     CMP14+EGFR -     Lipid panel  Hypogonadism in male -     Testosterone,Free and Total  Pre-diabetes  Elevated PSA -     PSA  Total (Reflex To Free)   Allergies as of 04/17/2020      Reactions   Penicillins Itching, Rash   Has patient had a PCN reaction causing immediate rash, facial/tongue/throat swelling, SOB or lightheadedness with hypotension: Yes Has patient had a PCN reaction causing severe rash involving mucus membranes or skin necrosis: No Has patient had a PCN reaction that required hospitalization: No Has patient had a PCN reaction occurring within the last 10 years: No If all of the above answers are "NO", then may proceed with Cephalosporin use.      Medication List       Accurate as  of April 17, 2020  8:50 AM. If you have any questions, ask your nurse or doctor.        amLODipine 5 MG tablet Commonly known as: NORVASC TAKE 1 & 1/2 TABLET IN THE MORNING.   aspirin 81 MG tablet Take 81 mg by mouth daily.   blood glucose meter kit and supplies Kit Dispense based on patient and insurance preference. Use up to four times daily as directed. (FOR ICD-10 : E11.9   glucose blood test strip Use as instructed   lisinopril 20 MG tablet Commonly known as: ZESTRIL Take 1 tablet (20 mg total) by mouth daily.   metFORMIN 500 MG tablet Commonly known as: GLUCOPHAGE TAKE (1) TABLET DAILY WITH BREAKFAST.   metoprolol succinate 100 MG 24 hr tablet Commonly known as: TOPROL-XL TAKE 1 TABLET EVERY DAY WITH OR IMMEDIATELY FOLLOWING A MEAL   pravastatin 20 MG tablet Commonly known as: PRAVACHOL Take 1 tablet (20 mg total) by mouth daily.   Vitamin D 50 MCG (2000 UT) Caps Take 1 capsule by mouth daily.       No orders of the defined types were placed in this encounter.   Patient is checking blood pressure at home and readings there are adequate therefore he was advised to write down his blood pressure taken 3 times back to back morning and afternoon while at rest.  He should do that for a week and submit those numbers and that way we can have a good idea of is asked her blood pressure and best  course of treatment.  Follow-up: No follow-ups on file.  Claretta Fraise, M.D.

## 2020-04-17 NOTE — Patient Instructions (Signed)
Check your blood pressure 3 times about a minute apart, back to back in the morning and again in the afternoon for 1 week.  Write them down and return them to the office so that I can give you the my best assessment where we should go with your blood pressure treatment.  Make sure that you are rested and relaxed when you take these measurements.

## 2020-04-18 LAB — CBC WITH DIFFERENTIAL/PLATELET
Basophils Absolute: 0.1 10*3/uL (ref 0.0–0.2)
Basos: 1 %
EOS (ABSOLUTE): 0.1 10*3/uL (ref 0.0–0.4)
Eos: 2 %
Hematocrit: 44.8 % (ref 37.5–51.0)
Hemoglobin: 15.2 g/dL (ref 13.0–17.7)
Immature Grans (Abs): 0 10*3/uL (ref 0.0–0.1)
Immature Granulocytes: 0 %
Lymphocytes Absolute: 1.6 10*3/uL (ref 0.7–3.1)
Lymphs: 26 %
MCH: 30.8 pg (ref 26.6–33.0)
MCHC: 33.9 g/dL (ref 31.5–35.7)
MCV: 91 fL (ref 79–97)
Monocytes Absolute: 0.5 10*3/uL (ref 0.1–0.9)
Monocytes: 8 %
Neutrophils Absolute: 4 10*3/uL (ref 1.4–7.0)
Neutrophils: 63 %
Platelets: 178 10*3/uL (ref 150–450)
RBC: 4.94 x10E6/uL (ref 4.14–5.80)
RDW: 13.4 % (ref 11.6–15.4)
WBC: 6.3 10*3/uL (ref 3.4–10.8)

## 2020-04-18 LAB — CMP14+EGFR
ALT: 15 IU/L (ref 0–44)
AST: 10 IU/L (ref 0–40)
Albumin/Globulin Ratio: 1.4 (ref 1.2–2.2)
Albumin: 4.2 g/dL (ref 3.8–4.8)
Alkaline Phosphatase: 145 IU/L — ABNORMAL HIGH (ref 48–121)
BUN/Creatinine Ratio: 25 — ABNORMAL HIGH (ref 10–24)
BUN: 26 mg/dL (ref 8–27)
Bilirubin Total: 0.5 mg/dL (ref 0.0–1.2)
CO2: 25 mmol/L (ref 20–29)
Calcium: 9.5 mg/dL (ref 8.6–10.2)
Chloride: 103 mmol/L (ref 96–106)
Creatinine, Ser: 1.02 mg/dL (ref 0.76–1.27)
GFR calc Af Amer: 86 mL/min/{1.73_m2} (ref >59)
GFR calc non Af Amer: 75 mL/min/{1.73_m2} (ref >59)
Globulin, Total: 2.9 g/dL (ref 1.5–4.5)
Glucose: 140 mg/dL — ABNORMAL HIGH (ref 65–99)
Potassium: 4.1 mmol/L (ref 3.5–5.2)
Sodium: 140 mmol/L (ref 134–144)
Total Protein: 7.1 g/dL (ref 6.0–8.5)

## 2020-04-18 LAB — LIPID PANEL
Chol/HDL Ratio: 2.7 ratio (ref 0.0–5.0)
Cholesterol, Total: 120 mg/dL (ref 100–199)
HDL: 45 mg/dL (ref >39)
LDL Chol Calc (NIH): 55 mg/dL (ref 0–99)
Triglycerides: 106 mg/dL (ref 0–149)
VLDL Cholesterol Cal: 20 mg/dL (ref 5–40)

## 2020-04-18 LAB — MICROALBUMIN / CREATININE URINE RATIO
Creatinine, Urine: 118 mg/dL
Microalb/Creat Ratio: 16 mg/g creat (ref 0–29)
Microalbumin, Urine: 19.2 ug/mL

## 2020-04-21 LAB — PSA TOTAL (REFLEX TO FREE): Prostate Specific Ag, Serum: 5.4 ng/mL — ABNORMAL HIGH (ref 0.0–4.0)

## 2020-04-21 LAB — TESTOSTERONE,FREE AND TOTAL
Testosterone, Free: 8.4 pg/mL (ref 6.6–18.1)
Testosterone: 352 ng/dL (ref 264–916)

## 2020-04-21 LAB — FPSA% REFLEX
% FREE PSA: 20.6 %
PSA, FREE: 1.11 ng/mL

## 2020-04-26 ENCOUNTER — Telehealth: Payer: Self-pay | Admitting: *Deleted

## 2020-04-26 NOTE — Telephone Encounter (Signed)
Called to inform patient that Dr. Livia Snellen has received and reviewed home BP readings.  Per Dr. Livia Snellen first couple days of BP reading were elevated but are continuing to come down to normal range.  Patient is to continue all meds as prescribed.  Patient verbalized understanding.

## 2020-05-08 ENCOUNTER — Other Ambulatory Visit: Payer: Self-pay | Admitting: Family Medicine

## 2020-05-08 DIAGNOSIS — E118 Type 2 diabetes mellitus with unspecified complications: Secondary | ICD-10-CM

## 2020-05-08 DIAGNOSIS — I10 Essential (primary) hypertension: Secondary | ICD-10-CM

## 2020-05-29 ENCOUNTER — Other Ambulatory Visit: Payer: Self-pay | Admitting: Family Medicine

## 2020-05-29 DIAGNOSIS — I10 Essential (primary) hypertension: Secondary | ICD-10-CM

## 2020-05-31 ENCOUNTER — Encounter: Payer: Self-pay | Admitting: *Deleted

## 2020-06-13 ENCOUNTER — Other Ambulatory Visit: Payer: Self-pay | Admitting: Family Medicine

## 2020-06-20 DIAGNOSIS — D2262 Melanocytic nevi of left upper limb, including shoulder: Secondary | ICD-10-CM | POA: Diagnosis not present

## 2020-06-20 DIAGNOSIS — L570 Actinic keratosis: Secondary | ICD-10-CM | POA: Diagnosis not present

## 2020-06-20 DIAGNOSIS — D485 Neoplasm of uncertain behavior of skin: Secondary | ICD-10-CM | POA: Diagnosis not present

## 2020-06-20 DIAGNOSIS — X32XXXD Exposure to sunlight, subsequent encounter: Secondary | ICD-10-CM | POA: Diagnosis not present

## 2020-06-20 DIAGNOSIS — L821 Other seborrheic keratosis: Secondary | ICD-10-CM | POA: Diagnosis not present

## 2020-07-14 ENCOUNTER — Other Ambulatory Visit: Payer: Self-pay | Admitting: Family Medicine

## 2020-07-14 DIAGNOSIS — R0902 Hypoxemia: Secondary | ICD-10-CM | POA: Diagnosis not present

## 2020-07-14 DIAGNOSIS — N200 Calculus of kidney: Secondary | ICD-10-CM | POA: Diagnosis not present

## 2020-07-14 DIAGNOSIS — I10 Essential (primary) hypertension: Secondary | ICD-10-CM | POA: Diagnosis not present

## 2020-07-14 DIAGNOSIS — Z880 Allergy status to penicillin: Secondary | ICD-10-CM | POA: Diagnosis not present

## 2020-07-14 DIAGNOSIS — E119 Type 2 diabetes mellitus without complications: Secondary | ICD-10-CM | POA: Diagnosis not present

## 2020-07-14 DIAGNOSIS — N201 Calculus of ureter: Secondary | ICD-10-CM | POA: Diagnosis not present

## 2020-07-14 DIAGNOSIS — I700 Atherosclerosis of aorta: Secondary | ICD-10-CM | POA: Diagnosis not present

## 2020-07-14 DIAGNOSIS — R109 Unspecified abdominal pain: Secondary | ICD-10-CM | POA: Diagnosis not present

## 2020-07-14 DIAGNOSIS — N139 Obstructive and reflux uropathy, unspecified: Secondary | ICD-10-CM | POA: Diagnosis not present

## 2020-07-14 DIAGNOSIS — N210 Calculus in bladder: Secondary | ICD-10-CM | POA: Diagnosis not present

## 2020-07-14 DIAGNOSIS — R0689 Other abnormalities of breathing: Secondary | ICD-10-CM | POA: Diagnosis not present

## 2020-07-14 DIAGNOSIS — N320 Bladder-neck obstruction: Secondary | ICD-10-CM | POA: Diagnosis not present

## 2020-07-14 DIAGNOSIS — R52 Pain, unspecified: Secondary | ICD-10-CM | POA: Diagnosis not present

## 2020-07-18 ENCOUNTER — Ambulatory Visit: Payer: Medicare Other | Admitting: Family Medicine

## 2020-07-24 ENCOUNTER — Other Ambulatory Visit: Payer: Self-pay | Admitting: Family Medicine

## 2020-07-24 DIAGNOSIS — I10 Essential (primary) hypertension: Secondary | ICD-10-CM

## 2020-07-28 ENCOUNTER — Other Ambulatory Visit: Payer: Self-pay | Admitting: Family Medicine

## 2020-07-28 DIAGNOSIS — I10 Essential (primary) hypertension: Secondary | ICD-10-CM

## 2020-08-11 ENCOUNTER — Other Ambulatory Visit: Payer: Self-pay | Admitting: Family Medicine

## 2020-08-15 ENCOUNTER — Ambulatory Visit: Payer: Medicare Other | Admitting: Family Medicine

## 2020-08-29 ENCOUNTER — Other Ambulatory Visit: Payer: Self-pay

## 2020-08-29 ENCOUNTER — Other Ambulatory Visit: Payer: Medicare Other

## 2020-08-29 ENCOUNTER — Other Ambulatory Visit: Payer: Self-pay | Admitting: Family Medicine

## 2020-08-29 DIAGNOSIS — I10 Essential (primary) hypertension: Secondary | ICD-10-CM

## 2020-08-29 DIAGNOSIS — R972 Elevated prostate specific antigen [PSA]: Secondary | ICD-10-CM

## 2020-08-29 DIAGNOSIS — E559 Vitamin D deficiency, unspecified: Secondary | ICD-10-CM

## 2020-08-29 DIAGNOSIS — E782 Mixed hyperlipidemia: Secondary | ICD-10-CM | POA: Diagnosis not present

## 2020-08-29 DIAGNOSIS — E291 Testicular hypofunction: Secondary | ICD-10-CM

## 2020-08-29 DIAGNOSIS — E118 Type 2 diabetes mellitus with unspecified complications: Secondary | ICD-10-CM | POA: Diagnosis not present

## 2020-08-29 LAB — BAYER DCA HB A1C WAIVED: HB A1C (BAYER DCA - WAIVED): 6.7 % (ref <7.0)

## 2020-08-30 ENCOUNTER — Ambulatory Visit: Payer: Medicare Other | Admitting: Family Medicine

## 2020-08-30 LAB — CMP14+EGFR
ALT: 22 IU/L (ref 0–44)
AST: 13 IU/L (ref 0–40)
Albumin/Globulin Ratio: 1.6 (ref 1.2–2.2)
Albumin: 4.2 g/dL (ref 3.8–4.8)
Alkaline Phosphatase: 126 IU/L — ABNORMAL HIGH (ref 44–121)
BUN/Creatinine Ratio: 15 (ref 10–24)
BUN: 16 mg/dL (ref 8–27)
Bilirubin Total: 0.6 mg/dL (ref 0.0–1.2)
CO2: 25 mmol/L (ref 20–29)
Calcium: 9.4 mg/dL (ref 8.6–10.2)
Chloride: 101 mmol/L (ref 96–106)
Creatinine, Ser: 1.05 mg/dL (ref 0.76–1.27)
GFR calc Af Amer: 83 mL/min/{1.73_m2} (ref >59)
GFR calc non Af Amer: 72 mL/min/{1.73_m2} (ref >59)
Globulin, Total: 2.7 g/dL (ref 1.5–4.5)
Glucose: 134 mg/dL — ABNORMAL HIGH (ref 65–99)
Potassium: 4.2 mmol/L (ref 3.5–5.2)
Sodium: 139 mmol/L (ref 134–144)
Total Protein: 6.9 g/dL (ref 6.0–8.5)

## 2020-08-30 LAB — TESTOSTERONE,FREE AND TOTAL
Testosterone, Free: 11 pg/mL (ref 6.6–18.1)
Testosterone: 433 ng/dL (ref 264–916)

## 2020-08-30 LAB — CBC WITH DIFFERENTIAL/PLATELET
Basophils Absolute: 0.1 10*3/uL (ref 0.0–0.2)
Basos: 1 %
EOS (ABSOLUTE): 0.1 10*3/uL (ref 0.0–0.4)
Eos: 1 %
Hematocrit: 45.2 % (ref 37.5–51.0)
Hemoglobin: 15.3 g/dL (ref 13.0–17.7)
Immature Grans (Abs): 0 10*3/uL (ref 0.0–0.1)
Immature Granulocytes: 0 %
Lymphocytes Absolute: 1.7 10*3/uL (ref 0.7–3.1)
Lymphs: 25 %
MCH: 30.5 pg (ref 26.6–33.0)
MCHC: 33.8 g/dL (ref 31.5–35.7)
MCV: 90 fL (ref 79–97)
Monocytes Absolute: 0.4 10*3/uL (ref 0.1–0.9)
Monocytes: 7 %
Neutrophils Absolute: 4.4 10*3/uL (ref 1.4–7.0)
Neutrophils: 66 %
Platelets: 205 10*3/uL (ref 150–450)
RBC: 5.02 x10E6/uL (ref 4.14–5.80)
RDW: 13.1 % (ref 11.6–15.4)
WBC: 6.7 10*3/uL (ref 3.4–10.8)

## 2020-08-30 LAB — LIPID PANEL
Chol/HDL Ratio: 2.8 ratio (ref 0.0–5.0)
Cholesterol, Total: 132 mg/dL (ref 100–199)
HDL: 47 mg/dL (ref >39)
LDL Chol Calc (NIH): 63 mg/dL (ref 0–99)
Triglycerides: 123 mg/dL (ref 0–149)
VLDL Cholesterol Cal: 22 mg/dL (ref 5–40)

## 2020-08-30 LAB — PSA, TOTAL AND FREE
PSA, Free Pct: 19.5 %
PSA, Free: 0.86 ng/mL
Prostate Specific Ag, Serum: 4.4 ng/mL — ABNORMAL HIGH (ref 0.0–4.0)

## 2020-08-30 LAB — VITAMIN D 25 HYDROXY (VIT D DEFICIENCY, FRACTURES): Vit D, 25-Hydroxy: 51.9 ng/mL (ref 30.0–100.0)

## 2020-08-31 IMAGING — CT CT ABDOMEN AND PELVIS WITH CONTRAST
2 of 5 series · 15 of 46 positions shown, 17 images · IV contrast (APPLIED)
Comparison: Most recent CT 06/14/2018

CLINICAL DATA: Acute abdominal pain.

EXAM:
CT ABDOMEN AND PELVIS WITH CONTRAST
TECHNIQUE: Multidetector CT imaging of the abdomen and pelvis was performed
using the standard protocol following bolus administration of
intravenous contrast.
CONTRAST:  100mL OMNIPAQUE IOHEXOL 300 MG/ML  SOLN

[Series 2: axial st · axial · 0.78mm/px · z∈[-556,-106]mm · 12 of 102 slices shown, 14 images]
[im 6/102  soft-tissue]
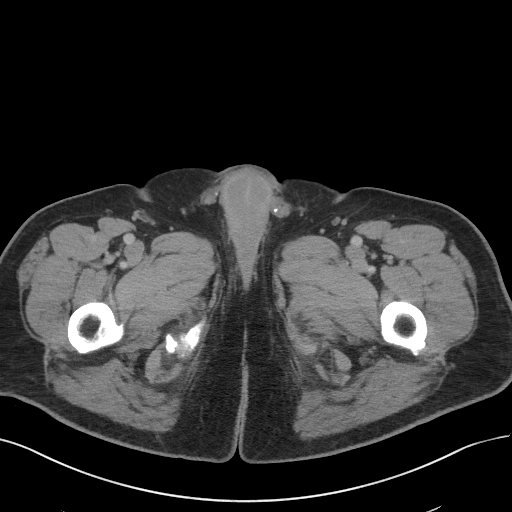
[im 6/102  bone]
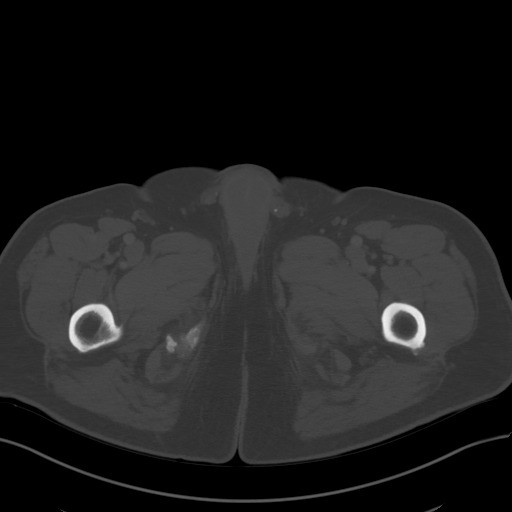
[im 16/102  soft-tissue]
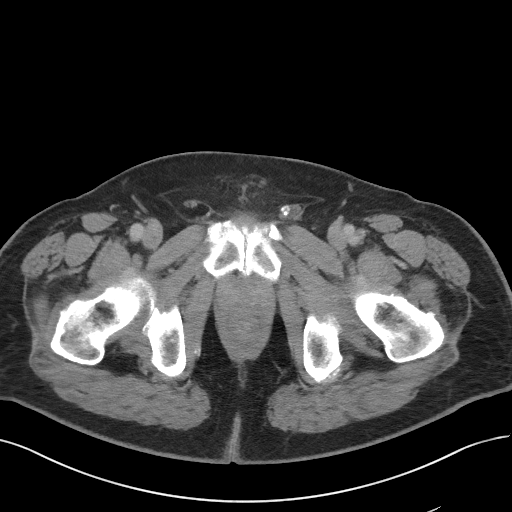
[im 22/102  soft-tissue]
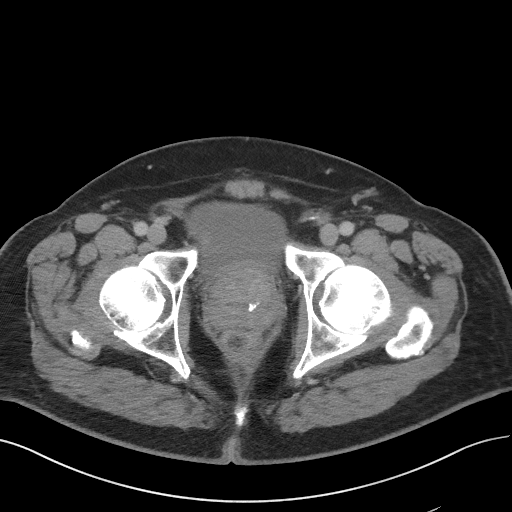
[im 32/102  soft-tissue]
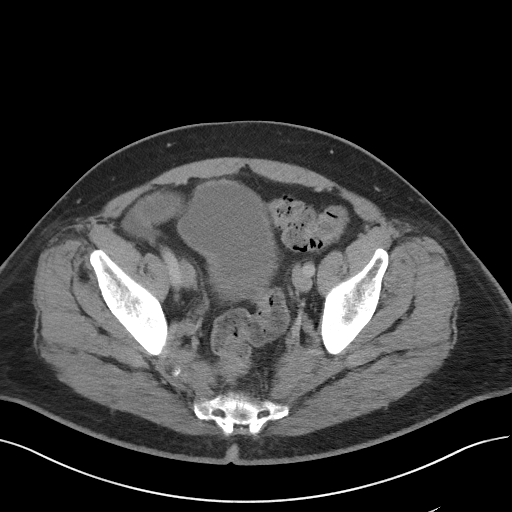
[im 38/102  soft-tissue]
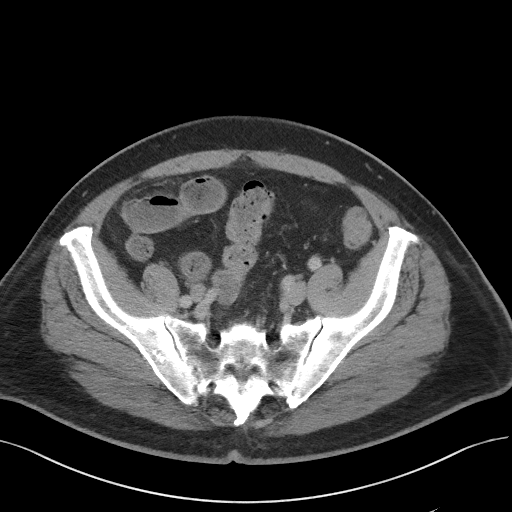
[im 48/102  soft-tissue]
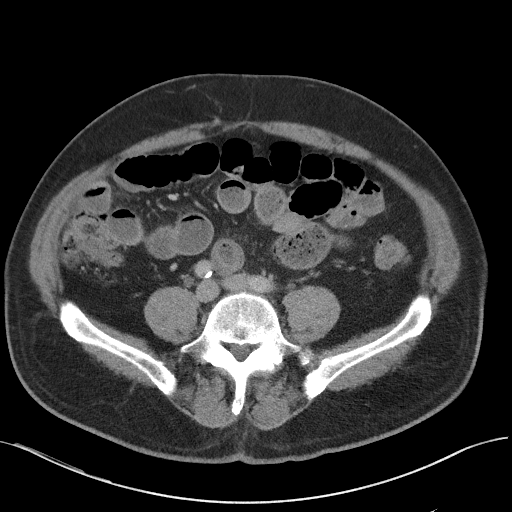
[im 54/102  soft-tissue]
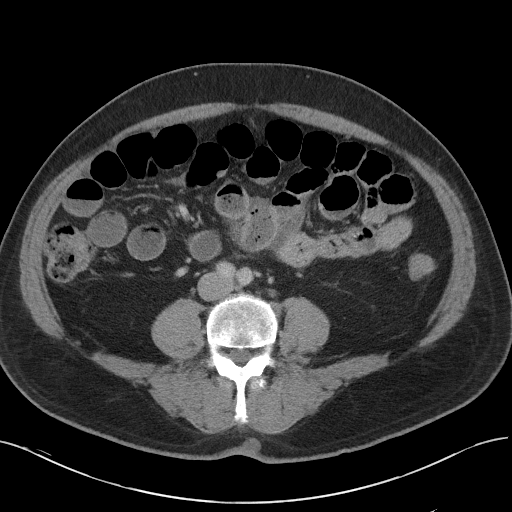
[im 64/102  soft-tissue]
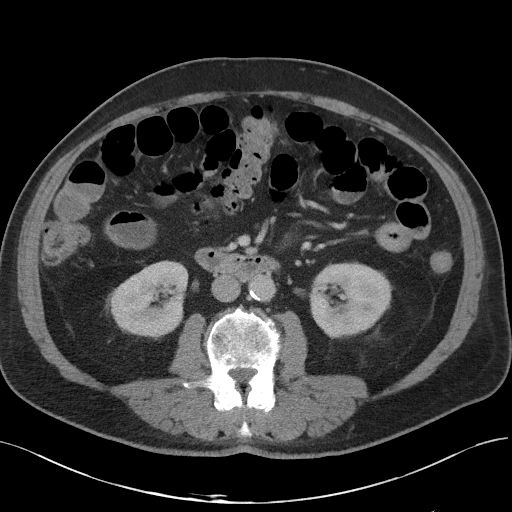
[im 70/102  soft-tissue]
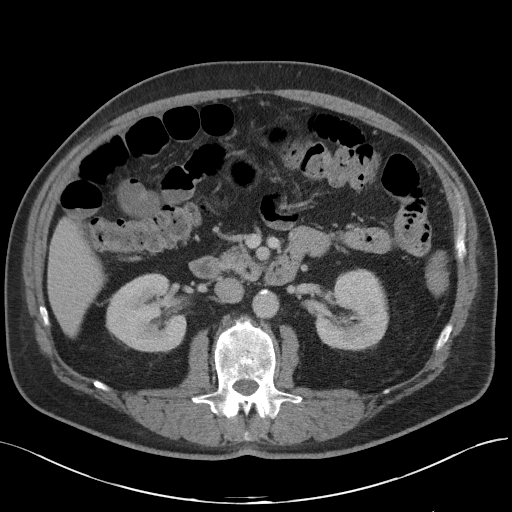
[im 70/102  bone]
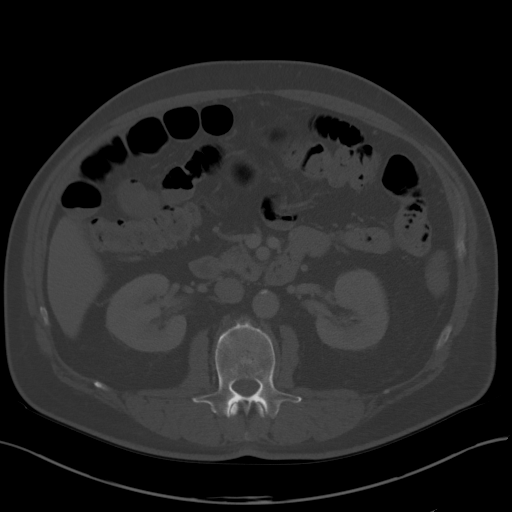
[im 80/102  soft-tissue]
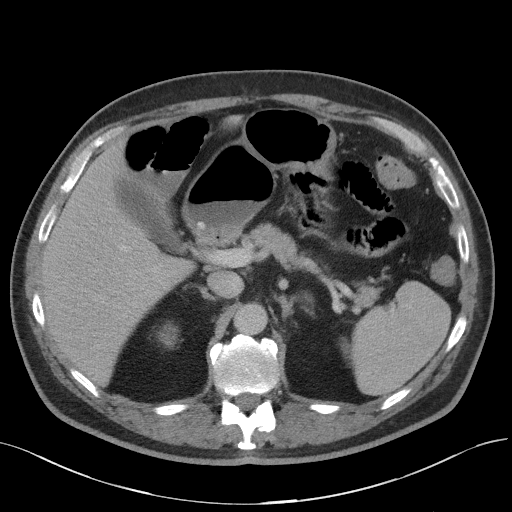
[im 86/102  soft-tissue]
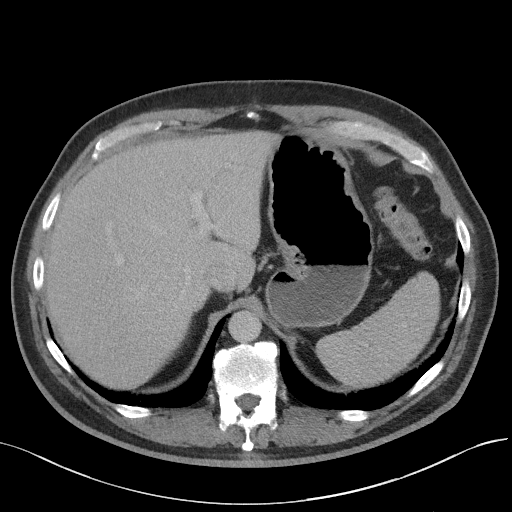
[im 96/102  soft-tissue]
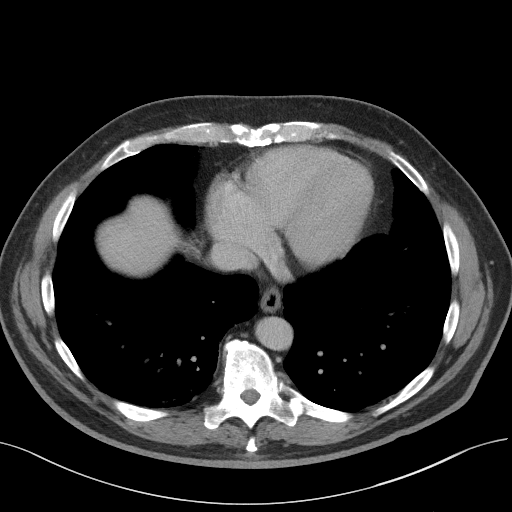

[Series 5: coronal st · coronal · 0.76mm/px · 3 of 111 slices shown]
[im 37/111  soft-tissue]
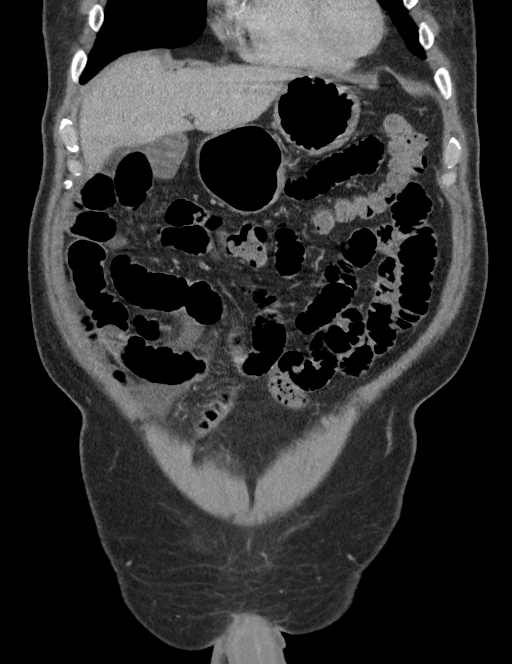
[im 49/111  soft-tissue]
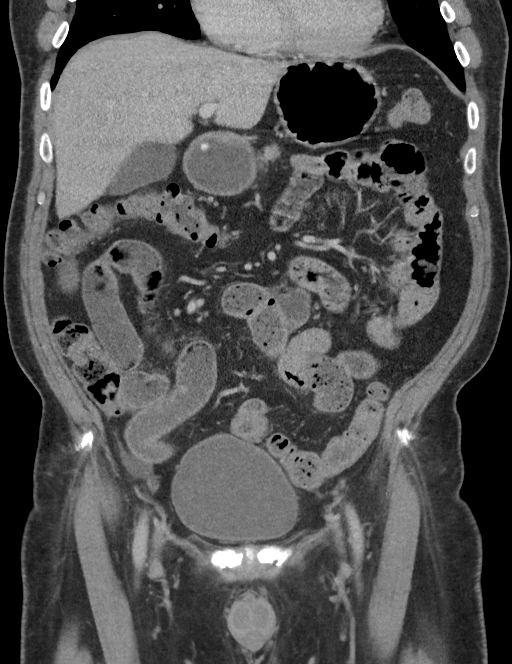
[im 62/111  soft-tissue]
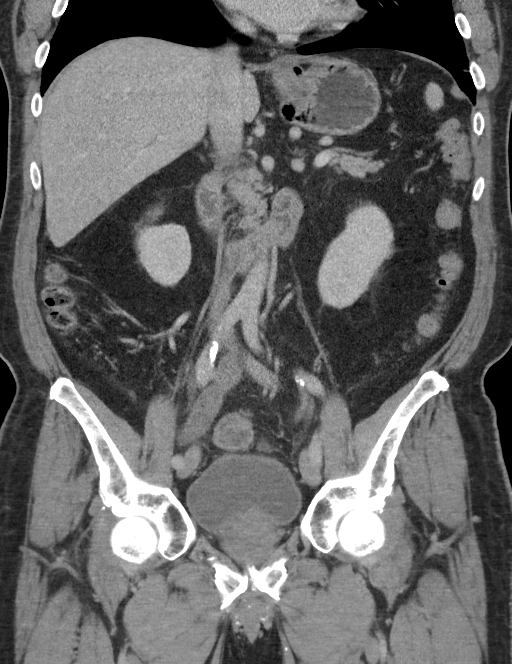

[15 of 46 positions shown; findings below may reference images not displayed]

FINDINGS: Lower chest: Mild bibasilar atelectasis. No confluent airspace
disease. There are coronary artery calcifications.

Hepatobiliary: No focal liver abnormality is seen. Few scattered
calcified hepatic granuloma. No gallstones, gallbladder wall
thickening, or biliary dilatation.

Pancreas: No ductal dilatation or inflammation.

Spleen: Normal in size without focal abnormality.

Adrenals/Urinary Tract: No adrenal nodule. No hydronephrosis. Mild
symmetric bilateral perinephric edema, similar to prior. Possible 7
mm stone in the distal right ureter, chronic and unchanged from
prior. Adjacent punctate density. Symmetric excretion on delayed
phase imaging. Unchanged subcentimeter low-density in the posterior
left kidney, too small to characterize but likely small cyst.
Urinary bladder is physiologically distended. No bladder wall
thickening.

Stomach/Bowel: Prior gastric fundoplication. Stomach mildly
distended with fluid/ingested contents. No gastric wall thickening.
Mildly dilated fluid-filled distal small bowel measuring up to 3 cm.
There is fecalization of distal small bowel contents. Mild
mesenteric edema. No discrete transition point, gradual tapering to
the terminal ileum. Normal appendix. Moderate stool burden in the
colon. No colonic wall thickening or inflammation.

Vascular/Lymphatic: Aorto bi-iliac atherosclerosis. No aneurysm.
Portal vein is patent. No enlarged lymph nodes in the abdomen or
pelvis.

Reproductive: Heterogeneous enlarged prostate gland measuring 5.9 x
5.6 x 5.0 cm (volume = 86 cm^3). Seminal vesicle and vas deferens
calcifications.

Other: Mild mesenteric edema and minimal free fluid in the right
mesentery. No free air or intra-abdominal abscess.

Musculoskeletal: There are no acute or suspicious osseous
abnormalities. Degenerative disc disease at L5-S1 with minimal
retrolisthesis of L5 on S1. Unchanged sclerotic density in the right
iliac bone.
IMPRESSION: 1. Mild fluid-filled small bowel dilatation as well as fecalization
of small bowel contents. Mild associated mesenteric edema and free
fluid. Appearance is similar to prior exams and suggest generalized
slow transit/ileus. No transition point to suggest obstruction.
2. Possible nonobstructing distal right ureteric stone, unchanged
from prior. No hydronephrosis.
3. Enlarged prostate gland.

Aortic Atherosclerosis ([id number]).

## 2020-09-11 ENCOUNTER — Other Ambulatory Visit: Payer: Self-pay | Admitting: Family Medicine

## 2020-09-27 ENCOUNTER — Other Ambulatory Visit: Payer: Self-pay | Admitting: Family Medicine

## 2020-09-27 DIAGNOSIS — I10 Essential (primary) hypertension: Secondary | ICD-10-CM

## 2020-10-10 ENCOUNTER — Ambulatory Visit: Payer: Medicare Other | Admitting: Family Medicine

## 2020-10-13 ENCOUNTER — Other Ambulatory Visit: Payer: Self-pay | Admitting: Family Medicine

## 2020-10-23 ENCOUNTER — Other Ambulatory Visit: Payer: Self-pay | Admitting: Family Medicine

## 2020-10-23 DIAGNOSIS — I10 Essential (primary) hypertension: Secondary | ICD-10-CM

## 2020-10-24 ENCOUNTER — Ambulatory Visit (INDEPENDENT_AMBULATORY_CARE_PROVIDER_SITE_OTHER): Payer: Medicare Other | Admitting: Family Medicine

## 2020-10-24 ENCOUNTER — Encounter: Payer: Self-pay | Admitting: Family Medicine

## 2020-10-24 ENCOUNTER — Other Ambulatory Visit: Payer: Self-pay

## 2020-10-24 VITALS — BP 131/87 | HR 68 | Temp 97.1°F | Ht 72.0 in | Wt 231.4 lb

## 2020-10-24 DIAGNOSIS — Z23 Encounter for immunization: Secondary | ICD-10-CM | POA: Diagnosis not present

## 2020-10-24 DIAGNOSIS — E118 Type 2 diabetes mellitus with unspecified complications: Secondary | ICD-10-CM | POA: Insufficient documentation

## 2020-10-24 DIAGNOSIS — E782 Mixed hyperlipidemia: Secondary | ICD-10-CM

## 2020-10-24 DIAGNOSIS — I10 Essential (primary) hypertension: Secondary | ICD-10-CM | POA: Diagnosis not present

## 2020-10-24 MED ORDER — METOPROLOL SUCCINATE ER 100 MG PO TB24
ORAL_TABLET | ORAL | 1 refills | Status: DC
Start: 2020-10-24 — End: 2021-04-24

## 2020-10-24 MED ORDER — AMLODIPINE BESYLATE 5 MG PO TABS: ORAL_TABLET | ORAL | 1 refills | Status: DC

## 2020-10-24 MED ORDER — PRAVASTATIN SODIUM 20 MG PO TABS: 20.0000 mg | ORAL_TABLET | Freq: Every day | ORAL | 3 refills | Status: DC

## 2020-10-24 MED ORDER — LISINOPRIL 20 MG PO TABS: 20.0000 mg | ORAL_TABLET | Freq: Every day | ORAL | 1 refills | Status: DC

## 2020-10-24 MED ORDER — METFORMIN HCL 500 MG PO TABS: ORAL_TABLET | ORAL | 1 refills | Status: DC

## 2020-10-24 NOTE — Progress Notes (Signed)
Subjective:  Patient ID: [name],  male    DOB: 23-Aug-1951  Age: 70 y.o.    CC: Medical Management of Chronic Issues (6 month)   HPI Tay Whitwell presents for  follow-up of hypertension. Patient has no history of headache chest pain or shortness of breath or recent cough. Patient also denies symptoms of TIA such as numbness weakness lateralizing. Patient denies side effects from medication. States taking it regularly.  Patient also  in for follow-up of elevated cholesterol. Doing well without complaints on current medication. Denies side effects  including myalgia and arthralgia and nausea. Also in today for liver function testing. Currently no chest pain, shortness of breath or other cardiovascular related symptoms noted.  Follow-up of diabetes. Patient does check blood sugar at home. Readings run between 100 and 150 Patient denies symptoms such as excessive hunger or urinary frequency, excessive hunger, nausea No significant hypoglycemic spells noted. Medications reviewed. Pt reports taking them regularly. Pt. denies complication/adverse reaction today.    History Salahuddin has a past medical history of Arthritis, Atypical small acinar proliferation of prostate, Benign paroxysmal positional vertigo, Diabetes mellitus without complication ([place]), Elevated PSA, Essential hypertension, benign, GERD (gastroesophageal reflux disease), History of basal cell carcinoma excision, History of hiatal hernia, History of kidney stones, History of melanoma excision, History of small bowel obstruction, Hyperlipidemia, LAFB (left anterior fascicular block), and Prediabetes.   He has a past surgical history that includes Lumbar disc surgery (x3   last one 1980's); Gastric fundoplication (0932'I); and Prostate biopsy (N/A, 11/21/2015).   His family history includes Diabetes in his father; Hypertension in his father and mother; Melanoma in his mother.He reports that he has never smoked. He has never used  smokeless tobacco. He reports current alcohol use. He reports that he does not use drugs.  Current Outpatient Medications on File Prior to Visit  Medication Sig Dispense Refill  . aspirin 81 MG tablet Take 81 mg by mouth daily.    . blood glucose meter kit and supplies KIT Dispense based on patient and insurance preference. Use up to four times daily as directed. (FOR ICD-10 : E11.9 1 each 0  . Cholecalciferol (VITAMIN D) 2000 UNITS CAPS Take 1 capsule by mouth daily.     Marland Kitchen glucose blood test strip Use as instructed 100 each 12   No current facility-administered medications on file prior to visit.    ROS Review of Systems  Constitutional: Negative for fever.  Respiratory: Negative for shortness of breath.   Cardiovascular: Negative for chest pain.  Musculoskeletal: Negative for arthralgias.  Skin: Negative for rash.    Objective:  BP 131/87   Pulse 68   Temp (!) 97.1 F (36.2 C) (Temporal)   Ht 6' (1.829 m)   Wt 231 lb 6.4 oz (105 kg)   BMI 31.38 kg/m   BP Readings from Last 3 Encounters:  10/24/20 131/87  04/17/20 (!) 157/82  10/27/19 135/79    Wt Readings from Last 3 Encounters:  10/24/20 231 lb 6.4 oz (105 kg)  04/17/20 232 lb 8 oz (105.5 kg)  10/27/19 227 lb (103 kg)     Physical Exam Vitals reviewed.  Constitutional:      Appearance: He is well-developed and well-nourished.  HENT:     Head: Normocephalic and atraumatic.     Right Ear: Tympanic membrane and external ear normal. No decreased hearing noted.     Left Ear: Tympanic membrane and external ear normal. No decreased hearing noted.  Mouth/Throat:     Pharynx: No oropharyngeal exudate or posterior oropharyngeal erythema.  Eyes:     Pupils: Pupils are equal, round, and reactive to light.  Cardiovascular:     Rate and Rhythm: Normal rate and regular rhythm.     Heart sounds: No murmur heard.   Pulmonary:     Effort: No respiratory distress.     Breath sounds: Normal breath sounds.  Abdominal:      General: Bowel sounds are normal.     Palpations: Abdomen is soft. There is no mass.     Tenderness: There is no abdominal tenderness.  Musculoskeletal:     Cervical back: Normal range of motion and neck supple.     Assessment & Plan:   Jakayden was seen today for medical management of chronic issues.  Diagnoses and all orders for this visit:  Controlled type 2 diabetes mellitus with complication, without long-term current use of insulin (HCC) -     pravastatin (PRAVACHOL) 20 MG tablet; Take 1 tablet (20 mg total) by mouth daily. -     lisinopril (ZESTRIL) 20 MG tablet; Take 1 tablet (20 mg total) by mouth daily.  Mixed hyperlipidemia -     pravastatin (PRAVACHOL) 20 MG tablet; Take 1 tablet (20 mg total) by mouth daily.  Hypertension -     metoprolol succinate (TOPROL-XL) 100 MG 24 hr tablet; Take with or immediately following a meal. -     lisinopril (ZESTRIL) 20 MG tablet; Take 1 tablet (20 mg total) by mouth daily. -     amLODipine (NORVASC) 5 MG tablet; Take 1 & 1/2 tablets in the morning  Other orders -     metFORMIN (GLUCOPHAGE) 500 MG tablet; Take 1 tablet daily with breakfast   I have changed Kylian Stracke's metoprolol succinate, metFORMIN, lisinopril, and amLODipine. I am also having him maintain his Vitamin D, aspirin, blood glucose meter kit and supplies, glucose blood, and pravastatin.  Meds ordered this encounter  Medications  . pravastatin (PRAVACHOL) 20 MG tablet    Sig: Take 1 tablet (20 mg total) by mouth daily.    Dispense:  90 tablet    Refill:  3  . metoprolol succinate (TOPROL-XL) 100 MG 24 hr tablet    Sig: Take with or immediately following a meal.    Dispense:  90 tablet    Refill:  1  . metFORMIN (GLUCOPHAGE) 500 MG tablet    Sig: Take 1 tablet daily with breakfast    Dispense:  90 tablet    Refill:  1  . lisinopril (ZESTRIL) 20 MG tablet    Sig: Take 1 tablet (20 mg total) by mouth daily.    Dispense:  90 tablet    Refill:  1  . amLODipine  (NORVASC) 5 MG tablet    Sig: Take 1 & 1/2 tablets in the morning    Dispense:  135 tablet    Refill:  1     Follow-up: No follow-ups on file.  Claretta Fraise, M.D.

## 2021-01-24 LAB — HM DIABETES EYE EXAM

## 2021-02-05 DIAGNOSIS — R3915 Urgency of urination: Secondary | ICD-10-CM | POA: Diagnosis not present

## 2021-02-20 DIAGNOSIS — D485 Neoplasm of uncertain behavior of skin: Secondary | ICD-10-CM | POA: Diagnosis not present

## 2021-02-20 DIAGNOSIS — D225 Melanocytic nevi of trunk: Secondary | ICD-10-CM | POA: Diagnosis not present

## 2021-02-20 DIAGNOSIS — D2271 Melanocytic nevi of right lower limb, including hip: Secondary | ICD-10-CM | POA: Diagnosis not present

## 2021-02-20 DIAGNOSIS — B078 Other viral warts: Secondary | ICD-10-CM | POA: Diagnosis not present

## 2021-02-20 DIAGNOSIS — Z08 Encounter for follow-up examination after completed treatment for malignant neoplasm: Secondary | ICD-10-CM | POA: Diagnosis not present

## 2021-02-20 DIAGNOSIS — L821 Other seborrheic keratosis: Secondary | ICD-10-CM | POA: Diagnosis not present

## 2021-02-20 DIAGNOSIS — Z85820 Personal history of malignant melanoma of skin: Secondary | ICD-10-CM | POA: Diagnosis not present

## 2021-02-20 DIAGNOSIS — Z1283 Encounter for screening for malignant neoplasm of skin: Secondary | ICD-10-CM | POA: Diagnosis not present

## 2021-03-09 ENCOUNTER — Encounter: Payer: Self-pay | Admitting: Nurse Practitioner

## 2021-03-09 ENCOUNTER — Ambulatory Visit (INDEPENDENT_AMBULATORY_CARE_PROVIDER_SITE_OTHER): Payer: Medicare Other | Admitting: Family Medicine

## 2021-03-09 VITALS — BP 155/78 | HR 68 | Temp 98.1°F | Resp 20 | Ht 72.0 in | Wt 231.0 lb

## 2021-03-09 DIAGNOSIS — S51812A Laceration without foreign body of left forearm, initial encounter: Secondary | ICD-10-CM

## 2021-03-09 DIAGNOSIS — S61432A Puncture wound without foreign body of left hand, initial encounter: Secondary | ICD-10-CM

## 2021-03-09 DIAGNOSIS — Z23 Encounter for immunization: Secondary | ICD-10-CM | POA: Diagnosis not present

## 2021-03-09 NOTE — Progress Notes (Signed)
Subjective: CC: Laceration PCP: Claretta Fraise, MD EBR:AXENM [name] is a 70 y.o. male presenting to clinic today for:  1.  Laceration Patient presents with a laceration of the left forearm, which she sustained moving furniture about 30 minutes ago.  He injured the ventral aspect of the left forearm as well as the dorsal aspect of the left hand, which she describes as a puncture wound.  Bleeding has slowed to ooze.  He denies any allergy to any medications.  No treatments tried thus far.  He simply been putting pressure on the area.  He is unsure of tetanus.   ROS: Per HPI  Allergies  Allergen Reactions   Penicillins Itching and Rash    Has patient had a PCN reaction causing immediate rash, facial/tongue/throat swelling, SOB or lightheadedness with hypotension: Yes Has patient had a PCN reaction causing severe rash involving mucus membranes or skin necrosis: No Has patient had a PCN reaction that required hospitalization: No Has patient had a PCN reaction occurring within the last 10 years: No If all of the above answers are "NO", then may proceed with Cephalosporin use.    Past Medical History:  Diagnosis Date   Arthritis    Atypical small acinar proliferation of prostate    Benign paroxysmal positional vertigo    Diabetes mellitus without complication (HCC)    Elevated PSA    Essential hypertension, benign    GERD (gastroesophageal reflux disease)    History of basal cell carcinoma excision    few times   History of hiatal hernia    History of kidney stones    History of melanoma excision    x1  localized   History of small bowel obstruction    2009  &  2013--  both times resolved without surgical intervevtion   Hyperlipidemia    LAFB (left anterior fascicular block)    Prediabetes     Current Outpatient Medications:    amLODipine (NORVASC) 5 MG tablet, Take 1 & 1/2 tablets in the morning, Disp: 135 tablet, Rfl: 1   aspirin 81 MG tablet, Take 81 mg by mouth daily.,  Disp: , Rfl:    blood glucose meter kit and supplies KIT, Dispense based on patient and insurance preference. Use up to four times daily as directed. (FOR ICD-10 : E11.9, Disp: 1 each, Rfl: 0   Cholecalciferol (VITAMIN D) 2000 UNITS CAPS, Take 1 capsule by mouth daily. , Disp: , Rfl:    glucose blood test strip, Use as instructed, Disp: 100 each, Rfl: 12   lisinopril (ZESTRIL) 20 MG tablet, Take 1 tablet (20 mg total) by mouth daily., Disp: 90 tablet, Rfl: 1   metFORMIN (GLUCOPHAGE) 500 MG tablet, Take 1 tablet daily with breakfast, Disp: 90 tablet, Rfl: 1   metoprolol succinate (TOPROL-XL) 100 MG 24 hr tablet, Take with or immediately following a meal., Disp: 90 tablet, Rfl: 1   pravastatin (PRAVACHOL) 20 MG tablet, Take 1 tablet (20 mg total) by mouth daily., Disp: 90 tablet, Rfl: 3 Social History   Socioeconomic History   Marital status: Married    Spouse name: Not on file   Number of children: Not on file   Years of education: Not on file   Highest education level: Not on file  Occupational History   Not on file  Tobacco Use   Smoking status: Never   Smokeless tobacco: Never  Vaping Use   Vaping Use: Never used  Substance and Sexual Activity   Alcohol use: Yes  Comment: rare   Drug use: No   Sexual activity: Not on file  Other Topics Concern   Not on file  Social History Narrative   Not on file   Social Determinants of Health   Financial Resource Strain: Not on file  Food Insecurity: Not on file  Transportation Needs: Not on file  Physical Activity: Not on file  Stress: Not on file  Social Connections: Not on file  Intimate Partner Violence: Not on file   Family History  Problem Relation Age of Onset   Hypertension Mother    Melanoma Mother    Hypertension Father    Diabetes Father     Objective: Office vital signs reviewed. BP (!) 155/78   Pulse 68   Temp 98.1 F (36.7 C) (Temporal)   Resp 20   Ht 6' (1.829 m)   Wt 231 lb (104.8 kg)   SpO2 98%   BMI  31.33 kg/m   Physical Examination:  General: Awake, alert, well nourished, No acute distress Extremities: warm, well perfused, No edema, cyanosis or clubbing; +2 pulses bilaterally MSK:   Left elbow: He has full active range of motion of the elbow.  No gross swelling or deformity noted  Left wrist: Full active range of motion of the wrist.  No gross swelling, ecchymosis or deformity noted  Skin: Approximately 1.5 inch curvilinear laceration noted to the left ventral forearm involving the superficial layers.  There was active oozing on exam but no arterial bleeds appreciated.  Approximately 4 inch superficial laceration noted along the distal wrist.  Puncture wound noted to the left dorsal hand.  No apparent tendon involvement.  Procedure: Verbal consent obtained.  Left forearm with approximately 2 inch curvilinear laceration noted along the ventral aspect.  Slow ooze noted.  No arterial bleeds appreciated.  Area was cleaned with sterile saline and 4 x 4's.  Area was then anesthetized with 2 cc of lidocaine with epinephrine.  After anesthesia was obtained, wound was cleaned again with Betadine x2 and borders were approximated using sterile pickups.  3-0 Ethilon was used to place 5 sutures.  Lesion was then wrapped in a pressure bandage.  Dermabond was used along the ventral aspect of the left hand and along the much more superficial laceration noted along the distal wrist.  Assessment/ Plan: 70 y.o. male   Laceration of left forearm, initial encounter  Puncture wound of left hand without foreign body, initial encounter  Need for tetanus booster  Repaired with Ethilon 3-0 sutures.  Patient was hemostatic but a pressure bandage was applied.  He tolerated procedure without difficulty.  Home care instructions reviewed including signs and symptoms of infection or other concerning features.  He will follow-up in approximately 9 to 10 days for suture removal.  Puncture wound of left hand showed  no evidence of foreign body.  It was hemostatic but I did apply Dermabond to the affected area in efforts to protect it from any infection or reinjury  Tetanus shot was administered during today's visit.  No orders of the defined types were placed in this encounter.  No orders of the defined types were placed in this encounter.    Janora Norlander, DO [facility] [phone]

## 2021-03-09 NOTE — Patient Instructions (Signed)
Dermabond will fall off on it's own in a week or so.  Come back 6/17 for suture removal.  NO baths/ hot tubs/ pools until sutures are removed.  Showering is fine.  Leave pressure bandage on until tomorrow morning and then you can remove and rely on sutures alone.  Be careful not to injur/ overuse the arm/ hand.  Sutured Wound Care Sutures are stitches that can be used to close wounds. Taking care of your wound properly can help to prevent pain and infection. It can also help your wound to heal more quickly. Follow instructions from your health care providerabout how to care for your sutured wound. Supplies needed: Soap and water. A clean bandage (dressing), if needed. Antibiotic ointment. A clean towel. How to care for your sutured wound  Keep the wound completely dry for the first 24 hours, or for as long as directed by your health care provider. After 24-48 hours, you may shower or bathe as directed by your health care provider. Do not soak or submerge the wound in water until the sutures have been removed. After the first 24 hours, clean the wound once a day, or as often as directed by your health care provider, using the following steps: Wash the wound with soap and water. Rinse the wound with water to remove all soap. Pat the wound dry with a clean towel. Do not rub the wound. After cleaning the wound, apply a thin layer of antibiotic ointment as directed by your health care provider. This will prevent infection and keep the dressing from sticking to the wound. Follow instructions from your health care provider about how to change your dressing: Wash your hands with soap and water. If soap and water are not available, use hand sanitizer. Change your dressing at least once a day, or as often as told by your health care provider. If your dressing gets wet or dirty, change it. Leave sutures and other skin closures, such as adhesive tape or skin glue, in place. These skin closures may need to  stay in place for 2 weeks or longer. If adhesive strip edges start to loosen and curl up, you may trim the loose edges. Do not remove adhesive strips completely unless your health care provider tells you to do that. Check your wound every day for signs of infection. Watch for: Redness, swelling, or pain. Fluid or blood. Warmth. Pus or a bad smell. Have the sutures removed as directed by your health care provider. Follow these instructions at home: Medicines Take or apply over-the-counter and prescription medicines only as told by your health care provider. If you were prescribed an antibiotic medicine or ointment, take or apply it as told by your health care provider. Do not stop using the antibiotic even if your condition improves. General instructions To help reduce scarring after your wound heals, cover your wound with clothing or apply sunscreen of at least 30 SPF whenever you are outside. Do not scratch or pick at your wound. Avoid stretching your wound. Raise (elevate) the injured area above the level of your heart while you are sitting or lying down, if possible. Drink enough fluids to keep your urine clear or pale yellow. Keep all follow-up visits as told by your health care provider. This is important. Contact a health care provider if: You received a tetanus shot and you have swelling, severe pain, redness, or bleeding at the injection site. Your wound breaks open. You have redness, swelling, or pain around your wound. You  have fluid or blood coming from your wound. Your wound feels warm to the touch. You have a fever. You notice something coming out of your wound, such as wood or glass. You have pain that does not get better with medicine. The skin near your wound changes color. You need to change your dressing very frequently due to a lot of fluid, blood, or pus draining from the wound. You develop a new rash. You develop numbness around the wound. Get help right away  if: You develop severe swelling around your wound. You have pus or a bad smell coming from your wound. Your pain suddenly gets worse and is severe. You develop painful lumps near your wound or anywhere on your body. You have a red streak going away from your wound. The wound is on your hand or foot and: You cannot properly move a finger or toe. Your fingers or toes look pale or bluish. You have numbness that is spreading down your hand, foot, fingers, or toes. Summary Sutures are stitches that can be used to close wounds. Taking care of your wound properly can help to prevent pain and infection. Keep the wound completely dry for the first 24 hours, or for as long as directed by your health care provider. After 24-48 hours, you may shower or bathe as directed by your health care provider. This information is not intended to replace advice given to you by your health care provider. Make sure you discuss any questions you have with your healthcare provider. Document Revised: 07/13/2020 Document Reviewed: 07/14/2020 Elsevier Patient Education  2022 [employer].

## 2021-03-16 ENCOUNTER — Ambulatory Visit (INDEPENDENT_AMBULATORY_CARE_PROVIDER_SITE_OTHER): Payer: Medicare Other | Admitting: Family Medicine

## 2021-03-16 ENCOUNTER — Other Ambulatory Visit: Payer: Self-pay

## 2021-03-16 ENCOUNTER — Encounter: Payer: Self-pay | Admitting: Family Medicine

## 2021-03-16 VITALS — BP 162/83 | HR 59 | Temp 97.2°F | Ht 73.0 in | Wt 230.8 lb

## 2021-03-16 DIAGNOSIS — L089 Local infection of the skin and subcutaneous tissue, unspecified: Secondary | ICD-10-CM

## 2021-03-16 DIAGNOSIS — S51812D Laceration without foreign body of left forearm, subsequent encounter: Secondary | ICD-10-CM

## 2021-03-16 DIAGNOSIS — T148XXA Other injury of unspecified body region, initial encounter: Secondary | ICD-10-CM

## 2021-03-16 MED ORDER — SULFAMETHOXAZOLE-TRIMETHOPRIM 800-160 MG PO TABS: 1.0000 | ORAL_TABLET | Freq: Two times a day (BID) | ORAL | 0 refills | Status: AC

## 2021-03-16 NOTE — Progress Notes (Signed)
Assessment & Plan:  1. Infected laceration Keep laceration clean. Leave steri-strips in place until they fall off on their own. Bactrim BID x7 days. - sulfamethoxazole-trimethoprim (BACTRIM DS) 800-160 MG tablet; Take 1 tablet by mouth 2 (two) times daily for 7 days.  Dispense: 14 tablet; Refill: 0   Follow up plan: Return if symptoms worsen or fail to improve.  Hendricks Limes, MSN, APRN, FNP-C [facility]  Subjective:   Patient ID: [name], male    DOB: 12/19/1950, 70 y.o.   MRN: [id number]  HPI: [name] is a 70 y.o. male presenting on 03/16/2021 for Suture / Staple Removal (Left wrist )  Patient had 5 sutures placed on 03/09/2021 to the left wrist to close a laceration. He is here today for removal. He is going out of town this weekend for one week.    ROS: Negative unless specifically indicated above in HPI.   Relevant past medical history reviewed and updated as indicated.   Allergies and medications reviewed and updated.   Current Outpatient Medications:    amLODipine (NORVASC) 5 MG tablet, Take 1 & 1/2 tablets in the morning, Disp: 135 tablet, Rfl: 1   aspirin 81 MG tablet, Take 81 mg by mouth daily., Disp: , Rfl:    blood glucose meter kit and supplies KIT, Dispense based on patient and insurance preference. Use up to four times daily as directed. (FOR ICD-10 : E11.9, Disp: 1 each, Rfl: 0   Cholecalciferol (VITAMIN D) 2000 UNITS CAPS, Take 1 capsule by mouth daily. , Disp: , Rfl:    glucose blood test strip, Use as instructed, Disp: 100 each, Rfl: 12   lisinopril (ZESTRIL) 20 MG tablet, Take 1 tablet (20 mg total) by mouth daily., Disp: 90 tablet, Rfl: 1   metFORMIN (GLUCOPHAGE) 500 MG tablet, Take 1 tablet daily with breakfast, Disp: 90 tablet, Rfl: 1   metoprolol succinate (TOPROL-XL) 100 MG 24 hr tablet, Take with or immediately following a meal., Disp: 90 tablet, Rfl: 1   pravastatin (PRAVACHOL) 20 MG tablet, Take 1 tablet (20 mg total) by  mouth daily., Disp: 90 tablet, Rfl: 3  Allergies  Allergen Reactions   Penicillins Itching and Rash    Has patient had a PCN reaction causing immediate rash, facial/tongue/throat swelling, SOB or lightheadedness with hypotension: Yes Has patient had a PCN reaction causing severe rash involving mucus membranes or skin necrosis: No Has patient had a PCN reaction that required hospitalization: No Has patient had a PCN reaction occurring within the last 10 years: No If all of the above answers are "NO", then may proceed with Cephalosporin use.     Objective:   BP (!) 162/83   Pulse (!) 59   Temp (!) 97.2 F (36.2 C) (Temporal)   Ht _0  (1.854 m)   Wt 230 lb 12.8 oz (104.7 kg)   SpO2 94%   BMI 30.45 kg/m    Physical Exam Vitals reviewed.  Constitutional:      General: He is not in acute distress.    Appearance: Normal appearance. He is not ill-appearing, toxic-appearing or diaphoretic.  HENT:     Head: Normocephalic and atraumatic.  Eyes:     General: No scleral icterus.       Right eye: No discharge.        Left eye: No discharge.     Conjunctiva/sclera: Conjunctivae normal.  Cardiovascular:     Rate and Rhythm: Normal rate.  Pulmonary:  Effort: Pulmonary effort is normal. No respiratory distress.  Musculoskeletal:        General: Normal range of motion.     Cervical back: Normal range of motion.  Skin:    General: Skin is warm and dry.     Findings: Laceration (to left wrist with 5 sutures in place - well approximated. Erythema and warmth present. No drainage.) present.  Neurological:     Mental Status: He is alert and oriented to person, place, and time. Mental status is at baseline.  Psychiatric:        Mood and Affect: Mood normal.        Behavior: Behavior normal.        Thought Content: Thought content normal.        Judgment: Judgment normal.   Laceration cleansed with normal saline. Sutures removed x5. Patient tolerated well. No bleeding. Steri-strips  were applied since it has only been 7 days since sutures were originally placed.

## 2021-03-18 ENCOUNTER — Encounter: Payer: Self-pay | Admitting: Family Medicine

## 2021-04-01 ENCOUNTER — Encounter (HOSPITAL_BASED_OUTPATIENT_CLINIC_OR_DEPARTMENT_OTHER): Payer: Self-pay | Admitting: Emergency Medicine

## 2021-04-01 ENCOUNTER — Emergency Department (HOSPITAL_BASED_OUTPATIENT_CLINIC_OR_DEPARTMENT_OTHER)
Admission: EM | Admit: 2021-04-01 | Discharge: 2021-04-01 | Disposition: A | Discharge status: Home / Self Care | Payer: Medicare Other | Attending: Emergency Medicine | Admitting: Emergency Medicine

## 2021-04-01 ENCOUNTER — Emergency Department (HOSPITAL_BASED_OUTPATIENT_CLINIC_OR_DEPARTMENT_OTHER): Payer: Medicare Other

## 2021-04-01 DIAGNOSIS — I10 Essential (primary) hypertension: Secondary | ICD-10-CM | POA: Diagnosis not present

## 2021-04-01 DIAGNOSIS — Z85828 Personal history of other malignant neoplasm of skin: Secondary | ICD-10-CM | POA: Insufficient documentation

## 2021-04-01 DIAGNOSIS — I493 Ventricular premature depolarization: Secondary | ICD-10-CM | POA: Diagnosis not present

## 2021-04-01 DIAGNOSIS — Z7982 Long term (current) use of aspirin: Secondary | ICD-10-CM | POA: Diagnosis not present

## 2021-04-01 DIAGNOSIS — R009 Unspecified abnormalities of heart beat: Secondary | ICD-10-CM | POA: Diagnosis present

## 2021-04-01 DIAGNOSIS — Z7984 Long term (current) use of oral hypoglycemic drugs: Secondary | ICD-10-CM | POA: Insufficient documentation

## 2021-04-01 DIAGNOSIS — I499 Cardiac arrhythmia, unspecified: Secondary | ICD-10-CM | POA: Diagnosis not present

## 2021-04-01 DIAGNOSIS — E119 Type 2 diabetes mellitus without complications: Secondary | ICD-10-CM | POA: Diagnosis not present

## 2021-04-01 DIAGNOSIS — Z79899 Other long term (current) drug therapy: Secondary | ICD-10-CM | POA: Diagnosis not present

## 2021-04-01 LAB — URINALYSIS, ROUTINE W REFLEX MICROSCOPIC
Bilirubin Urine: NEGATIVE
Glucose, UA: 500 mg/dL — AB
Hgb urine dipstick: NEGATIVE
Ketones, ur: NEGATIVE mg/dL
Leukocytes,Ua: NEGATIVE
Nitrite: NEGATIVE
Specific Gravity, Urine: 1.022 (ref 1.005–1.030)
pH: 5.5 (ref 5.0–8.0)

## 2021-04-01 LAB — BASIC METABOLIC PANEL
Anion gap: 9 (ref 5–15)
BUN: 17 mg/dL (ref 8–23)
CO2: 28 mmol/L (ref 22–32)
Calcium: 9.5 mg/dL (ref 8.9–10.3)
Chloride: 102 mmol/L (ref 98–111)
Creatinine, Ser: 1.13 mg/dL (ref 0.61–1.24)
GFR, Estimated: >60 mL/min (ref >60)
Glucose, Bld: 156 mg/dL — ABNORMAL HIGH (ref 70–99)
Potassium: 3.6 mmol/L (ref 3.5–5.1)
Sodium: 139 mmol/L (ref 135–145)

## 2021-04-01 LAB — CBC
HCT: 44.1 % (ref 39.0–52.0)
Hemoglobin: 15.3 g/dL (ref 13.0–17.0)
MCH: 30.8 pg (ref 26.0–34.0)
MCHC: 34.7 g/dL (ref 30.0–36.0)
MCV: 88.7 fL (ref 80.0–100.0)
Platelets: 188 10*3/uL (ref 150–400)
RBC: 4.97 MIL/uL (ref 4.22–5.81)
RDW: 13.1 % (ref 11.5–15.5)
WBC: 7.7 10*3/uL (ref 4.0–10.5)
nRBC: 0 % (ref 0.0–0.2)

## 2021-04-01 LAB — TSH: TSH: 0.995 u[IU]/mL (ref 0.350–4.500)

## 2021-04-01 LAB — TROPONIN I (HIGH SENSITIVITY): Troponin I (High Sensitivity): 7 ng/L (ref <18)

## 2021-04-01 LAB — MAGNESIUM: Magnesium: 2.1 mg/dL (ref 1.7–2.4)

## 2021-04-01 NOTE — ED Triage Notes (Signed)
Pt reports that he has been trouble with his heart skipping for years and MD is aware and has no explanation. Pt states that he feels like his skipping has increased today , was walking in the store, felt dizzy/weak like he was going to pass out.

## 2021-04-01 NOTE — ED Notes (Signed)
Dc pt home with wife. Verbalized understanding of dc instructions.

## 2021-04-01 NOTE — ED Provider Notes (Signed)
Signout note  70 year old male with palpitations.  Here patient appears well in no distress, EKG noted for PVCs but in sinus rhythm.  Basic labs ordered.  Anticipate discharge with outpatient referral if basic labs are normal and patient remains stable.  3:08 PM received sign out from haviland  3:58 PM patient has no ongoing symptoms, all of his laboratory work including electrolytes, CBC, troponin were within normal limits.  TSH is pending.  Patient appears to be in sinus rhythm based on my review of the cardiac monitor.  Believe he is stable for discharge and outpatient management.  Discussed recommendations for follow-up with primary and cardiology.  Instructed to check MyChart for TSH results.  Discharged.   Lucrezia Starch, MD 04/01/21 [phone]

## 2021-04-01 NOTE — ED Provider Notes (Signed)
[facility] EMERGENCY DEPT Provider Note   CSN: [id number] Arrival date & time: 04/01/21  1409     History Chief Complaint  Patient presents with   Irregular Heart Beat    [name] is a 70 y.o. male.  Pt presents to the ED today with an irregular heart beat.  Pt said he has had problems with his hear skipping for years, but no one knows what it is.  Today, he was at the store and felt like he was going to pass out.  He said the sensation lasted just a few seconds, but then went away.  He felt his palpitations worsen.  Pt denies any cp or sob.        Past Medical History:  Diagnosis Date   Arthritis    Atypical small acinar proliferation of prostate    Benign paroxysmal positional vertigo    Diabetes mellitus without complication (HCC)    Elevated PSA    Essential hypertension, benign    GERD (gastroesophageal reflux disease)    History of basal cell carcinoma excision    few times   History of hiatal hernia    History of kidney stones    History of melanoma excision    x1  localized   History of small bowel obstruction    2009  &  2013--  both times resolved without surgical intervevtion   Hyperlipidemia    LAFB (left anterior fascicular block)    Prediabetes     Patient Active Problem List   Diagnosis Date Noted   Controlled type 2 diabetes mellitus with complication, without long-term current use of insulin ([place]) 10/24/2020   BMI 31.0-31.9,adult 04/01/2018   Vitamin D deficiency 04/01/2018   Elevated PSA 03/19/2016   Pre-diabetes 03/18/2014   Partial small bowel obstruction ([place]) 03/22/2012   Hypertension    Mixed hyperlipidemia    Reflux    Vertigo, intermittent    Hypogonadism in male     Past Surgical History:  Procedure Laterality Date   GASTRIC FUNDOPLICATION  9407'W   LUMBAR DISC SURGERY  x3   last one 1980's   PROSTATE BIOPSY N/A 11/21/2015   Procedure: PROSTATE BIOPSY AND ULTRASOUND;  Surgeon: Irine Seal, MD;  Location: [facility];  Service: Urology;  Laterality: N/A;       Family History  Problem Relation Age of Onset   Hypertension Mother    Melanoma Mother    Hypertension Father    Diabetes Father     Social History   Tobacco Use   Smoking status: Never   Smokeless tobacco: Never  Vaping Use   Vaping Use: Never used  Substance Use Topics   Alcohol use: Yes    Comment: rare   Drug use: No    Home Medications Prior to Admission medications   Medication Sig Start Date End Date Taking? Authorizing Provider  amLODipine (NORVASC) 5 MG tablet Take 1 & 1/2 tablets in the morning 10/24/20  Yes Stacks, Cletus Gash, MD  aspirin 81 MG tablet Take 81 mg by mouth daily.   Yes [provider]  Cholecalciferol (VITAMIN D) 2000 UNITS CAPS Take 1 capsule by mouth daily.    Yes [provider]  lisinopril (ZESTRIL) 20 MG tablet Take 1 tablet (20 mg total) by mouth daily. 10/24/20  Yes Claretta Fraise, MD  metFORMIN (GLUCOPHAGE) 500 MG tablet Take 1 tablet daily with breakfast 10/24/20  Yes Stacks, Cletus Gash, MD  metoprolol succinate (TOPROL-XL) 100 MG 24 hr tablet  Take with or immediately following a meal. 10/24/20  Yes Stacks, Cletus Gash, MD  pravastatin (PRAVACHOL) 20 MG tablet Take 1 tablet (20 mg total) by mouth daily. 10/24/20  Yes Claretta Fraise, MD  blood glucose meter kit and supplies KIT Dispense based on patient and insurance preference. Use up to four times daily as directed. (FOR ICD-10 : E11.9 10/27/19   Claretta Fraise, MD  glucose blood test strip Use as instructed 12/15/19   Claretta Fraise, MD    Allergies    Penicillins  Review of Systems   Review of Systems  Cardiovascular:  Positive for palpitations.  All other systems reviewed and are negative.  Physical Exam Updated Vital Signs BP (!) 152/79 (BP Location: Left Arm)   Pulse 72   Temp 98.4 F (36.9 C) (Oral)   Resp 15   SpO2 96%   Physical Exam Vitals and nursing note reviewed.  Constitutional:      Appearance:  Normal appearance.  HENT:     Head: Normocephalic and atraumatic.     Right Ear: External ear normal.     Left Ear: External ear normal.     Nose: Nose normal.     Mouth/Throat:     Mouth: Mucous membranes are moist.     Pharynx: Oropharynx is clear.  Eyes:     Extraocular Movements: Extraocular movements intact.     Conjunctiva/sclera: Conjunctivae normal.     Pupils: Pupils are equal, round, and reactive to light.  Cardiovascular:     Rate and Rhythm: Normal rate. Rhythm irregular.     Pulses: Normal pulses.     Heart sounds: Normal heart sounds.     Comments: Frequent PVCs on the monitor. Pulmonary:     Effort: Pulmonary effort is normal.     Breath sounds: Normal breath sounds.  Abdominal:     General: Abdomen is flat. Bowel sounds are normal.     Palpations: Abdomen is soft.  Musculoskeletal:        General: Normal range of motion.     Cervical back: Normal range of motion and neck supple.  Skin:    General: Skin is warm.     Capillary Refill: Capillary refill takes less than 2 seconds.  Neurological:     General: No focal deficit present.     Mental Status: He is alert and oriented to person, place, and time.  Psychiatric:        Mood and Affect: Mood normal.        Behavior: Behavior normal.        Thought Content: Thought content normal.        Judgment: Judgment normal.    ED Results / Procedures / Treatments   Labs (all labs ordered are listed, but only abnormal results are displayed) Labs Reviewed  BASIC METABOLIC PANEL  MAGNESIUM  CBC  TSH  URINALYSIS, ROUTINE W REFLEX MICROSCOPIC  TROPONIN I (HIGH SENSITIVITY)    EKG EKG Interpretation  Date/Time:  Sunday April 01 2021 14:22:45 EDT Ventricular Rate:  74 PR Interval:  175 QRS Duration: 113 QT Interval:  409 QTC Calculation: 454 R Axis:   -26 Text Interpretation: Sinus rhythm Ventricular premature complex Borderline intraventricular conduction delay Abnormal R-wave progression, late transition  Nonspecific T abnormalities, inferior leads PVCs are new Confirmed by Isla Pence [phone]) on 04/01/2021 2:46:43 PM  Radiology No results found.  Procedures Procedures   Medications Ordered in ED Medications - No data to display  ED Course  I have reviewed  the triage vital signs and the nursing notes.  Pertinent labs & imaging results that were available during my care of the patient were reviewed by me and considered in my medical decision making (see chart for details).    MDM Rules/Calculators/A&P                          Labs are pending at shift change.  Pt will be referred to [facility] (this is where he lives).  Pt signed out to Dr. Roslynn Amble at shift change.  I anticipate d/c unless something is abnormal on labs.  Final Clinical Impression(s) / ED Diagnoses Final diagnoses:  PVCs (premature ventricular contractions)    Rx / DC Orders ED Discharge Orders          Ordered    Ambulatory referral to Cardiology        04/01/21 1448             Isla Pence, MD 04/01/21 1450

## 2021-04-01 NOTE — Discharge Instructions (Addendum)
Please follow-up with cardiology and your primary doctor.  Discuss getting a longer-term heart monitor set up.  If you develop worsening palpitations, any chest pain, difficulty breathing, episodes of passing out or other new concerning symptom, come back to ER immediately for reassessment.  Please check MyChart to review the results of your thyroid testing (TSH).  Please discuss results with your primary doctor or cardiologist.

## 2021-04-04 DIAGNOSIS — X32XXXD Exposure to sunlight, subsequent encounter: Secondary | ICD-10-CM | POA: Diagnosis not present

## 2021-04-04 DIAGNOSIS — L570 Actinic keratosis: Secondary | ICD-10-CM | POA: Diagnosis not present

## 2021-04-24 ENCOUNTER — Other Ambulatory Visit: Payer: Self-pay | Admitting: Family Medicine

## 2021-04-24 DIAGNOSIS — I10 Essential (primary) hypertension: Secondary | ICD-10-CM

## 2021-05-04 ENCOUNTER — Other Ambulatory Visit: Payer: Self-pay | Admitting: Family Medicine

## 2021-05-04 DIAGNOSIS — I10 Essential (primary) hypertension: Secondary | ICD-10-CM

## 2021-05-04 DIAGNOSIS — E118 Type 2 diabetes mellitus with unspecified complications: Secondary | ICD-10-CM

## 2021-05-09 ENCOUNTER — Other Ambulatory Visit: Payer: Self-pay | Admitting: Family Medicine

## 2021-05-24 ENCOUNTER — Ambulatory Visit: Payer: Medicare Other | Admitting: Internal Medicine

## 2021-05-24 ENCOUNTER — Other Ambulatory Visit: Payer: Self-pay

## 2021-05-24 ENCOUNTER — Encounter: Payer: Self-pay | Admitting: Internal Medicine

## 2021-05-24 ENCOUNTER — Other Ambulatory Visit: Payer: Self-pay | Admitting: Internal Medicine

## 2021-05-24 ENCOUNTER — Ambulatory Visit (INDEPENDENT_AMBULATORY_CARE_PROVIDER_SITE_OTHER): Payer: Medicare Other

## 2021-05-24 VITALS — BP 140/80 | HR 64 | Ht 73.0 in | Wt 230.0 lb

## 2021-05-24 DIAGNOSIS — R002 Palpitations: Secondary | ICD-10-CM

## 2021-05-24 NOTE — Patient Instructions (Signed)
Medication Instructions:  Your physician recommends that you continue on your current medications as directed. Please refer to the Current Medication list given to you today.  *If you need a refill on your cardiac medications before your next appointment, please call your pharmacy*   Lab Work: NONE   If you have labs (blood work) drawn today and your tests are completely normal, you will receive your results only by: [place] (if you have MyChart) OR A paper copy in the mail If you have any lab test that is abnormal or we need to change your treatment, we will call you to review the results.   Testing/Procedures: [facility] [place]- Long Term Monitor Instructions   Your physician has requested you wear your ZIO patch monitor___3____days.   This is a single patch monitor.  [employer] supplies one patch monitor per enrollment.  Additional stickers are not available.   Please do not apply patch if you will be having a Nuclear Stress Test, Echocardiogram, Cardiac CT, MRI, or Chest Xray during the time frame you would be wearing the monitor. The patch cannot be worn during these tests.  You cannot remove and re-apply the ZIO XT patch monitor.   Your ZIO patch monitor will be sent USPS Priority mail from [facility] directly to your home address. The monitor may also be mailed to a PO BOX if home delivery is not available.   It may take 3-5 days to receive your monitor after you have been enrolled.   Once you have received you monitor, please review enclosed instructions.  Your monitor has already been registered assigning a specific monitor serial # to you.   Applying the monitor   Shave hair from upper left chest.   Hold abrader disc by orange tab.  Rub abrader in 40 strokes over left upper chest as indicated in your monitor instructions.   Clean area with 4 enclosed alcohol pads .  Use all pads to assure are is cleaned thoroughly.  Let dry.   Apply patch as indicated in monitor  instructions.  Patch will be place under collarbone on left side of chest with arrow pointing upward.   Rub patch adhesive wings for 2 minutes.Remove white label marked "1".  Remove white label marked "2".  Rub patch adhesive wings for 2 additional minutes.   While looking in a mirror, press and release button in center of patch.  A small green light will flash 3-4 times .  This will be your only indicator the monitor has been turned on.     Do not shower for the first 24 hours.  You may shower after the first 24 hours.   Press button if you feel a symptom. You will hear a small click.  Record Date, Time and Symptom in the Patient Log Book.   When you are ready to remove patch, follow instructions on last 2 pages of Patient Log Book.  Stick patch monitor onto last page of Patient Log Book.   Place Patient Log Book in [place] box.  Use locking tab on box and tape box closed securely.  The [employer] has [employer] on it.  Please place in mailbox as soon as possible.  Your physician should have your test results approximately 7 days after the monitor has been mailed back to [facility].   Call [place] at [phone] if you have questions regarding your ZIO XT patch monitor.  Call them immediately if you see an orange  light blinking on your monitor.   If your monitor falls off in less than 4 days contact our Monitor department at [phone].  If your monitor becomes loose or falls off after 4 days call [employer] at [phone] for suggestions on securing your monitor.     Follow-Up: At [facility], you and your health needs are our priority.  As part of our continuing mission to provide you with exceptional heart care, we have created designated Provider Care Teams.  These Care Teams include your primary Cardiologist (physician) and Advanced Practice Providers (APPs -  Physician Assistants and Nurse Practitioners) who all work together to provide you with  the care you need, when you need it.  We recommend signing up for the patient portal called "MyChart".  Sign up information is provided on this After Visit Summary.  MyChart is used to connect with patients for Virtual Visits (Telemedicine).  Patients are able to view lab/test results, encounter notes, upcoming appointments, etc.  Non-urgent messages can be sent to your provider as well.   To learn more about what you can do with MyChart, go to [email or web address].    Your next appointment:    To Be Determined   The format for your next appointment:   In Person  Provider:   Dorris Carnes, MD   Other Instructions Thank you for choosing [facility]!

## 2021-05-24 NOTE — Progress Notes (Signed)
Cardiology Office Note   Date:  05/24/2021   ID:  Virat Prather, DOB [birth year], MRN [id number]  PCP:  Claretta Fraise, MD  Cardiologist:   Dorris Carnes, MD   Pt referred for evaluation of palpitations   History of Present Illness: [name] is a 70 y.o. male with a history of palpitations   Recently seen in ED   The pt says that he has had skips for years   Seems to be more frequent  Denies dizziness   No SOB   no CP     No change in ability to do things  Remains active     CT in 2020 showed coronary calcifications       Current Meds  Medication Sig   amLODipine (NORVASC) 5 MG tablet Take 1 & 1/2 tablets in the morning   aspirin 81 MG tablet Take 81 mg by mouth daily.   blood glucose meter kit and supplies KIT Dispense based on patient and insurance preference. Use up to four times daily as directed. (FOR ICD-10 : E11.9   Cholecalciferol (VITAMIN D) 2000 UNITS CAPS Take 1 capsule by mouth daily.    glucose blood test strip Use as instructed   lisinopril (ZESTRIL) 20 MG tablet TAKE 1 TABLET DAILY   metFORMIN (GLUCOPHAGE) 500 MG tablet TAKE (1) TABLET DAILY WITH BREAKFAST.(NEEDS TO BE SEEN BEFORE NEXT REFILL)   metoprolol succinate (TOPROL-XL) 100 MG 24 hr tablet TAKE 1 TABLET ONCE A DAY WITH A MEAL (NEEDS TO BE SEEN BEFORE NEXT REFILL)   pravastatin (PRAVACHOL) 20 MG tablet Take 1 tablet (20 mg total) by mouth daily.     Allergies:   Penicillins   Past Medical History:  Diagnosis Date   Arthritis    Atypical small acinar proliferation of prostate    Benign paroxysmal positional vertigo    Diabetes mellitus without complication (HCC)    Elevated PSA    Essential hypertension, benign    GERD (gastroesophageal reflux disease)    History of basal cell carcinoma excision    few times   History of hiatal hernia    History of kidney stones    History of melanoma excision    x1  localized   History of small bowel obstruction    2009  &  2013--  both times resolved without  surgical intervevtion   Hyperlipidemia    LAFB (left anterior fascicular block)    Prediabetes     Past Surgical History:  Procedure Laterality Date   GASTRIC FUNDOPLICATION  9357'S   LUMBAR DISC SURGERY  x3   last one 1980's   PROSTATE BIOPSY N/A 11/21/2015   Procedure: PROSTATE BIOPSY AND ULTRASOUND;  Surgeon: Irine Seal, MD;  Location: [facility];  Service: Urology;  Laterality: N/A;     Social History:  The patient  reports that he has never smoked. He has never used smokeless tobacco. He reports current alcohol use. He reports that he does not use drugs.   Family History:  The patient's family history includes Diabetes in his father; Hypertension in his father and mother; Melanoma in his mother.    ROS:  Please see the history of present illness. All other systems are reviewed and  Negative to the above problem except as noted.    PHYSICAL EXAM: VS:  BP 140/80 (BP Location: Left Arm, Patient Position: Sitting, Cuff Size: Normal)   Pulse 64   Ht $R'6\' 1"'FU$  (1.854 m)   Wt 230 lb (104.3  kg)   SpO2 97%   BMI 30.34 kg/m   GEN: Well nourished, well developed, in no acute distress  HEENT: normal  Neck: no JVD, carotid bruits, or masses Cardiac: RRR; no murmurs  no LE  edema  Respiratory:  clear to auscultation bilaterally,  GI: soft, nontender, nondistended, + BS  No hepatomegaly  MS: no deformity Moving all extremities   Skin: warm and dry, no rash Neuro:  Strength and sensation are intact Psych: euthymic mood, full affect   EKG:  EKG is rnot ordered today   On 04/01/21 SR 73 bpm  Occasional PVC  nonspecific ST changes     Lipid Panel    Component Value Date/Time   CHOL 132 08/29/2020 0917   TRIG 123 08/29/2020 0917   TRIG 105 09/17/2016 0903   HDL 47 08/29/2020 0917   HDL 42 09/17/2016 0903   CHOLHDL 2.8 08/29/2020 0917   LDLCALC 63 08/29/2020 0917   LDLCALC 44 02/28/2014 1154      Wt Readings from Last 3 Encounters:  05/24/21 230 lb (104.3 kg)   03/16/21 230 lb 12.8 oz (104.7 kg)  03/09/21 231 lb (104.8 kg)      ASSESSMENT AND PLAN:  1  Palpitations  Pt notices them do not appear to be hemodynamically destabilizing     I would recomm a monitor to define, evaluate burden   Furhter testing based on results He is taking metorpolol   If he has a bad day, could take an extra 1/2 Avoid/limit stimulants.    Stay hydrated  2  CAD  Commented on CT of abdomen from 2020   No symtpoms of angina   Follow  3  HL   Continue statin     Lipids under good control  4  HTN   Fair BP control   I would not make changes now   He says higher in office  Discussed goals   Current medicines are reviewed at length with the patient today.  The patient does not have concerns regarding medicines.  Signed, Dorris Carnes, MD  05/24/2021 3:08 PM    [facility] [address], [place], [place]  [address] Phone: [phone]; Fax: [phone]

## 2021-05-26 ENCOUNTER — Other Ambulatory Visit: Payer: Self-pay | Admitting: Family Medicine

## 2021-05-26 DIAGNOSIS — I10 Essential (primary) hypertension: Secondary | ICD-10-CM

## 2021-05-30 DIAGNOSIS — R002 Palpitations: Secondary | ICD-10-CM | POA: Diagnosis not present

## 2021-06-06 ENCOUNTER — Other Ambulatory Visit: Payer: Self-pay | Admitting: Family Medicine

## 2021-06-06 MED ORDER — METFORMIN HCL 500 MG PO TABS: ORAL_TABLET | ORAL | 0 refills | Status: DC

## 2021-06-06 NOTE — Telephone Encounter (Signed)
Stacks. NTBS 30 days given 05/10/21

## 2021-06-06 NOTE — Addendum Note (Signed)
Addended by: Antonietta Barcelona D on: 06/06/2021 01:28 PM   Modules accepted: Orders

## 2021-06-25 ENCOUNTER — Other Ambulatory Visit: Payer: Self-pay | Admitting: Family Medicine

## 2021-06-25 DIAGNOSIS — I10 Essential (primary) hypertension: Secondary | ICD-10-CM

## 2021-07-07 ENCOUNTER — Other Ambulatory Visit: Payer: Self-pay | Admitting: Family Medicine

## 2021-07-21 ENCOUNTER — Other Ambulatory Visit: Payer: Self-pay | Admitting: Family Medicine

## 2021-07-21 DIAGNOSIS — I10 Essential (primary) hypertension: Secondary | ICD-10-CM

## 2021-07-23 ENCOUNTER — Encounter: Payer: Self-pay | Admitting: Family Medicine

## 2021-07-23 ENCOUNTER — Other Ambulatory Visit: Payer: Self-pay | Admitting: *Deleted

## 2021-07-23 ENCOUNTER — Other Ambulatory Visit: Payer: Self-pay

## 2021-07-23 ENCOUNTER — Ambulatory Visit (INDEPENDENT_AMBULATORY_CARE_PROVIDER_SITE_OTHER): Payer: Medicare Other | Admitting: Family Medicine

## 2021-07-23 VITALS — BP 143/73 | HR 62 | Temp 98.0°F | Ht 73.0 in | Wt 231.2 lb

## 2021-07-23 DIAGNOSIS — E782 Mixed hyperlipidemia: Secondary | ICD-10-CM

## 2021-07-23 DIAGNOSIS — Z23 Encounter for immunization: Secondary | ICD-10-CM

## 2021-07-23 DIAGNOSIS — E559 Vitamin D deficiency, unspecified: Secondary | ICD-10-CM

## 2021-07-23 DIAGNOSIS — E118 Type 2 diabetes mellitus with unspecified complications: Secondary | ICD-10-CM

## 2021-07-23 DIAGNOSIS — I10 Essential (primary) hypertension: Secondary | ICD-10-CM

## 2021-07-23 LAB — BAYER DCA HB A1C WAIVED: HB A1C (BAYER DCA - WAIVED): 6.6 % — ABNORMAL HIGH (ref 4.8–5.6)

## 2021-07-23 MED ORDER — METFORMIN HCL 500 MG PO TABS: ORAL_TABLET | ORAL | 2 refills | Status: DC

## 2021-07-23 MED ORDER — METOPROLOL SUCCINATE ER 100 MG PO TB24: ORAL_TABLET | ORAL | 2 refills | Status: DC

## 2021-07-23 MED ORDER — AMLODIPINE BESYLATE 5 MG PO TABS: ORAL_TABLET | ORAL | 2 refills | Status: DC

## 2021-07-23 NOTE — Patient Instructions (Signed)
Hypoglycemia Hypoglycemia occurs when the level of sugar (glucose) in the blood is too low. Hypoglycemia can happen in people who have or do not have diabetes. It can develop quickly, and it can be a medical emergency. For most people, a blood glucose level below 70 mg/dL (3.9 mmol/L) is considered hypoglycemia. Glucose is a type of sugar that provides the body's main source of energy. Certain hormones (insulin and glucagon) control the level of glucose in the blood. Insulin lowers blood glucose, and glucagon raises blood glucose. Hypoglycemia can result from having too much insulin in the bloodstream, or from not eating enough food that contains glucose. You may also have reactive hypoglycemia, which happens within 4 hours after eating a meal. What are the causes? Hypoglycemia occurs most often in people who have diabetes and may be caused by: Diabetes medicine. Not eating enough, or not eating often enough. Increased physical activity. Drinking alcohol on an empty stomach. If you do not have diabetes, hypoglycemia may be caused by: A tumor in the pancreas. Not eating enough, or not eating for long periods at a time (fasting). A severe infection or illness. Problems after having bariatric surgery. Organ failure, such as kidney or liver failure. Certain medicines. What increases the risk? Hypoglycemia is more likely to develop in people who: Have diabetes and take medicines to lower blood glucose. Abuse alcohol. Have a severe illness. What are the signs or symptoms? Symptoms vary depending on whether the condition is mild, moderate, or severe. Mild hypoglycemia Hunger. Sweating and feeling clammy. Dizziness or feeling light-headed. Sleepiness or restless sleep. Nausea. Increased heart rate. Headache. Blurry vision. Mood changes, such as irritability or anxiety. Tingling or numbness around the mouth, lips, or tongue. Moderate hypoglycemia Confusion and poor judgment. Behavior  changes. Weakness. Irregular heartbeat. A change in coordination. Severe hypoglycemia Severe hypoglycemia is a medical emergency. It can cause: Fainting. Seizures. Loss of consciousness (coma). Death. How is this diagnosed? Hypoglycemia is diagnosed with a blood test to measure your blood glucose level. This blood test is done while you are having symptoms. Your health care provider may also do a physical exam and review your medical history. How is this treated? This condition can be treated by immediately eating or drinking something that contains sugar with 15 grams of fast-acting carbohydrate, such as: 4 oz (120 mL) of fruit juice. 4 oz (120 mL) of regular soda (not diet soda). Several pieces of hard candy. Check food labels to find out how many pieces to eat for 15 grams. 1 Tbsp (15 mL) of sugar or honey. 4 glucose tablets. 1 tube of glucose gel. Treating hypoglycemia if you have diabetes If you are alert and able to swallow safely, follow the 15:15 rule: Take 15 grams of a fast-acting carbohydrate. Talk with your health care provider about how much you should take. Options for getting 15 grams of fast-acting carbohydrate include: Glucose tablets (take 4 tablets). Several pieces of hard candy. Check food labels to find out how many pieces to eat for 15 grams. 4 oz (120 mL) of fruit juice. 4 oz (120 mL) of regular soda (not diet soda). 1 Tbsp (15 mL) of sugar or honey. 1 tube of glucose gel. Check your blood glucose 15 minutes after you take the carbohydrate. If the repeat blood glucose level is still at or below 70 mg/dL (3.9 mmol/L), take 15 grams of a carbohydrate again. If your blood glucose level does not increase above 70 mg/dL (3.9 mmol/L) after 3 tries, seek emergency  medical care. After your blood glucose level returns to normal, eat a meal or a snack within 1 hour.  Treating severe hypoglycemia Severe hypoglycemia is when your blood glucose level is below 54 mg/dL (3  mmol/L). Severe hypoglycemia is a medical emergency. Get medical help right away. If you have severe hypoglycemia and you cannot eat or drink, you will need to be given glucagon. A family member or close friend should learn how to check your blood glucose and how to give you glucagon. Ask your health care provider if you need to have an emergency glucagon kit available. Severe hypoglycemia may need to be treated in a hospital. The treatment may include getting glucose through an IV. You may also need treatment for the cause of your hypoglycemia. Follow these instructions at home: General instructions Take over-the-counter and prescription medicines only as told by your health care provider. Monitor your blood glucose as told by your health care provider. If you drink alcohol: Limit how much you have to: 0-1 drink a day for women who are not pregnant. 0-2 drinks a day for men. Know how much alcohol is in your drink. In the [place] one drink equals one 12 oz bottle of beer (355 mL), one 5 oz glass of wine (148 mL), or one 1 oz glass of hard liquor (44 mL). Be sure to eat food along with drinking alcohol. Be aware that alcohol is absorbed quickly and may have lingering effects that may result in hypoglycemia later. Be sure to do ongoing glucose monitoring. Keep all follow-up visits. This is important. If you have diabetes: Always have a fast-acting carbohydrate (15 grams) option with you to treat low blood glucose. Follow your diabetes management plan as directed by your health care provider. Make sure you: Know the symptoms of hypoglycemia. It is important to treat it right away to prevent it from becoming severe. Check your blood glucose as often as told. Always check before and after exercise. Always check your blood glucose before you drive a motorized vehicle. Take your medicines as told. Follow your meal plan. Eat on time, and do not skip meals. Share your diabetes management plan with  people in your workplace, school, and household. Carry a medical alert card or wear medical alert jewelry. Where to find more information American Diabetes Association: www.diabetes.org Contact a health care provider if: You have problems keeping your blood glucose in your target range. You have frequent episodes of hypoglycemia. Get help right away if: You continue to have hypoglycemia symptoms after eating or drinking something that contains 15 grams of fast-acting carbohydrate, and you cannot get your blood glucose above 70 mg/dL (3.9 mmol/L) while following the 15:15 rule. Your blood glucose is below 54 mg/dL (3 mmol/L). You have a seizure. You faint. These symptoms may represent a serious problem that is an emergency. Do not wait to see if the symptoms will go away. Get medical help right away. Call your local emergency services (911 in the [place] Do not drive yourself to the hospital. Summary Hypoglycemia occurs when the level of sugar (glucose) in the blood is too low. Hypoglycemia can happen in people who have or do not have diabetes. It can develop quickly, and it can be a medical emergency. Make sure you know the symptoms of hypoglycemia and how to treat it. Always have a fast-acting carbohydrate option with you to treat low blood sugar. This information is not intended to replace advice given to you by your health care provider. Make   sure you discuss any questions you have with your health care provider. Document Revised: 08/17/2020 Document Reviewed: 08/17/2020 Elsevier Patient Education  2022 [employer].

## 2021-07-23 NOTE — Progress Notes (Signed)
  Subjective:  Patient ID: [name],  male    DOB: 08/01/1951  Age: 70 y.o.    CC: Medical Management of Chronic Issues   HPI [name] presents for  follow-up of hypertension. Patient has no history of headache chest pain or shortness of breath or recent cough. Patient also denies symptoms of TIA such as numbness weakness lateralizing. Patient denies side effects from medication. States taking it regularly.  Patient also  in for follow-up of elevated cholesterol. Doing well without complaints on current medication. Denies side effects  including myalgia and arthralgia and nausea. Also in today for liver function testing. Currently no chest pain, shortness of breath or other cardiovascular related symptoms noted.  Follow-up of diabetes. Patient does not check blood sugar at home. Patient denies symptoms such as excessive hunger or urinary frequency, excessive hunger, nausea No significant hypoglycemic spells noted. Medications reviewed. Pt reports taking them regularly. Pt. denies complication/adverse reaction today.    History [name] has a past medical history of Arthritis, Atypical small acinar proliferation of prostate, Benign paroxysmal positional vertigo, Diabetes mellitus without complication (HCC), Elevated PSA, Essential hypertension, benign, GERD (gastroesophageal reflux disease), History of basal cell carcinoma excision, History of hiatal hernia, History of kidney stones, History of melanoma excision, History of small bowel obstruction, Hyperlipidemia, LAFB (left anterior fascicular block), and Prediabetes.   He has a past surgical history that includes Lumbar disc surgery (x3   last one 1980's); Gastric fundoplication (1970's); and Prostate biopsy (N/A, 11/21/2015).   His family history includes Diabetes in his father; Hypertension in his father and mother; Melanoma in his mother.He reports that he has never smoked. He has never used smokeless tobacco. He reports current  alcohol use. He reports that he does not use drugs.  Current Outpatient Medications on File Prior to Visit  Medication Sig Dispense Refill   aspirin 81 MG tablet Take 81 mg by mouth daily.     blood glucose meter kit and supplies KIT Dispense based on patient and insurance preference. Use up to four times daily as directed. (FOR ICD-10 : E11.9 1 each 0   Cholecalciferol (VITAMIN D) 2000 UNITS CAPS Take 1 capsule by mouth daily.      glucose blood test strip Use as instructed 100 each 12   lisinopril (ZESTRIL) 20 MG tablet TAKE 1 TABLET DAILY 90 tablet 1   pravastatin (PRAVACHOL) 20 MG tablet Take 1 tablet (20 mg total) by mouth daily. 90 tablet 3   No current facility-administered medications on file prior to visit.    ROS Review of Systems  Constitutional:  Negative for fever.  Respiratory:  Negative for shortness of breath.   Cardiovascular:  Positive for palpitations (wore monitor for 3 days with cardiology, no significant abnormality noted.). Negative for chest pain.  Musculoskeletal:  Negative for arthralgias.  Skin:  Negative for rash.  Hematological:  Negative for adenopathy.   Objective:  BP (!) 143/73   Pulse 62   Temp 98 F (36.7 C)   Ht 6' 1" (1.854 m)   Wt 231 lb 3.2 oz (104.9 kg)   SpO2 96%   BMI 30.50 kg/m   BP Readings from Last 3 Encounters:  07/23/21 (!) 143/73  05/24/21 140/80  04/01/21 122/63    Wt Readings from Last 3 Encounters:  07/23/21 231 lb 3.2 oz (104.9 kg)  05/24/21 230 lb (104.3 kg)  03/16/21 230 lb 12.8 oz (104.7 kg)     Physical Exam Vitals reviewed.  Constitutional:        Appearance: He is well-developed.  HENT:     Head: Normocephalic and atraumatic.     Right Ear: External ear normal.     Left Ear: External ear normal.     Mouth/Throat:     Pharynx: No oropharyngeal exudate or posterior oropharyngeal erythema.  Eyes:     Pupils: Pupils are equal, round, and reactive to light.  Cardiovascular:     Rate and Rhythm: Normal rate  and regular rhythm.     Heart sounds: No murmur heard. Pulmonary:     Effort: No respiratory distress.     Breath sounds: Normal breath sounds.  Musculoskeletal:     Cervical back: Normal range of motion and neck supple.  Neurological:     Mental Status: He is alert and oriented to person, place, and time.    Diabetic Foot Exam - Simple   Simple Foot Form Diabetic Foot exam was performed with the following findings: Yes 07/23/2021  4:01 PM  Visual Inspection No deformities, no ulcerations, no other skin breakdown bilaterally: Yes Sensation Testing Intact to touch and monofilament testing bilaterally: Yes Pulse Check Posterior Tibialis and Dorsalis pulse intact bilaterally: Yes Comments       Assessment & Plan:   [name] was seen today for medical management of chronic issues.  Diagnoses and all orders for this visit:  Controlled type 2 diabetes mellitus with complication, without long-term current use of insulin (HCC) -     Bayer DCA Hb A1c Waived -     CBC with Differential/Platelet  Vitamin D deficiency -     VITAMIN D 25 Hydroxy (Vit-D Deficiency, Fractures) -     CBC with Differential/Platelet  Mixed hyperlipidemia -     Lipid panel -     CBC with Differential/Platelet  Hypertension -     CMP14+EGFR -     CBC with Differential/Platelet -     amLODipine (NORVASC) 5 MG tablet; TAKE 1 & 1/2 TABLETS ONCE A DAY IN THE MORNING -     metoprolol succinate (TOPROL-XL) 100 MG 24 hr tablet; Take with or immediately following a meal.  Other orders -     metFORMIN (GLUCOPHAGE) 500 MG tablet; TAKE ONE TABLET ONCE DAILY WITH BREAKFAST  I have changed [name]'s metoprolol succinate. I am also having him maintain his Vitamin D, aspirin, blood glucose meter kit and supplies, glucose blood, pravastatin, lisinopril, amLODipine, and metFORMIN.  Meds ordered this encounter  Medications   amLODipine (NORVASC) 5 MG tablet    Sig: TAKE 1 & 1/2 TABLETS ONCE A DAY IN THE MORNING     Dispense:  135 tablet    Refill:  2   metFORMIN (GLUCOPHAGE) 500 MG tablet    Sig: TAKE ONE TABLET ONCE DAILY WITH BREAKFAST    Dispense:  90 tablet    Refill:  2   metoprolol succinate (TOPROL-XL) 100 MG 24 hr tablet    Sig: Take with or immediately following a meal.    Dispense:  90 tablet    Refill:  2     Follow-up: Return in about 3 months (around 10/23/2021).  Claretta Fraise, M.D.

## 2021-07-24 LAB — CBC WITH DIFFERENTIAL/PLATELET
Basophils Absolute: 0.1 10*3/uL (ref 0.0–0.2)
Basos: 1 %
EOS (ABSOLUTE): 0.1 10*3/uL (ref 0.0–0.4)
Eos: 1 %
Hematocrit: 45.6 % (ref 37.5–51.0)
Hemoglobin: 15.2 g/dL (ref 13.0–17.7)
Immature Grans (Abs): 0 10*3/uL (ref 0.0–0.1)
Immature Granulocytes: 0 %
Lymphocytes Absolute: 1.4 10*3/uL (ref 0.7–3.1)
Lymphs: 26 %
MCH: 30.5 pg (ref 26.6–33.0)
MCHC: 33.3 g/dL (ref 31.5–35.7)
MCV: 92 fL (ref 79–97)
Monocytes Absolute: 0.4 10*3/uL (ref 0.1–0.9)
Monocytes: 7 %
Neutrophils Absolute: 3.5 10*3/uL (ref 1.4–7.0)
Neutrophils: 65 %
Platelets: 181 10*3/uL (ref 150–450)
RBC: 4.98 x10E6/uL (ref 4.14–5.80)
RDW: 12.9 % (ref 11.6–15.4)
WBC: 5.4 10*3/uL (ref 3.4–10.8)

## 2021-07-24 LAB — LIPID PANEL
Chol/HDL Ratio: 2.5 ratio (ref 0.0–5.0)
Cholesterol, Total: 113 mg/dL (ref 100–199)
HDL: 45 mg/dL (ref >39)
LDL Chol Calc (NIH): 52 mg/dL (ref 0–99)
Triglycerides: 79 mg/dL (ref 0–149)
VLDL Cholesterol Cal: 16 mg/dL (ref 5–40)

## 2021-07-24 LAB — CMP14+EGFR
ALT: 12 IU/L (ref 0–44)
AST: 14 IU/L (ref 0–40)
Albumin/Globulin Ratio: 1.5 (ref 1.2–2.2)
Albumin: 4.3 g/dL (ref 3.7–4.7)
Alkaline Phosphatase: 134 IU/L — ABNORMAL HIGH (ref 44–121)
BUN/Creatinine Ratio: 19 (ref 10–24)
BUN: 18 mg/dL (ref 8–27)
Bilirubin Total: 0.6 mg/dL (ref 0.0–1.2)
CO2: 27 mmol/L (ref 20–29)
Calcium: 9.6 mg/dL (ref 8.6–10.2)
Chloride: 104 mmol/L (ref 96–106)
Creatinine, Ser: 0.95 mg/dL (ref 0.76–1.27)
Globulin, Total: 2.9 g/dL (ref 1.5–4.5)
Glucose: 152 mg/dL — ABNORMAL HIGH (ref 70–99)
Potassium: 4.1 mmol/L (ref 3.5–5.2)
Sodium: 143 mmol/L (ref 134–144)
Total Protein: 7.2 g/dL (ref 6.0–8.5)
eGFR: 86 mL/min/{1.73_m2} (ref >59)

## 2021-07-24 LAB — VITAMIN D 25 HYDROXY (VIT D DEFICIENCY, FRACTURES): Vit D, 25-Hydroxy: 57 ng/mL (ref 30.0–100.0)

## 2021-07-24 NOTE — Progress Notes (Signed)
Hello Aleksi,  Your lab result is normal and/or stable.Some minor variations that are not significant are commonly marked abnormal, but do not represent any medical problem for you.  Best regards, Vester Balthazor, M.D.

## 2021-08-28 ENCOUNTER — Ambulatory Visit (INDEPENDENT_AMBULATORY_CARE_PROVIDER_SITE_OTHER): Payer: Medicare Other

## 2021-08-28 VITALS — Ht 73.0 in | Wt 219.0 lb

## 2021-08-28 DIAGNOSIS — Z0000 Encounter for general adult medical examination without abnormal findings: Secondary | ICD-10-CM | POA: Diagnosis not present

## 2021-08-28 NOTE — Patient Instructions (Signed)
[name] , Thank you for taking time to come for your Medicare Wellness Visit. I appreciate your ongoing commitment to your health goals. Please review the following plan we discussed and let me know if I can assist you in the future.   Screening recommendations/referrals: Colonoscopy: Done 04/14/2017 - Repeat in 10 years Recommended yearly ophthalmology/optometry visit for glaucoma screening and checkup Recommended yearly dental visit for hygiene and checkup  Vaccinations: Influenza vaccine: Done 07/23/2021 - Repeat annually  Pneumococcal vaccine: Done 07/07/2014 & 10/10/2017 Tdap vaccine: Done 03/09/2021 -Repeat in 10 years  Shingles vaccine: Done 07/06/2019 & 09/08/2019   Covid-19: 4 vaccines completed, last one 05/01/2021 - please bring a copy of vaccine card to your next appointment  Advanced directives: Advance directive discussed with you today. Even though you declined this today, please call our office should you change your mind, and we can give you the proper paperwork for you to fill out.   Conditions/risks identified: Aim for 30 minutes of exercise or brisk walking each day, drink 6-8 glasses of water and eat lots of fruits and vegetables.   Next appointment: Follow up in one year for your annual wellness visit.   Preventive Care 60 Years and Older, Male  Preventive care refers to lifestyle choices and visits with your health care provider that can promote health and wellness. What does preventive care include? A yearly physical exam. This is also called an annual well check. Dental exams once or twice a year. Routine eye exams. Ask your health care provider how often you should have your eyes checked. Personal lifestyle choices, including: Daily care of your teeth and gums. Regular physical activity. Eating a healthy diet. Avoiding tobacco and drug use. Limiting alcohol use. Practicing safe sex. Taking low doses of aspirin every day. Taking vitamin and mineral supplements  as recommended by your health care provider. What happens during an annual well check? The services and screenings done by your health care provider during your annual well check will depend on your age, overall health, lifestyle risk factors, and family history of disease. Counseling  Your health care provider may ask you questions about your: Alcohol use. Tobacco use. Drug use. Emotional well-being. Home and relationship well-being. Sexual activity. Eating habits. History of falls. Memory and ability to understand (cognition). Work and work Statistician. Screening  You may have the following tests or measurements: Height, weight, and BMI. Blood pressure. Lipid and cholesterol levels. These may be checked every 5 years, or more frequently if you are over 30 years old. Skin check. Lung cancer screening. You may have this screening every year starting at age 37 if you have a 30-pack-year history of smoking and currently smoke or have quit within the past 15 years. Fecal occult blood test (FOBT) of the stool. You may have this test every year starting at age 34. Flexible sigmoidoscopy or colonoscopy. You may have a sigmoidoscopy every 5 years or a colonoscopy every 10 years starting at age 56. Prostate cancer screening. Recommendations will vary depending on your family history and other risks. Hepatitis C blood test. Hepatitis B blood test. Sexually transmitted disease (STD) testing. Diabetes screening. This is done by checking your blood sugar (glucose) after you have not eaten for a while (fasting). You may have this done every 1-3 years. Abdominal aortic aneurysm (AAA) screening. You may need this if you are a current or former smoker. Osteoporosis. You may be screened starting at age 17 if you are at high risk. Talk with  your health care provider about your test results, treatment options, and if necessary, the need for more tests. Vaccines  Your health care provider may recommend  certain vaccines, such as: Influenza vaccine. This is recommended every year. Tetanus, diphtheria, and acellular pertussis (Tdap, Td) vaccine. You may need a Td booster every 10 years. Zoster vaccine. You may need this after age 5. Pneumococcal 13-valent conjugate (PCV13) vaccine. One dose is recommended after age 52. Pneumococcal polysaccharide (PPSV23) vaccine. One dose is recommended after age 61. Talk to your health care provider about which screenings and vaccines you need and how often you need them. This information is not intended to replace advice given to you by your health care provider. Make sure you discuss any questions you have with your health care provider. Document Released: 10/13/2015 Document Revised: 06/05/2016 Document Reviewed: 07/18/2015 Elsevier Interactive Patient Education  2017 [place] Prevention in the Home Falls can cause injuries. They can happen to people of all ages. There are many things you can do to make your home safe and to help prevent falls. What can I do on the outside of my home? Regularly fix the edges of walkways and driveways and fix any cracks. Remove anything that might make you trip as you walk through a door, such as a raised step or threshold. Trim any bushes or trees on the path to your home. Use bright outdoor lighting. Clear any walking paths of anything that might make someone trip, such as rocks or tools. Regularly check to see if handrails are loose or broken. Make sure that both sides of any steps have handrails. Any raised decks and porches should have guardrails on the edges. Have any leaves, snow, or ice cleared regularly. Use sand or salt on walking paths during winter. Clean up any spills in your garage right away. This includes oil or grease spills. What can I do in the bathroom? Use night lights. Install grab bars by the toilet and in the tub and shower. Do not use towel bars as grab bars. Use non-skid mats or  decals in the tub or shower. If you need to sit down in the shower, use a plastic, non-slip stool. Keep the floor dry. Clean up any water that spills on the floor as soon as it happens. Remove soap buildup in the tub or shower regularly. Attach bath mats securely with double-sided non-slip rug tape. Do not have throw rugs and other things on the floor that can make you trip. What can I do in the bedroom? Use night lights. Make sure that you have a light by your bed that is easy to reach. Do not use any sheets or blankets that are too big for your bed. They should not hang down onto the floor. Have a firm chair that has side arms. You can use this for support while you get dressed. Do not have throw rugs and other things on the floor that can make you trip. What can I do in the kitchen? Clean up any spills right away. Avoid walking on wet floors. Keep items that you use a lot in easy-to-reach places. If you need to reach something above you, use a strong step stool that has a grab bar. Keep electrical cords out of the way. Do not use floor polish or wax that makes floors slippery. If you must use wax, use non-skid floor wax. Do not have throw rugs and other things on the floor that can make you trip.  What can I do with my stairs? Do not leave any items on the stairs. Make sure that there are handrails on both sides of the stairs and use them. Fix handrails that are broken or loose. Make sure that handrails are as long as the stairways. Check any carpeting to make sure that it is firmly attached to the stairs. Fix any carpet that is loose or worn. Avoid having throw rugs at the top or bottom of the stairs. If you do have throw rugs, attach them to the floor with carpet tape. Make sure that you have a light switch at the top of the stairs and the bottom of the stairs. If you do not have them, ask someone to add them for you. What else can I do to help prevent falls? Wear shoes that: Do not  have high heels. Have rubber bottoms. Are comfortable and fit you well. Are closed at the toe. Do not wear sandals. If you use a stepladder: Make sure that it is fully opened. Do not climb a closed stepladder. Make sure that both sides of the stepladder are locked into place. Ask someone to hold it for you, if possible. Clearly mark and make sure that you can see: Any grab bars or handrails. First and last steps. Where the edge of each step is. Use tools that help you move around (mobility aids) if they are needed. These include: Canes. Walkers. Scooters. Crutches. Turn on the lights when you go into a dark area. Replace any light bulbs as soon as they burn out. Set up your furniture so you have a clear path. Avoid moving your furniture around. If any of your floors are uneven, fix them. If there are any pets around you, be aware of where they are. Review your medicines with your doctor. Some medicines can make you feel dizzy. This can increase your chance of falling. Ask your doctor what other things that you can do to help prevent falls. This information is not intended to replace advice given to you by your health care provider. Make sure you discuss any questions you have with your health care provider. Document Released: 07/13/2009 Document Revised: 02/22/2016 Document Reviewed: 10/21/2014 Elsevier Interactive Patient Education  2017 [employer].

## 2021-08-28 NOTE — Progress Notes (Signed)
Subjective:   [name] is a 70 y.o. male who presents for an Initial Medicare Annual Wellness Visit.  Virtual Visit via Telephone Note  I connected with  [name] on 08/28/21 at 11:15 AM EST by telephone and verified that I am speaking with the correct person using two identifiers.  Location: Patient: Home Provider: [facility] Persons participating in the virtual visit: patient/Nurse Health Advisor   I discussed the limitations, risks, security and privacy concerns of performing an evaluation and management service by telephone and the availability of in person appointments. The patient expressed understanding and agreed to proceed.  Interactive audio and video telecommunications were attempted between this nurse and patient, however failed, due to patient having technical difficulties OR patient did not have access to video capability.  We continued and completed visit with audio only.  Some vital signs may be absent or patient reported.   Mellie Buccellato E Miette Molenda, LPN   Review of Systems     Cardiac Risk Factors include: advanced age (>58men, >81 women);diabetes mellitus;dyslipidemia;hypertension;male gender     Objective:    Today's Vitals   08/28/21 1103  Weight: 219 lb (99.3 kg)  Height: $Remove'6\' 1"'dVXBWnl$  (1.854 m)   Body mass index is 28.89 kg/m.  Advanced Directives 08/28/2021 04/01/2021 04/18/2019 06/14/2018 06/14/2018 01/20/2018 05/29/2017  Does Patient Have a Medical Advance Directive? No No No No No No No  Type of Advance Directive - - - - - - -  Does patient want to make changes to medical advance directive? - - - - - - -  Copy of [place] in Chart? - - - - - - -  Would patient like information on creating a medical advance directive? No - Patient declined No - Patient declined - No - Patient declined No - Patient declined No - Patient declined -  Pre-existing out of facility DNR order (yellow form or pink MOST form) - - - - - - -    Current Medications  (verified) Outpatient Encounter Medications as of 08/28/2021  Medication Sig   amLODipine (NORVASC) 5 MG tablet TAKE 1 & 1/2 TABLETS ONCE A DAY IN THE MORNING   aspirin 81 MG tablet Take 81 mg by mouth daily.   blood glucose meter kit and supplies KIT Dispense based on patient and insurance preference. Use up to four times daily as directed. (FOR ICD-10 : E11.9   Cholecalciferol (VITAMIN D) 2000 UNITS CAPS Take 1 capsule by mouth daily.    glucose blood test strip Use as instructed   lisinopril (ZESTRIL) 20 MG tablet TAKE 1 TABLET DAILY   metFORMIN (GLUCOPHAGE) 500 MG tablet TAKE ONE TABLET ONCE DAILY WITH BREAKFAST   metoprolol succinate (TOPROL-XL) 100 MG 24 hr tablet Take with or immediately following a meal.   pravastatin (PRAVACHOL) 20 MG tablet Take 1 tablet (20 mg total) by mouth daily.   No facility-administered encounter medications on file as of 08/28/2021.    Allergies (verified) Penicillins   History: Past Medical History:  Diagnosis Date   Arthritis    Atypical small acinar proliferation of prostate    Benign paroxysmal positional vertigo    Diabetes mellitus without complication (HCC)    Elevated PSA    Essential hypertension, benign    GERD (gastroesophageal reflux disease)    History of basal cell carcinoma excision    few times   History of hiatal hernia    History of kidney stones    History of melanoma excision  x1  localized   History of small bowel obstruction    2009  &  2013--  both times resolved without surgical intervevtion   Hyperlipidemia    LAFB (left anterior fascicular block)    Prediabetes    Past Surgical History:  Procedure Laterality Date   GASTRIC FUNDOPLICATION  0940'H   LUMBAR DISC SURGERY  x3   last one 1980's   PROSTATE BIOPSY N/A 11/21/2015   Procedure: PROSTATE BIOPSY AND ULTRASOUND;  Surgeon: Irine Seal, MD;  Location: [facility];  Service: Urology;  Laterality: N/A;   Family History  Problem Relation Age of  Onset   Hypertension Mother    Melanoma Mother    Hypertension Father    Diabetes Father    Social History   Socioeconomic History   Marital status: Married    Spouse name: Caren Griffins   Number of children: Not on file   Years of education: Not on file   Highest education level: Not on file  Occupational History   Occupation: owner of furniture store    Employer: English as a second language teacher  Tobacco Use   Smoking status: Never   Smokeless tobacco: Never  Vaping Use   Vaping Use: Never used  Substance and Sexual Activity   Alcohol use: Yes    Comment: rare   Drug use: No   Sexual activity: Not on file  Other Topics Concern   Not on file  Social History Narrative   Owner at [employer] in [place]   Very independent, social, active   Social Determinants of Health   Financial Resource Strain: Low Risk    Difficulty of Paying Living Expenses: Not hard at all  Food Insecurity: No Food Insecurity   Worried About Charity fundraiser in the Last Year: Never true   Arboriculturist in the Last Year: Never true  Transportation Needs: No Transportation Needs   Lack of Transportation (Medical): No   Lack of Transportation (Non-Medical): No  Physical Activity: Sufficiently Active   Days of Exercise per Week: 5 days   Minutes of Exercise per Session: 60 min  Stress: No Stress Concern Present   Feeling of Stress : Not at all  Social Connections: Socially Integrated   Frequency of Communication with Friends and Family: More than three times a week   Frequency of Social Gatherings with Friends and Family: More than three times a week   Attends Religious Services: More than 4 times per year   Active Member of [employer] or Organizations: Yes   Attends Music therapist: More than 4 times per year   Marital Status: Married    Tobacco Counseling Counseling given: Not Answered   Clinical Intake:  Pre-visit preparation completed: Yes  Pain : No/denies pain     BMI - recorded:  28.99 Nutritional Status: BMI 25 -29 Overweight Nutritional Risks: None Diabetes: Yes CBG done?: No Did pt. bring in CBG monitor from home?: No  How often do you need to have someone help you when you read instructions, pamphlets, or other written materials from your doctor or pharmacy?: 1 - Never  Diabetic? Nutrition Risk Assessment:  Has the patient had any N/V/D within the last 2 months?  No  Does the patient have any non-healing wounds?  No  Has the patient had any unintentional weight loss or weight gain?  No   Diabetes:  Is the patient diabetic?  Yes  If diabetic, was a CBG obtained today?  No  Did the patient bring in their glucometer from home?  No  How often do you monitor your CBG's? Once daily fasting - 131 this am per patient.   Financial Strains and Diabetes Management:  Are you having any financial strains with the device, your supplies or your medication? No .  Does the patient want to be seen by Chronic Care Management for management of their diabetes?  No  Would the patient like to be referred to a Nutritionist or for Diabetic Management?  No   Diabetic Exams:  Diabetic Eye Exam: Completed 01/24/2021.   Diabetic Foot Exam: Completed 07/23/2021. Pt has been advised about the importance in completing this exam. Pt is scheduled for diabetic foot exam on next year.    Interpreter Needed?: No  Information entered by :: Amarah Brossman, LPN   Activities of Daily Living In your present state of health, do you have any difficulty performing the following activities: 08/28/2021  Hearing? N  Vision? N  Difficulty concentrating or making decisions? N  Walking or climbing stairs? N  Dressing or bathing? N  Doing errands, shopping? N  Preparing Food and eating ? N  Using the Toilet? N  In the past six months, have you accidently leaked urine? N  Do you have problems with loss of bowel control? N  Managing your Medications? N  Managing your Finances? N  Housekeeping  or managing your Housekeeping? N  Some recent data might be hidden    Patient Care Team: Claretta Fraise, MD as PCP - General (Family Medicine) Clarene Essex, MD as Consulting Physician (Gastroenterology) Irine Seal, MD as Attending Physician (Urology)  Indicate any recent Medical Services you may have received from other than [facility] providers in the past year (date may be approximate).     Assessment:   This is a routine wellness examination for Tiny.  Hearing/Vision screen Hearing Screening - Comments:: Denies hearing difficulties  Vision Screening - Comments:: Wears reading glasses only prn - up to date with annual eye exams with [facility]  Dietary issues and exercise activities discussed: Current Exercise Habits: The patient has a physically strenuous job, but has no regular exercise apart from work., Exercise limited by: None identified   Goals Addressed             This Visit's Progress    Exercise 150 min/wk Moderate Activity       Increase to 10,000 steps per day At 5000-6000 right now 08/28/2021       Depression Screen PHQ 2/9 Scores 08/28/2021 07/23/2021 03/16/2021 03/09/2021 10/24/2020 04/17/2020 10/27/2019  PHQ - 2 Score 0 0 0 0 0 0 0    Fall Risk Fall Risk  08/28/2021 07/23/2021 03/16/2021 03/09/2021 10/24/2020  Falls in the past year? 0 0 0 0 0  Number falls in past yr: 0 - - - 0  Injury with Fall? 0 - - - 0  Risk for fall due to : No Fall Risks - - - No Fall Risks  Follow up Falls prevention discussed - - Falls evaluation completed Falls evaluation completed    [place]:  Any stairs in or around the home? Yes  If so, are there any without handrails? No  Home free of loose throw rugs in walkways, pet beds, electrical cords, etc? Yes  Adequate lighting in your home to reduce risk of falls? Yes   ASSISTIVE DEVICES UTILIZED TO PREVENT FALLS:  Life alert? No  Use of a cane, walker  or w/c? No  Grab bars in the  bathroom? No  Shower chair or bench in shower? No  Elevated toilet seat or a handicapped toilet? No   TIMED UP AND GO:  Was the test performed? No . Telephonic visit  Cognitive Function:     6CIT Screen 08/28/2021  What Year? 0 points  What month? 0 points  What time? 0 points  Count back from 20 0 points  Months in reverse 0 points  Repeat phrase 0 points  Total Score 0    Immunizations Immunization History  Administered Date(s) Administered   Fluad Quad(high Dose 65+) 10/24/2020, 07/23/2021   Influenza Whole 05/31/2010   Influenza, High Dose Seasonal PF 09/17/2016, 08/01/2017, 07/28/2018   Influenza,inj,Quad PF,6+ Mos 07/22/2013, 07/07/2014, 09/18/2015   Influenza-Unspecified 07/06/2019   Pneumococcal Conjugate-13 07/07/2014   Pneumococcal Polysaccharide-23 10/10/2017   Tdap 06/30/2009, 03/09/2021   Zoster Recombinat (Shingrix) 07/06/2019, 09/08/2019   Zoster, Live 07/07/2012    TDAP status: Up to date  Flu Vaccine status: Up to date  Pneumococcal vaccine status: Up to date  Covid-19 vaccine status: Completed vaccines  Qualifies for Shingles Vaccine? Yes   Zostavax completed Yes   Shingrix Completed?: Yes  Screening Tests Health Maintenance  Topic Date Due   COVID-19 Vaccine (1) Never done   Hepatitis C Screening  Never done   HEMOGLOBIN A1C  01/21/2022   OPHTHALMOLOGY EXAM  01/24/2022   FOOT EXAM  07/23/2022   COLONOSCOPY (Pts 45-66yrs Insurance coverage will need to be confirmed)  04/15/2027   TETANUS/TDAP  03/10/2031   Pneumonia Vaccine 57+ Years old  Completed   INFLUENZA VACCINE  Completed   Zoster Vaccines- Shingrix  Completed   HPV VACCINES  Aged Out    Health Maintenance  Health Maintenance Due  Topic Date Due   COVID-19 Vaccine (1) Never done   Hepatitis C Screening  Never done    Colorectal cancer screening: Type of screening: Colonoscopy. Completed 04/14/2017. Repeat every 10 years  Lung Cancer Screening: (Low Dose CT Chest  recommended if Age 55-80 years, 30 pack-year currently smoking OR have quit w/in 15years.) does not qualify.   Additional Screening:  Hepatitis C Screening: does qualify; Due  Vision Screening: Recommended annual ophthalmology exams for early detection of glaucoma and other disorders of the eye. Is the patient up to date with their annual eye exam?  Yes  Who is the provider or what is the name of the office in which the patient attends annual eye exams? [place] If pt is not established with a provider, would they like to be referred to a provider to establish care? No .   Dental Screening: Recommended annual dental exams for proper oral hygiene  Community Resource Referral / Chronic Care Management: CRR required this visit?  No   CCM required this visit?  No      Plan:     I have personally reviewed and noted the following in the patient's chart:   Medical and social history Use of alcohol, tobacco or illicit drugs  Current medications and supplements including opioid prescriptions. Patient is not currently taking opioid prescriptions. Functional ability and status Nutritional status Physical activity Advanced directives List of other physicians Hospitalizations, surgeries, and ER visits in previous 12 months Vitals Screenings to include cognitive, depression, and falls Referrals and appointments  In addition, I have reviewed and discussed with patient certain preventive protocols, quality metrics, and best practice recommendations. A written personalized care plan for preventive services as well  as general preventive health recommendations were provided to patient.     Sandrea Hammond, LPN   24/79/9800   Nurse Notes: none

## 2021-09-20 ENCOUNTER — Other Ambulatory Visit: Payer: Self-pay | Admitting: Family Medicine

## 2021-10-09 DIAGNOSIS — Z85820 Personal history of malignant melanoma of skin: Secondary | ICD-10-CM | POA: Diagnosis not present

## 2021-10-09 DIAGNOSIS — Z08 Encounter for follow-up examination after completed treatment for malignant neoplasm: Secondary | ICD-10-CM | POA: Diagnosis not present

## 2021-10-09 DIAGNOSIS — X32XXXD Exposure to sunlight, subsequent encounter: Secondary | ICD-10-CM | POA: Diagnosis not present

## 2021-10-09 DIAGNOSIS — L570 Actinic keratosis: Secondary | ICD-10-CM | POA: Diagnosis not present

## 2021-10-10 ENCOUNTER — Other Ambulatory Visit: Payer: Self-pay | Admitting: *Deleted

## 2021-10-10 ENCOUNTER — Telehealth: Payer: Self-pay | Admitting: Family Medicine

## 2021-10-10 DIAGNOSIS — E291 Testicular hypofunction: Secondary | ICD-10-CM

## 2021-10-10 DIAGNOSIS — I10 Essential (primary) hypertension: Secondary | ICD-10-CM

## 2021-10-10 DIAGNOSIS — E559 Vitamin D deficiency, unspecified: Secondary | ICD-10-CM

## 2021-10-10 DIAGNOSIS — R972 Elevated prostate specific antigen [PSA]: Secondary | ICD-10-CM

## 2021-10-10 DIAGNOSIS — E782 Mixed hyperlipidemia: Secondary | ICD-10-CM

## 2021-10-10 DIAGNOSIS — E118 Type 2 diabetes mellitus with unspecified complications: Secondary | ICD-10-CM

## 2021-10-10 NOTE — Telephone Encounter (Signed)
Orders placed in order encounter

## 2021-10-23 ENCOUNTER — Ambulatory Visit: Payer: Medicare Other | Admitting: Family Medicine

## 2021-10-29 ENCOUNTER — Other Ambulatory Visit: Payer: Medicare Other

## 2021-10-29 DIAGNOSIS — I10 Essential (primary) hypertension: Secondary | ICD-10-CM

## 2021-10-29 DIAGNOSIS — E782 Mixed hyperlipidemia: Secondary | ICD-10-CM

## 2021-10-29 DIAGNOSIS — E291 Testicular hypofunction: Secondary | ICD-10-CM

## 2021-10-29 DIAGNOSIS — E118 Type 2 diabetes mellitus with unspecified complications: Secondary | ICD-10-CM | POA: Diagnosis not present

## 2021-10-29 DIAGNOSIS — E559 Vitamin D deficiency, unspecified: Secondary | ICD-10-CM

## 2021-10-29 DIAGNOSIS — R972 Elevated prostate specific antigen [PSA]: Secondary | ICD-10-CM

## 2021-10-29 LAB — BAYER DCA HB A1C WAIVED: HB A1C (BAYER DCA - WAIVED): 6.3 % — ABNORMAL HIGH (ref 4.8–5.6)

## 2021-10-30 LAB — CBC WITH DIFFERENTIAL/PLATELET
Basophils Absolute: 0.1 10*3/uL (ref 0.0–0.2)
Basos: 1 %
EOS (ABSOLUTE): 0.1 10*3/uL (ref 0.0–0.4)
Eos: 1 %
Hematocrit: 46.3 % (ref 37.5–51.0)
Hemoglobin: 15.6 g/dL (ref 13.0–17.7)
Immature Grans (Abs): 0 10*3/uL (ref 0.0–0.1)
Immature Granulocytes: 0 %
Lymphocytes Absolute: 1.6 10*3/uL (ref 0.7–3.1)
Lymphs: 29 %
MCH: 30.7 pg (ref 26.6–33.0)
MCHC: 33.7 g/dL (ref 31.5–35.7)
MCV: 91 fL (ref 79–97)
Monocytes Absolute: 0.4 10*3/uL (ref 0.1–0.9)
Monocytes: 6 %
Neutrophils Absolute: 3.6 10*3/uL (ref 1.4–7.0)
Neutrophils: 63 %
Platelets: 201 10*3/uL (ref 150–450)
RBC: 5.08 x10E6/uL (ref 4.14–5.80)
RDW: 13 % (ref 11.6–15.4)
WBC: 5.7 10*3/uL (ref 3.4–10.8)

## 2021-10-30 LAB — CMP14+EGFR
ALT: 15 IU/L (ref 0–44)
AST: 14 IU/L (ref 0–40)
Albumin/Globulin Ratio: 1.6 (ref 1.2–2.2)
Albumin: 4.6 g/dL (ref 3.7–4.7)
Alkaline Phosphatase: 134 IU/L — ABNORMAL HIGH (ref 44–121)
BUN/Creatinine Ratio: 22 (ref 10–24)
BUN: 20 mg/dL (ref 8–27)
Bilirubin Total: 0.6 mg/dL (ref 0.0–1.2)
CO2: 25 mmol/L (ref 20–29)
Calcium: 9.8 mg/dL (ref 8.6–10.2)
Chloride: 102 mmol/L (ref 96–106)
Creatinine, Ser: 0.91 mg/dL (ref 0.76–1.27)
Globulin, Total: 2.9 g/dL (ref 1.5–4.5)
Glucose: 141 mg/dL — ABNORMAL HIGH (ref 70–99)
Potassium: 4.4 mmol/L (ref 3.5–5.2)
Sodium: 141 mmol/L (ref 134–144)
Total Protein: 7.5 g/dL (ref 6.0–8.5)
eGFR: 90 mL/min/{1.73_m2} (ref >59)

## 2021-10-30 LAB — LIPID PANEL
Chol/HDL Ratio: 2.9 ratio (ref 0.0–5.0)
Cholesterol, Total: 128 mg/dL (ref 100–199)
HDL: 44 mg/dL (ref >39)
LDL Chol Calc (NIH): 65 mg/dL (ref 0–99)
Triglycerides: 103 mg/dL (ref 0–149)
VLDL Cholesterol Cal: 19 mg/dL (ref 5–40)

## 2021-10-30 LAB — PSA, TOTAL AND FREE
PSA, Free Pct: 17.5 %
PSA, Free: 0.96 ng/mL
Prostate Specific Ag, Serum: 5.5 ng/mL — ABNORMAL HIGH (ref 0.0–4.0)

## 2021-10-31 ENCOUNTER — Encounter: Payer: Self-pay | Admitting: Family Medicine

## 2021-10-31 ENCOUNTER — Ambulatory Visit (INDEPENDENT_AMBULATORY_CARE_PROVIDER_SITE_OTHER): Payer: Medicare Other | Admitting: Family Medicine

## 2021-10-31 DIAGNOSIS — E782 Mixed hyperlipidemia: Secondary | ICD-10-CM | POA: Diagnosis not present

## 2021-10-31 DIAGNOSIS — I10 Essential (primary) hypertension: Secondary | ICD-10-CM | POA: Diagnosis not present

## 2021-10-31 DIAGNOSIS — E118 Type 2 diabetes mellitus with unspecified complications: Secondary | ICD-10-CM | POA: Diagnosis not present

## 2021-10-31 MED ORDER — LISINOPRIL 20 MG PO TABS: 20.0000 mg | ORAL_TABLET | Freq: Every day | ORAL | 3 refills | Status: DC

## 2021-10-31 MED ORDER — PRAVASTATIN SODIUM 20 MG PO TABS: 20.0000 mg | ORAL_TABLET | Freq: Every day | ORAL | 3 refills | Status: DC

## 2021-10-31 NOTE — Progress Notes (Signed)
Subjective:  Patient ID: [name],  male    DOB: 29-Dec-1950  Age: 71 y.o.    CC: Medical Management of Chronic Issues   HPI [name] presents for  follow-up of hypertension. Patient has no history of headache chest pain or shortness of breath or recent cough. Patient also denies symptoms of TIA such as numbness weakness lateralizing. Patient denies side effects from medication. States taking it regularly.Home readings run 120s/70s  Patient also  in for follow-up of elevated cholesterol. Doing well without complaints on current medication. Denies side effects  including myalgia and arthralgia and nausea. Also in today for liver function testing. Currently no chest pain, shortness of breath or other cardiovascular related symptoms noted.  Follow-up of diabetes. Patient does check blood sugar at home. Readings run between 120 and 150 fasting and prandial. LOg reviewed, sent to scan. Patient denies symptoms such as excessive hunger or urinary frequency, excessive hunger, nausea No significant hypoglycemic spells noted. Medications reviewed. Pt reports taking them regularly. Pt. denies complication/adverse reaction today.    History [name] has a past medical history of Arthritis, Atypical small acinar proliferation of prostate, Benign paroxysmal positional vertigo, Diabetes mellitus without complication ([place]), Elevated PSA, Essential hypertension, benign, GERD (gastroesophageal reflux disease), History of basal cell carcinoma excision, History of hiatal hernia, History of kidney stones, History of melanoma excision, History of small bowel obstruction, Hyperlipidemia, LAFB (left anterior fascicular block), and Prediabetes.   [name] has a past surgical history that includes Lumbar disc surgery (x3   last one 1980's); Gastric fundoplication (0102'V); and Prostate biopsy (N/A, 11/21/2015).   His family history includes Diabetes in his father; Hypertension in his father and mother; Melanoma in his  mother.[name] reports that [name] has never smoked. [name] has never used smokeless tobacco. [name] reports current alcohol use. [name] reports that [name] does not use drugs.  Current Outpatient Medications on File Prior to Visit  Medication Sig Dispense Refill   ACCU-CHEK GUIDE test strip CHECK BLOOD SUGAR UP TO 4 TIMES A DAY 100 strip 0   amLODipine (NORVASC) 5 MG tablet TAKE 1 & 1/2 TABLETS ONCE A DAY IN THE MORNING 135 tablet 2   aspirin 81 MG tablet Take 81 mg by mouth daily.     blood glucose meter kit and supplies KIT Dispense based on patient and insurance preference. Use up to four times daily as directed. (FOR ICD-10 : E11.9 1 each 0   Cholecalciferol (VITAMIN D) 2000 UNITS CAPS Take 1 capsule by mouth daily.      metFORMIN (GLUCOPHAGE) 500 MG tablet TAKE ONE TABLET ONCE DAILY WITH BREAKFAST 90 tablet 2   metoprolol succinate (TOPROL-XL) 100 MG 24 hr tablet Take with or immediately following a meal. 90 tablet 2   No current facility-administered medications on file prior to visit.    ROS Review of Systems  Constitutional:  Negative for fever.  Respiratory:  Negative for shortness of breath.   Cardiovascular:  Negative for chest pain.  Musculoskeletal:  Negative for arthralgias.  Skin:  Negative for rash.   Objective:  BP (!) 153/68    Pulse 67    Temp (!) 96.9 F (36.1 C)    Ht _0  (1.854 m)    Wt 228 lb 9.6 oz (103.7 kg)    SpO2 97%    BMI 30.16 kg/m   BP Readings from Last 3 Encounters:  10/31/21 (!) 153/68  07/23/21 (!) 143/73  05/24/21 140/80    Wt Readings from Last 3  Encounters:  10/31/21 228 lb 9.6 oz (103.7 kg)  08/28/21 219 lb (99.3 kg)  07/23/21 231 lb 3.2 oz (104.9 kg)     Physical Exam Vitals reviewed.  Constitutional:      Appearance: [name] is well-developed.  HENT:     Head: Normocephalic and atraumatic.     Right Ear: External ear normal.     Left Ear: External ear normal.     Mouth/Throat:     Pharynx: No oropharyngeal exudate or posterior oropharyngeal erythema.   Eyes:     Pupils: Pupils are equal, round, and reactive to light.  Cardiovascular:     Rate and Rhythm: Normal rate and regular rhythm.     Heart sounds: No murmur heard. Pulmonary:     Effort: No respiratory distress.     Breath sounds: Normal breath sounds.  Musculoskeletal:     Cervical back: Normal range of motion and neck supple.  Neurological:     Mental Status: [name] is alert and oriented to person, place, and time.    Diabetic Foot Exam - Simple   No data filed       Assessment & Plan:   [name] was seen today for medical management of chronic issues.  Diagnoses and all orders for this visit:  Controlled type 2 diabetes mellitus with complication, without long-term current use of insulin (HCC) -     lisinopril (ZESTRIL) 20 MG tablet; Take 1 tablet (20 mg total) by mouth daily. -     pravastatin (PRAVACHOL) 20 MG tablet; Take 1 tablet (20 mg total) by mouth daily.  Hypertension -     lisinopril (ZESTRIL) 20 MG tablet; Take 1 tablet (20 mg total) by mouth daily.  Mixed hyperlipidemia -     pravastatin (PRAVACHOL) 20 MG tablet; Take 1 tablet (20 mg total) by mouth daily.   I have changed [name]'s lisinopril. I am also having him maintain his Vitamin D, aspirin, blood glucose meter kit and supplies, amLODipine, metFORMIN, metoprolol succinate, Accu-Chek Guide, and pravastatin.  Meds ordered this encounter  Medications   lisinopril (ZESTRIL) 20 MG tablet    Sig: Take 1 tablet (20 mg total) by mouth daily.    Dispense:  90 tablet    Refill:  3   pravastatin (PRAVACHOL) 20 MG tablet    Sig: Take 1 tablet (20 mg total) by mouth daily.    Dispense:  90 tablet    Refill:  3     Follow-up: Return in about 6 months (around 04/30/2022).  Claretta Fraise, M.D.

## 2022-02-28 LAB — HM DIABETES EYE EXAM

## 2022-03-13 DIAGNOSIS — D485 Neoplasm of uncertain behavior of skin: Secondary | ICD-10-CM | POA: Diagnosis not present

## 2022-03-13 DIAGNOSIS — Z85820 Personal history of malignant melanoma of skin: Secondary | ICD-10-CM | POA: Diagnosis not present

## 2022-03-13 DIAGNOSIS — D225 Melanocytic nevi of trunk: Secondary | ICD-10-CM | POA: Diagnosis not present

## 2022-03-13 DIAGNOSIS — Z08 Encounter for follow-up examination after completed treatment for malignant neoplasm: Secondary | ICD-10-CM | POA: Diagnosis not present

## 2022-03-13 DIAGNOSIS — Z1283 Encounter for screening for malignant neoplasm of skin: Secondary | ICD-10-CM | POA: Diagnosis not present

## 2022-04-05 DIAGNOSIS — L988 Other specified disorders of the skin and subcutaneous tissue: Secondary | ICD-10-CM | POA: Diagnosis not present

## 2022-04-05 DIAGNOSIS — D485 Neoplasm of uncertain behavior of skin: Secondary | ICD-10-CM | POA: Diagnosis not present

## 2022-05-04 ENCOUNTER — Other Ambulatory Visit: Payer: Self-pay | Admitting: Family Medicine

## 2022-05-24 ENCOUNTER — Encounter (HOSPITAL_COMMUNITY): Payer: Self-pay

## 2022-05-24 ENCOUNTER — Encounter (HOSPITAL_BASED_OUTPATIENT_CLINIC_OR_DEPARTMENT_OTHER): Payer: Self-pay

## 2022-05-24 ENCOUNTER — Emergency Department (HOSPITAL_BASED_OUTPATIENT_CLINIC_OR_DEPARTMENT_OTHER): Payer: Medicare Other

## 2022-05-24 ENCOUNTER — Emergency Department (HOSPITAL_COMMUNITY)
Admission: EM | Admit: 2022-05-24 | Discharge: 2022-05-24 | Discharge status: Against Medical Advice | Payer: Medicare Other | Source: Home / Self Care

## 2022-05-24 ENCOUNTER — Other Ambulatory Visit: Payer: Self-pay

## 2022-05-24 ENCOUNTER — Emergency Department (HOSPITAL_BASED_OUTPATIENT_CLINIC_OR_DEPARTMENT_OTHER)
Admission: EM | Admit: 2022-05-24 | Discharge: 2022-05-24 | Disposition: A | Discharge status: Home / Self Care | Payer: Medicare Other | Attending: Emergency Medicine | Admitting: Emergency Medicine

## 2022-05-24 DIAGNOSIS — R1084 Generalized abdominal pain: Secondary | ICD-10-CM | POA: Insufficient documentation

## 2022-05-24 DIAGNOSIS — Z5321 Procedure and treatment not carried out due to patient leaving prior to being seen by health care provider: Secondary | ICD-10-CM | POA: Insufficient documentation

## 2022-05-24 DIAGNOSIS — K567 Ileus, unspecified: Secondary | ICD-10-CM | POA: Diagnosis not present

## 2022-05-24 DIAGNOSIS — I700 Atherosclerosis of aorta: Secondary | ICD-10-CM | POA: Diagnosis not present

## 2022-05-24 DIAGNOSIS — I10 Essential (primary) hypertension: Secondary | ICD-10-CM | POA: Diagnosis not present

## 2022-05-24 DIAGNOSIS — E119 Type 2 diabetes mellitus without complications: Secondary | ICD-10-CM | POA: Diagnosis not present

## 2022-05-24 DIAGNOSIS — R109 Unspecified abdominal pain: Secondary | ICD-10-CM | POA: Diagnosis not present

## 2022-05-24 LAB — COMPREHENSIVE METABOLIC PANEL
ALT: 17 U/L (ref 0–44)
AST: 14 U/L — ABNORMAL LOW (ref 15–41)
Albumin: 4.2 g/dL (ref 3.5–5.0)
Alkaline Phosphatase: 107 U/L (ref 38–126)
Anion gap: 8 (ref 5–15)
BUN: 19 mg/dL (ref 8–23)
CO2: 28 mmol/L (ref 22–32)
Calcium: 9.8 mg/dL (ref 8.9–10.3)
Chloride: 100 mmol/L (ref 98–111)
Creatinine, Ser: 0.96 mg/dL (ref 0.61–1.24)
GFR, Estimated: >60 mL/min (ref >60)
Glucose, Bld: 188 mg/dL — ABNORMAL HIGH (ref 70–99)
Potassium: 4 mmol/L (ref 3.5–5.1)
Sodium: 136 mmol/L (ref 135–145)
Total Bilirubin: 0.7 mg/dL (ref 0.3–1.2)
Total Protein: 7.4 g/dL (ref 6.5–8.1)

## 2022-05-24 LAB — CBC WITH DIFFERENTIAL/PLATELET
Abs Immature Granulocytes: 0.04 10*3/uL (ref 0.00–0.07)
Basophils Absolute: 0 10*3/uL (ref 0.0–0.1)
Basophils Relative: 0 %
Eosinophils Absolute: 0.1 10*3/uL (ref 0.0–0.5)
Eosinophils Relative: 1 %
HCT: 43.7 % (ref 39.0–52.0)
Hemoglobin: 15.1 g/dL (ref 13.0–17.0)
Immature Granulocytes: 0 %
Lymphocytes Relative: 12 %
Lymphs Abs: 1.1 10*3/uL (ref 0.7–4.0)
MCH: 30.5 pg (ref 26.0–34.0)
MCHC: 34.6 g/dL (ref 30.0–36.0)
MCV: 88.3 fL (ref 80.0–100.0)
Monocytes Absolute: 0.5 10*3/uL (ref 0.1–1.0)
Monocytes Relative: 5 %
Neutro Abs: 7.4 10*3/uL (ref 1.7–7.7)
Neutrophils Relative %: 82 %
Platelets: 171 10*3/uL (ref 150–400)
RBC: 4.95 MIL/uL (ref 4.22–5.81)
RDW: 13.2 % (ref 11.5–15.5)
WBC: 9.1 10*3/uL (ref 4.0–10.5)
nRBC: 0 % (ref 0.0–0.2)

## 2022-05-24 LAB — LIPASE, BLOOD: Lipase: <10 U/L — ABNORMAL LOW (ref 11–51)

## 2022-05-24 MED ORDER — ONDANSETRON 4 MG PO TBDP: 4.0000 mg | ORAL_TABLET | Freq: Three times a day (TID) | ORAL | 0 refills | Status: DC | PRN

## 2022-05-24 MED ORDER — ONDANSETRON HCL 4 MG/2ML IJ SOLN
4.0000 mg | Freq: Once | INTRAMUSCULAR | Status: AC
  Administered 2022-05-24: 4 mg via INTRAVENOUS
  Filled 2022-05-24: qty 2

## 2022-05-24 MED ORDER — IOHEXOL 300 MG/ML  SOLN
100.0000 mL | Freq: Once | INTRAMUSCULAR | Status: AC | PRN
  Administered 2022-05-24: 100 mL via INTRAVENOUS

## 2022-05-24 MED ORDER — SODIUM CHLORIDE 0.9 % IV BOLUS
500.0000 mL | Freq: Once | INTRAVENOUS | Status: AC
  Administered 2022-05-24: 500 mL via INTRAVENOUS

## 2022-05-24 MED ORDER — FENTANYL CITRATE PF 50 MCG/ML IJ SOSY
50.0000 ug | PREFILLED_SYRINGE | Freq: Once | INTRAMUSCULAR | Status: AC
  Administered 2022-05-24: 50 ug via INTRAVENOUS
  Filled 2022-05-24: qty 1

## 2022-05-24 NOTE — ED Notes (Signed)
Pt no longer wanted to wait  

## 2022-05-24 NOTE — ED Provider Triage Note (Cosign Needed Addendum)
Emergency Medicine Provider Triage Evaluation Note  [name] , a 71 y.o. male  was evaluated in triage.  Pt complains of generalized abdominal pain onset today. Pt was evaluated at [facility] last night for similar symptoms with a complete workup. Shared decision making was made and patient noted that he would like to go home and not be admitted. Pt came back in due to worsening abdominal pain. Denies constipation. History of hiatal hernia repair.  Review of Systems  Positive: As per HPI Negative:   Physical Exam  BP (!) 166/94 (BP Location: Right Arm)   Pulse 69   Temp 98.5 F (36.9 C) (Oral)   Resp 16   Ht 6' (1.829 m)   Wt 99.8 kg   SpO2 95%   BMI 29.84 kg/m  Gen:   Awake, no distress   Resp:  Normal effort  MSK:   Moves extremities without difficulty  Other:  Generalized abdominal TTP.  Medical Decision Making  Medically screening exam initiated at 3:06 PM.  Appropriate orders placed.  Scotti Kosta was informed that the remainder of the evaluation will be completed by another provider, this initial triage assessment does not replace that evaluation, and the importance of remaining in the ED until their evaluation is complete.  Work-up initiated.   Ashlinn Hemrick A, PA-C 05/24/22 1511   9:10 PM - discussed with RN that patient would need a room due to worsening abdominal pain and findings of obstruction on CT AP this morning at [facility]. RN aware.   Jan Olano A, PA-C 05/24/22 2111

## 2022-05-24 NOTE — ED Provider Notes (Signed)
Emergency Department Provider Note   I have reviewed the triage vital signs and the nursing notes.   HISTORY  Chief Complaint Abdominal Pain   HPI [name] is a 71 y.o. male with past history of diabetes, GERD, hiatal hernia surgery and resulting small bowel obstructions (2009/2013/2019) presents to the emergency department with concern for obstruction symptoms.  He developed return of familiar abdominal pain with distention and lack of bowel movement.  He is felt very nauseated.  He is unable to vomit due to his hiatal hernia surgery.  He states he did have a bowel movement earlier this evening but that was prior to his symptoms beginning.  He has passed a very small amount of gas but is belching frequently.  No discomfort into the chest or shortness of breath.  No fevers or chills.  No dysuria, hesitancy, urgency.   Past Medical History:  Diagnosis Date   Arthritis    Atypical small acinar proliferation of prostate    Benign paroxysmal positional vertigo    Diabetes mellitus without complication (HCC)    Elevated PSA    Essential hypertension, benign    GERD (gastroesophageal reflux disease)    History of basal cell carcinoma excision    few times   History of hiatal hernia    History of kidney stones    History of melanoma excision    x1  localized   History of small bowel obstruction    2009  &  2013--  both times resolved without surgical intervevtion   Hyperlipidemia    LAFB (left anterior fascicular block)    Prediabetes     Review of Systems  Constitutional: No fever/chills Eyes: No visual changes. ENT: No sore throat. Cardiovascular: Denies chest pain. Respiratory: Denies shortness of breath. Gastrointestinal: Positive abdominal pain. Positive nausea, no vomiting.  No diarrhea. Positive constipation. Genitourinary: Negative for dysuria. Musculoskeletal: Negative for back pain. Skin: Negative for rash. Neurological: Negative for  headaches.  ____________________________________________   PHYSICAL EXAM:  VITAL SIGNS: ED Triage Vitals  Enc Vitals Group     BP 05/24/22 0429 (!) 155/77     Pulse Rate 05/24/22 0429 74     Resp 05/24/22 0429 18     Temp 05/24/22 0429 98.6 F (37 C)     Temp Source 05/24/22 0429 Oral     SpO2 05/24/22 0429 94 %     Weight 05/24/22 0429 220 lb (99.8 kg)     Height 05/24/22 0429 6' (1.829 m)   Constitutional: Alert and oriented. Well appearing and in no acute distress. Eyes: Conjunctivae are normal.  Head: Atraumatic. Nose: No congestion/rhinnorhea. Mouth/Throat: Mucous membranes are moist.   Neck: No stridor.  Cardiovascular: Normal rate, regular rhythm. Good peripheral circulation. Grossly normal heart sounds.   Respiratory: Normal respiratory effort.  No retractions. Lungs CTAB. Gastrointestinal: Soft with mild diffuse tenderness. Positive distention with occasional higher pitched bowel sounds.  Musculoskeletal: No lower extremity tenderness nor edema. No gross deformities of extremities. Neurologic:  Normal speech and language. No gross focal neurologic deficits are appreciated.  Skin:  Skin is warm, dry and intact. No rash noted.   ____________________________________________   LABS (all labs ordered are listed, but only abnormal results are displayed)  Labs Reviewed  COMPREHENSIVE METABOLIC PANEL - Abnormal; Notable for the following components:      Result Value   Glucose, Bld 188 (*)    AST 14 (*)    All other components within normal limits  LIPASE, BLOOD -  Abnormal; Notable for the following components:   Lipase <10 (*)    All other components within normal limits  CBC WITH DIFFERENTIAL/PLATELET   ____________________________________________  RADIOLOGY  CT ABDOMEN PELVIS W CONTRAST  Result Date: 05/24/2022 CLINICAL DATA:  71 year old male with abdominal pain since 2000 hours. EXAM: CT ABDOMEN AND PELVIS WITH CONTRAST TECHNIQUE: Multidetector CT imaging  of the abdomen and pelvis was performed using the standard protocol following bolus administration of intravenous contrast. RADIATION DOSE REDUCTION: This exam was performed according to the departmental dose-optimization program which includes automated exposure control, adjustment of the mA and/or kV according to patient size and/or use of iterative reconstruction technique. CONTRAST:  156m OMNIPAQUE IOHEXOL 300 MG/ML  SOLN COMPARISON:  CT Abdomen and Pelvis 04/19/2019 and earlier. FINDINGS: Lower chest: Stable mild lung base scarring and/or atelectasis. No pericardial or pleural effusion. Cardiac size remains within normal limits. Hepatobiliary: Trace free fluid along the posterior right hepatic lobe and situated between the liver and diaphragm (series 2, image 18) with simple fluid density. Liver and gallbladder otherwise are negative. Pancreas: Negative. Spleen: Negative. Adrenals/Urinary Tract: Normal adrenal glands. Symmetric renal enhancement and contrast excretion with no hydronephrosis or hydroureter. However, there are multiple dependent stones within the urinary bladder, ranging from punctate (series 2, image 75 on the right) up to 13 mm (image 74). Similar appearance in 2020. Stomach/Bowel: Mild retained stool throughout the large bowel. Normal appendix on series 2, image 55. Diverticulosis at the junction of the descending and sigmoid colon without active inflammation. Decompressed terminal ileum series 2, image 50. But multiple fluid distended small bowel loops in the right lower quadrant (series 2, image 63) with trace edema or free fluid in the adjacent small bowel mesentery. However, there is only a gradual transition from these loops to the TI. And other less dilated air and fluid containing small bowel loops throughout the upper abdomen. Similar air in fluid distension of the stomach. The duodenum and proximal jejunum are decompressed. No free air. Vascular/Lymphatic: Aortoiliac calcified  atherosclerosis. Major vascular structures in the abdomen and pelvis appear patent. No lymphadenopathy identified. Reproductive: Chronic prostatomegaly. Bulky chronic vas deferens and other dystrophic calcifications in the pelvis. Other: No pelvic free fluid. Musculoskeletal: Advanced chronic degeneration at the lumbosacral junction. Benign right medial iliac bone island has been present since at least 2015 and is stable. No acute osseous abnormality identified. IMPRESSION: 1. Multiple air-and-fluid distended small bowel loops in the abdomen, especially the right lower quadrant where mild mesenteric edema or trace free fluid in the mesentery is noted. However, only a gradual transition from abnormal loops to decompressed terminal ileum and proximal jejunum. No free air. Differential considerations include partial small bowel obstruction, small-bowel ileus, acute enteritis. No large bowel inflammation, and the appendix appears to remain normal. 2. Small volume perihepatic free fluid, probably reactive due to #1. 3. Multiple urinary bladder stones, similar to 2020. No acute obstructive uropathy. 4. Aortic Atherosclerosis (ICD10-I70.0). Electronically Signed   By: HGenevie AnnM.D.   On: 05/24/2022 06:16    ____________________________________________   PROCEDURES  Procedure(s) performed:   Procedures  None  ____________________________________________   INITIAL IMPRESSION / ASSESSMENT AND PLAN / ED COURSE  Pertinent labs & imaging results that were available during my care of the patient were reviewed by me and considered in my medical decision making (see chart for details).   This patient is Presenting for Evaluation of abdominal pain, which does require a range of treatment options, and is a complaint that  involves a high risk of morbidity and mortality.  The Differential Diagnoses includes but is not exclusive to acute cholecystitis, intrathoracic causes for epigastric abdominal pain, gastritis,  duodenitis, pancreatitis, small bowel or large bowel obstruction, abdominal aortic aneurysm, hernia, gastritis, etc.   Critical Interventions-    Medications  ondansetron (ZOFRAN) injection 4 mg (4 mg Intravenous Given 05/24/22 0455)  sodium chloride 0.9 % bolus 500 mL (500 mLs Intravenous New Bag/Given 05/24/22 0458)  fentaNYL (SUBLIMAZE) injection 50 mcg (50 mcg Intravenous Given 05/24/22 0455)  iohexol (OMNIPAQUE) 300 MG/ML solution 100 mL (100 mLs Intravenous Contrast Given 05/24/22 0547)    Reassessment after intervention:  Patient feeling well without significant nausea or pain currently.    I did obtain Additional Historical Information from wife at bedside.  I decided to review pertinent External Data, and in summary prior SBO with last brief admit with 2019; SBO vs ileus. Did not require surgery.   Clinical Laboratory Tests Ordered, included CBC without leukocytosis or anemia.  No acute kidney injury.  LFTs and bilirubin are normal.  Lipase normal.  Radiologic Tests Ordered, included CT abdomen/pelvis. I independently interpreted the images and agree with radiology interpretation.    Social Determinants of Health Risk patient is a non-smoker.   Medical Decision Making: Summary:  Patient presents emergency department with abdominal pain and distention and concern for small bowel obstruction versus ileus.  Exam is somewhat consistent with this.  Overall he is well-appearing with normal vital signs.  Plan for labs along with CT abdomen pelvis with fluids and pain/nausea management.  Reevaluation with update and discussion with patient and wife at bedside.  Patient currently is pain-free and nausea significantly improved.  Vital signs are stable.  We discussed his CT findings and differential as outlined by radiology.  Clinically, seems most consistent with an ileus rather than partial small bowel obstruction.  Either way, patient is not having active pain and has passed some flatus.   He has been through this multiple times and understands the potential for worsening.  We had a shared decision-making discussion regarding admission, which was considered, but the patient would prefer, given that he is currently symptom-free, to return home with bowel rest/liquid diet for 1 to 2 days and then advance as tolerated.  He understands that choosing this option, he will need to return with any new or suddenly worsening pain.  The patient CT scan does show some likely reactive perihepatic fluid.  Agree with radiology that is likely reactive.  He does not have any peritoneal findings on repeat abdominal exam.   Disposition: discharge  ____________________________________________  FINAL CLINICAL IMPRESSION(S) / ED DIAGNOSES  Final diagnoses:  Ileus ([place])     NEW OUTPATIENT MEDICATIONS STARTED DURING THIS VISIT:  New Prescriptions   ONDANSETRON (ZOFRAN-ODT) 4 MG DISINTEGRATING TABLET    Take 1 tablet (4 mg total) by mouth every 8 (eight) hours as needed.    Note:  This document was prepared using Dragon voice recognition software and may include unintentional dictation errors.  Nanda Quinton, MD, [facility] Emergency Medicine    Kalep Full, [facility], MD 05/24/22 [phone]

## 2022-05-24 NOTE — ED Triage Notes (Signed)
Pt arrived POV from home c/o abdominal pain that started yesterday. Pt states he was seen at [facility] yesterday with a full work up and told he had a partial blockage and that he could go home or be admitted. Pt elected to go home and just drink fluids but then the pain came back after lunch so he decided to come in.

## 2022-05-24 NOTE — ED Triage Notes (Signed)
Pt. Aaxo4, ambulatory, cc abd pain, denies NVD, states he has had hx of SBO but this feels different and hurts worse and lower down. States he took one of his wife's oxycodones but it did not help, states last BM was yesterday. Able to pass gas.

## 2022-05-24 NOTE — Discharge Instructions (Signed)
You are seen in the emergency room today with abdominal pain.  Your CT scan shows some slow transit through your intestines which may be due to a partial obstruction or ileus.  We discussed management options and decided to send you home.  I have called in a prescription to your pharmacy for Zofran which will help with nausea.  For the next 24 to 48 hours please focus mainly on staying hydrated with largely liquid diet.  If you are feeling well tomorrow you can advance your diet to mushy, bland foods and then slowly back to normal.  If you develop sudden worsening pain, nausea, inability to pass gas or have a bowel movement you should return to the emergency department as this could develop into a full bowel obstruction which would require more aggressive and likely inpatient management.

## 2022-06-04 ENCOUNTER — Other Ambulatory Visit: Payer: Self-pay | Admitting: Family Medicine

## 2022-06-17 ENCOUNTER — Other Ambulatory Visit: Payer: Self-pay | Admitting: *Deleted

## 2022-06-17 DIAGNOSIS — E118 Type 2 diabetes mellitus with unspecified complications: Secondary | ICD-10-CM

## 2022-06-17 DIAGNOSIS — I10 Essential (primary) hypertension: Secondary | ICD-10-CM

## 2022-06-17 DIAGNOSIS — E782 Mixed hyperlipidemia: Secondary | ICD-10-CM

## 2022-06-18 ENCOUNTER — Encounter: Payer: Self-pay | Admitting: Family Medicine

## 2022-06-18 ENCOUNTER — Ambulatory Visit (INDEPENDENT_AMBULATORY_CARE_PROVIDER_SITE_OTHER): Payer: Medicare Other | Admitting: Family Medicine

## 2022-06-18 VITALS — BP 132/67 | HR 63 | Temp 98.2°F | Ht 72.0 in | Wt 222.0 lb

## 2022-06-18 DIAGNOSIS — E118 Type 2 diabetes mellitus with unspecified complications: Secondary | ICD-10-CM | POA: Diagnosis not present

## 2022-06-18 DIAGNOSIS — Z23 Encounter for immunization: Secondary | ICD-10-CM

## 2022-06-18 DIAGNOSIS — I10 Essential (primary) hypertension: Secondary | ICD-10-CM

## 2022-06-18 DIAGNOSIS — E782 Mixed hyperlipidemia: Secondary | ICD-10-CM | POA: Diagnosis not present

## 2022-06-18 LAB — CBC WITH DIFFERENTIAL/PLATELET
Basophils Absolute: 0.1 10*3/uL (ref 0.0–0.2)
Basos: 1 %
EOS (ABSOLUTE): 0.1 10*3/uL (ref 0.0–0.4)
Eos: 2 %
Hematocrit: 43.8 % (ref 37.5–51.0)
Hemoglobin: 14.9 g/dL (ref 13.0–17.7)
Immature Grans (Abs): 0 10*3/uL (ref 0.0–0.1)
Immature Granulocytes: 0 %
Lymphocytes Absolute: 1.8 10*3/uL (ref 0.7–3.1)
Lymphs: 32 %
MCH: 30.4 pg (ref 26.6–33.0)
MCHC: 34 g/dL (ref 31.5–35.7)
MCV: 89 fL (ref 79–97)
Monocytes Absolute: 0.4 10*3/uL (ref 0.1–0.9)
Monocytes: 8 %
Neutrophils Absolute: 3.1 10*3/uL (ref 1.4–7.0)
Neutrophils: 57 %
Platelets: 177 10*3/uL (ref 150–450)
RBC: 4.9 x10E6/uL (ref 4.14–5.80)
RDW: 13.1 % (ref 11.6–15.4)
WBC: 5.5 10*3/uL (ref 3.4–10.8)

## 2022-06-18 LAB — CMP14+EGFR
ALT: 15 IU/L (ref 0–44)
AST: 13 IU/L (ref 0–40)
Albumin/Globulin Ratio: 1.4 (ref 1.2–2.2)
Albumin: 4.1 g/dL (ref 3.8–4.8)
Alkaline Phosphatase: 118 IU/L (ref 44–121)
BUN/Creatinine Ratio: 23 (ref 10–24)
BUN: 22 mg/dL (ref 8–27)
Bilirubin Total: 0.6 mg/dL (ref 0.0–1.2)
CO2: 23 mmol/L (ref 20–29)
Calcium: 9.1 mg/dL (ref 8.6–10.2)
Chloride: 103 mmol/L (ref 96–106)
Creatinine, Ser: 0.95 mg/dL (ref 0.76–1.27)
Globulin, Total: 3 g/dL (ref 1.5–4.5)
Glucose: 132 mg/dL — ABNORMAL HIGH (ref 70–99)
Potassium: 3.8 mmol/L (ref 3.5–5.2)
Sodium: 142 mmol/L (ref 134–144)
Total Protein: 7.1 g/dL (ref 6.0–8.5)
eGFR: 85 mL/min/{1.73_m2} (ref >59)

## 2022-06-18 LAB — LIPID PANEL
Chol/HDL Ratio: 2.7 ratio (ref 0.0–5.0)
Cholesterol, Total: 115 mg/dL (ref 100–199)
HDL: 43 mg/dL (ref >39)
LDL Chol Calc (NIH): 53 mg/dL (ref 0–99)
Triglycerides: 102 mg/dL (ref 0–149)
VLDL Cholesterol Cal: 19 mg/dL (ref 5–40)

## 2022-06-18 LAB — BAYER DCA HB A1C WAIVED: HB A1C (BAYER DCA - WAIVED): 6.5 % — ABNORMAL HIGH (ref 4.8–5.6)

## 2022-06-18 MED ORDER — AMLODIPINE BESYLATE 5 MG PO TABS: ORAL_TABLET | ORAL | 3 refills | Status: DC

## 2022-06-18 MED ORDER — METFORMIN HCL 500 MG PO TABS: ORAL_TABLET | ORAL | 3 refills | Status: DC

## 2022-06-18 MED ORDER — METOPROLOL SUCCINATE ER 100 MG PO TB24
ORAL_TABLET | ORAL | 2 refills | Status: DC
Start: 2022-06-18 — End: 2023-04-21

## 2022-06-18 NOTE — Progress Notes (Signed)
Subjective:  Patient ID: [name],  male    DOB: [birth year]  Age: 71 y.o.    CC: Medical Management of Chronic Issues   HPI Rodolfo Gaster presents for  follow-up of hypertension. Patient has no history of headache chest pain or shortness of breath or recent cough. Patient also denies symptoms of TIA such as numbness weakness lateralizing. Patient denies side effects from medication. States taking it regularly.  Patient also  in for follow-up of elevated cholesterol. Doing well without complaints on current medication. Denies side effects  including myalgia and arthralgia and nausea. Also in today for liver function testing. Currently no chest pain, shortness of breath or other cardiovascular related symptoms noted.  Follow-up of diabetes. Patient does check blood sugar at home. Patient denies symptoms such as excessive hunger or urinary frequency, excessive hunger, nausea No significant hypoglycemic spells noted. Medications reviewed. Pt reports taking them regularly. Pt. denies complication/adverse reaction today.    History Ronne has a past medical history of Arthritis, Atypical small acinar proliferation of prostate, Benign paroxysmal positional vertigo, Diabetes mellitus without complication ([place]), Elevated PSA, Essential hypertension, benign, GERD (gastroesophageal reflux disease), History of basal cell carcinoma excision, History of hiatal hernia, History of kidney stones, History of melanoma excision, History of small bowel obstruction, Hyperlipidemia, LAFB (left anterior fascicular block), and Prediabetes.   He has a past surgical history that includes Lumbar disc surgery (x3   last one 1980's); Gastric fundoplication (2637'C); and Prostate biopsy (N/A, 11/21/2015).   His family history includes Diabetes in his father; Hypertension in his father and mother; Melanoma in his mother.He reports that he has never smoked. He has never used smokeless tobacco. He reports current alcohol  use. He reports that he does not use drugs.  Current Outpatient Medications on File Prior to Visit  Medication Sig Dispense Refill   ACCU-CHEK GUIDE test strip CHECK BLOOD SUGAR UP TO 4 TIMES A DAY 100 strip 0   aspirin 81 MG tablet Take 81 mg by mouth daily.     blood glucose meter kit and supplies KIT Dispense based on patient and insurance preference. Use up to four times daily as directed. (FOR ICD-10 : E11.9 1 each 0   Cholecalciferol (VITAMIN D) 2000 UNITS CAPS Take 1 capsule by mouth daily.      lisinopril (ZESTRIL) 20 MG tablet Take 1 tablet (20 mg total) by mouth daily. 90 tablet 3   ondansetron (ZOFRAN-ODT) 4 MG disintegrating tablet Take 1 tablet (4 mg total) by mouth every 8 (eight) hours as needed. 20 tablet 0   pravastatin (PRAVACHOL) 20 MG tablet Take 1 tablet (20 mg total) by mouth daily. 90 tablet 3   No current facility-administered medications on file prior to visit.    ROS Review of Systems  Constitutional:  Negative for fever.  Respiratory:  Negative for shortness of breath.   Cardiovascular:  Negative for chest pain.  Musculoskeletal:  Negative for arthralgias.  Skin:  Negative for rash.    Objective:  BP 132/67   Pulse 63   Temp 98.2 F (36.8 C)   Ht 6' (1.829 m)   Wt 222 lb (100.7 kg)   SpO2 94%   BMI 30.11 kg/m   BP Readings from Last 3 Encounters:  06/18/22 132/67  05/24/22 139/82  05/24/22 (!) 155/80    Wt Readings from Last 3 Encounters:  06/18/22 222 lb (100.7 kg)  05/24/22 220 lb (99.8 kg)  05/24/22 220 lb 7.4 oz (100 kg)  Physical Exam Vitals reviewed.  Constitutional:      Appearance: He is well-developed.  HENT:     Head: Normocephalic and atraumatic.     Right Ear: External ear normal.     Left Ear: External ear normal.     Mouth/Throat:     Pharynx: No oropharyngeal exudate or posterior oropharyngeal erythema.  Eyes:     Pupils: Pupils are equal, round, and reactive to light.  Cardiovascular:     Rate and Rhythm:  Normal rate and regular rhythm.     Heart sounds: No murmur heard. Pulmonary:     Effort: No respiratory distress.     Breath sounds: Normal breath sounds.  Musculoskeletal:     Cervical back: Normal range of motion and neck supple.  Neurological:     Mental Status: He is alert and oriented to person, place, and time.     Diabetic Foot Exam - Simple   No data filed     Lab Results  Component Value Date   HGBA1C 6.3 (H) 10/29/2021   HGBA1C 6.6 (H) 07/23/2021   HGBA1C 6.7 08/29/2020    Assessment & Plan:   Arwin was seen today for medical management of chronic issues.  Diagnoses and all orders for this visit:  Controlled type 2 diabetes mellitus with complication, without long-term current use of insulin (HCC) -     Bayer DCA Hb A1c Waived -     Microalbumin / creatinine urine ratio  Mixed hyperlipidemia -     Lipid panel  Essential hypertension, benign -     CMP14+EGFR -     CBC with Differential/Platelet -     amLODipine (NORVASC) 5 MG tablet; TAKE 1 & 1/2 TABLETS ONCE A DAY IN THE MORNING -     metoprolol succinate (TOPROL-XL) 100 MG 24 hr tablet; Take with or immediately following a meal.  Hypertension -     CMP14+EGFR -     CBC with Differential/Platelet -     amLODipine (NORVASC) 5 MG tablet; TAKE 1 & 1/2 TABLETS ONCE A DAY IN THE MORNING -     metoprolol succinate (TOPROL-XL) 100 MG 24 hr tablet; Take with or immediately following a meal.  Other orders -     metFORMIN (GLUCOPHAGE) 500 MG tablet; TAKE ONE TABLET ONCE DAILY WITH BREAKFAST (NEEDS TO BE SEEN BEFORE NEXT REFILL)   I am having Daiva Eves maintain his Vitamin D, aspirin, blood glucose meter kit and supplies, Accu-Chek Guide, lisinopril, pravastatin, ondansetron, amLODipine, metFORMIN, and metoprolol succinate.  Meds ordered this encounter  Medications   amLODipine (NORVASC) 5 MG tablet    Sig: TAKE 1 & 1/2 TABLETS ONCE A DAY IN THE MORNING    Dispense:  135 tablet    Refill:  3   metFORMIN  (GLUCOPHAGE) 500 MG tablet    Sig: TAKE ONE TABLET ONCE DAILY WITH BREAKFAST (NEEDS TO BE SEEN BEFORE NEXT REFILL)    Dispense:  90 tablet    Refill:  3   metoprolol succinate (TOPROL-XL) 100 MG 24 hr tablet    Sig: Take with or immediately following a meal.    Dispense:  90 tablet    Refill:  2     Follow-up: Return in about 6 months (around 12/17/2022).  Claretta Fraise, M.D.

## 2022-06-19 LAB — MICROALBUMIN / CREATININE URINE RATIO
Creatinine, Urine: 180.4 mg/dL
Microalb/Creat Ratio: 23 mg/g creat (ref 0–29)
Microalbumin, Urine: 40.6 ug/mL

## 2022-07-03 DIAGNOSIS — X32XXXD Exposure to sunlight, subsequent encounter: Secondary | ICD-10-CM | POA: Diagnosis not present

## 2022-07-03 DIAGNOSIS — L570 Actinic keratosis: Secondary | ICD-10-CM | POA: Diagnosis not present

## 2022-07-03 DIAGNOSIS — D0471 Carcinoma in situ of skin of right lower limb, including hip: Secondary | ICD-10-CM | POA: Diagnosis not present

## 2022-07-03 DIAGNOSIS — C44712 Basal cell carcinoma of skin of right lower limb, including hip: Secondary | ICD-10-CM | POA: Diagnosis not present

## 2022-08-14 DIAGNOSIS — Z85828 Personal history of other malignant neoplasm of skin: Secondary | ICD-10-CM | POA: Diagnosis not present

## 2022-08-14 DIAGNOSIS — X32XXXD Exposure to sunlight, subsequent encounter: Secondary | ICD-10-CM | POA: Diagnosis not present

## 2022-08-14 DIAGNOSIS — Z08 Encounter for follow-up examination after completed treatment for malignant neoplasm: Secondary | ICD-10-CM | POA: Diagnosis not present

## 2022-08-14 DIAGNOSIS — L570 Actinic keratosis: Secondary | ICD-10-CM | POA: Diagnosis not present

## 2022-10-12 ENCOUNTER — Other Ambulatory Visit: Payer: Self-pay | Admitting: Family Medicine

## 2022-10-12 DIAGNOSIS — E118 Type 2 diabetes mellitus with unspecified complications: Secondary | ICD-10-CM

## 2022-10-12 DIAGNOSIS — I10 Essential (primary) hypertension: Secondary | ICD-10-CM

## 2022-10-25 ENCOUNTER — Telehealth: Payer: Self-pay | Admitting: Family Medicine

## 2022-10-25 NOTE — Telephone Encounter (Signed)
Left message for patient to call back and schedule Medicare Annual Wellness Visit (AWV) to be completed by video or phone.   Last AWV: 08/28/2021   Please schedule at anytime with [place]     Any questions, please contact me at [phone]   Thank you,   [facility] Ambulatory Clinical Support for [facility] ??[phone] or ??[phone]

## 2022-11-20 DIAGNOSIS — C44612 Basal cell carcinoma of skin of right upper limb, including shoulder: Secondary | ICD-10-CM | POA: Diagnosis not present

## 2022-11-20 DIAGNOSIS — Z08 Encounter for follow-up examination after completed treatment for malignant neoplasm: Secondary | ICD-10-CM | POA: Diagnosis not present

## 2022-11-20 DIAGNOSIS — Z1283 Encounter for screening for malignant neoplasm of skin: Secondary | ICD-10-CM | POA: Diagnosis not present

## 2022-11-20 DIAGNOSIS — Z85828 Personal history of other malignant neoplasm of skin: Secondary | ICD-10-CM | POA: Diagnosis not present

## 2022-12-19 ENCOUNTER — Encounter: Payer: Self-pay | Admitting: Family Medicine

## 2022-12-19 ENCOUNTER — Telehealth: Payer: Self-pay | Admitting: Family Medicine

## 2022-12-19 ENCOUNTER — Ambulatory Visit (INDEPENDENT_AMBULATORY_CARE_PROVIDER_SITE_OTHER): Payer: Medicare Other | Admitting: Family Medicine

## 2022-12-19 VITALS — BP 144/76 | HR 71 | Temp 97.3°F | Ht 72.0 in | Wt 223.0 lb

## 2022-12-19 DIAGNOSIS — Z7984 Long term (current) use of oral hypoglycemic drugs: Secondary | ICD-10-CM

## 2022-12-19 DIAGNOSIS — E782 Mixed hyperlipidemia: Secondary | ICD-10-CM

## 2022-12-19 DIAGNOSIS — I10 Essential (primary) hypertension: Secondary | ICD-10-CM | POA: Diagnosis not present

## 2022-12-19 DIAGNOSIS — R972 Elevated prostate specific antigen [PSA]: Secondary | ICD-10-CM | POA: Diagnosis not present

## 2022-12-19 DIAGNOSIS — E118 Type 2 diabetes mellitus with unspecified complications: Secondary | ICD-10-CM | POA: Diagnosis not present

## 2022-12-19 DIAGNOSIS — E559 Vitamin D deficiency, unspecified: Secondary | ICD-10-CM | POA: Diagnosis not present

## 2022-12-19 LAB — BAYER DCA HB A1C WAIVED: HB A1C (BAYER DCA - WAIVED): 6.6 % — ABNORMAL HIGH (ref 4.8–5.6)

## 2022-12-19 MED ORDER — LISINOPRIL 30 MG PO TABS: 30.0000 mg | ORAL_TABLET | Freq: Every day | ORAL | 3 refills | Status: DC

## 2022-12-19 MED ORDER — PRAVASTATIN SODIUM 20 MG PO TABS: 20.0000 mg | ORAL_TABLET | Freq: Every day | ORAL | 3 refills | Status: DC

## 2022-12-19 MED ORDER — LISINOPRIL 20 MG PO TABS: 20.0000 mg | ORAL_TABLET | Freq: Every day | ORAL | 3 refills | Status: DC

## 2022-12-19 NOTE — Progress Notes (Signed)
Subjective:  Patient ID: [name],  male    DOB: [birth year]  Age: 72 y.o.    CC: Medical Management of Chronic Issues   HPI [name] presents for  follow-up of hypertension. Patient has no history of headache chest pain or shortness of breath or recent cough. Patient also denies symptoms of TIA such as numbness weakness lateralizing. Patient denies side effects from medication. States taking it regularly.  Patient also  in for follow-up of elevated cholesterol. Doing well without complaints on current medication. Denies side effects  including myalgia and arthralgia and nausea. Also in today for liver function testing. Currently no chest pain, shortness of breath or other cardiovascular related symptoms noted.  Follow-up of diabetes. Patient does check blood sugar at home. Readings run between 110 and 140 Patient denies symptoms such as excessive hunger or urinary frequency, excessive hunger, nausea No significant hypoglycemic spells noted. Medications reviewed. Pt reports taking them regularly. Pt. denies complication/adverse reaction today.    History [name] has a past medical history of Arthritis, Atypical small acinar proliferation of prostate, Benign paroxysmal positional vertigo, Diabetes mellitus without complication ([place]), Elevated PSA, Essential hypertension, benign, GERD (gastroesophageal reflux disease), History of basal cell carcinoma excision, History of hiatal hernia, History of kidney stones, History of melanoma excision, History of small bowel obstruction, Hyperlipidemia, LAFB (left anterior fascicular block), and Prediabetes.   He has a past surgical history that includes Lumbar disc surgery (x3   last one 1980's); Gastric fundoplication ([id number]); and Prostate biopsy (N/A, 11/21/2015).   His family history includes Diabetes in his father; Hypertension in his father and mother; Melanoma in his mother.He reports that he has never smoked. He has never used smokeless  tobacco. He reports current alcohol use. He reports that he does not use drugs.  Current Outpatient Medications on File Prior to Visit  Medication Sig Dispense Refill   ACCU-CHEK GUIDE test strip CHECK BLOOD SUGAR UP TO 4 TIMES A DAY 100 strip 0   amLODipine (NORVASC) 5 MG tablet TAKE 1 & 1/2 TABLETS ONCE A DAY IN THE MORNING 135 tablet 3   aspirin 81 MG tablet Take 81 mg by mouth daily.     blood glucose meter kit and supplies KIT Dispense based on patient and insurance preference. Use up to four times daily as directed. (FOR ICD-10 : E11.9 1 each 0   Cholecalciferol (VITAMIN D) 2000 UNITS CAPS Take 1 capsule by mouth daily.      metFORMIN (GLUCOPHAGE) 500 MG tablet TAKE ONE TABLET ONCE DAILY WITH BREAKFAST (NEEDS TO BE SEEN BEFORE NEXT REFILL) 90 tablet 3   metoprolol succinate (TOPROL-XL) 100 MG 24 hr tablet Take with or immediately following a meal. 90 tablet 2   No current facility-administered medications on file prior to visit.    ROS Review of Systems  Constitutional:  Negative for fever.  Respiratory:  Negative for shortness of breath.   Cardiovascular:  Negative for chest pain.  Musculoskeletal:  Negative for arthralgias.  Skin:  Negative for rash.    Objective:  BP (!) 144/76   Pulse 71   Temp (!) 97.3 F (36.3 C)   Ht 6' (1.829 m)   Wt 223 lb (101.2 kg)   SpO2 96%   BMI 30.24 kg/m   BP Readings from Last 3 Encounters:  12/19/22 (!) 144/76  06/18/22 132/67  05/24/22 139/82    Wt Readings from Last 3 Encounters:  12/19/22 223 lb (101.2 kg)  06/18/22 222 lb (100.7 kg)  05/24/22 220 lb (99.8 kg)     Physical Exam Vitals reviewed.  Constitutional:      Appearance: He is well-developed.  HENT:     Head: Normocephalic and atraumatic.     Right Ear: External ear normal.     Left Ear: External ear normal.     Mouth/Throat:     Pharynx: No oropharyngeal exudate or posterior oropharyngeal erythema.  Eyes:     Pupils: Pupils are equal, round, and reactive to  light.  Cardiovascular:     Rate and Rhythm: Normal rate and regular rhythm.     Heart sounds: No murmur heard. Pulmonary:     Effort: No respiratory distress.     Breath sounds: Normal breath sounds.  Musculoskeletal:     Cervical back: Normal range of motion and neck supple.  Neurological:     Mental Status: He is alert and oriented to person, place, and time.     Diabetic Foot Exam - Simple   Simple Foot Form Diabetic Foot exam was performed with the following findings: Yes 12/19/2022  8:35 AM  Visual Inspection No deformities, no ulcerations, no other skin breakdown bilaterally: Yes Sensation Testing Intact to touch and monofilament testing bilaterally: Yes Pulse Check Posterior Tibialis and Dorsalis pulse intact bilaterally: Yes Comments     Lab Results  Component Value Date   HGBA1C 6.5 (H) 06/18/2022   HGBA1C 6.3 (H) 10/29/2021   HGBA1C 6.6 (H) 07/23/2021    Assessment & Plan:   [name] was seen today for medical management of chronic issues.  Diagnoses and all orders for this visit:  Mixed hyperlipidemia -     Lipid panel -     pravastatin (PRAVACHOL) 20 MG tablet; Take 1 tablet (20 mg total) by mouth daily.  Essential hypertension, benign -     CBC with Differential/Platelet -     CMP14+EGFR -     Discontinue: lisinopril (ZESTRIL) 20 MG tablet; Take 1 tablet (20 mg total) by mouth daily. -     lisinopril (ZESTRIL) 30 MG tablet; Take 1 tablet (30 mg total) by mouth daily.  Controlled type 2 diabetes mellitus with complication, without long-term current use of insulin (HCC) -     Bayer DCA Hb A1c Waived -     Discontinue: lisinopril (ZESTRIL) 20 MG tablet; Take 1 tablet (20 mg total) by mouth daily. -     pravastatin (PRAVACHOL) 20 MG tablet; Take 1 tablet (20 mg total) by mouth daily. -     lisinopril (ZESTRIL) 30 MG tablet; Take 1 tablet (30 mg total) by mouth daily.  Elevated PSA -     PSA, total and free  Vitamin D deficiency -     VITAMIN D 25  Hydroxy (Vit-D Deficiency, Fractures)  Hypertension -     CBC with Differential/Platelet -     CMP14+EGFR -     Discontinue: lisinopril (ZESTRIL) 20 MG tablet; Take 1 tablet (20 mg total) by mouth daily. -     lisinopril (ZESTRIL) 30 MG tablet; Take 1 tablet (30 mg total) by mouth daily.   I have discontinued Vershawn Pohlman's ondansetron and lisinopril. I have also changed his lisinopril. Additionally, I am having him maintain his Vitamin D, aspirin, blood glucose meter kit and supplies, Accu-Chek Guide, amLODipine, metFORMIN, metoprolol succinate, and pravastatin.  Meds ordered this encounter  Medications   DISCONTD: lisinopril (ZESTRIL) 20 MG tablet    Sig: Take 1 tablet (20 mg total) by mouth daily.    Dispense:  90 tablet    Refill:  3   pravastatin (PRAVACHOL) 20 MG tablet    Sig: Take 1 tablet (20 mg total) by mouth daily.    Dispense:  90 tablet    Refill:  3   lisinopril (ZESTRIL) 30 MG tablet    Sig: Take 1 tablet (30 mg total) by mouth daily.    Dispense:  90 tablet    Refill:  3     Follow-up: Return in about 6 months (around 06/21/2023).  Claretta Fraise, M.D.

## 2022-12-19 NOTE — Telephone Encounter (Signed)
CLARIFICATION GIVEN TO PHARMACY

## 2022-12-20 LAB — CBC WITH DIFFERENTIAL/PLATELET
Basophils Absolute: 0.1 10*3/uL (ref 0.0–0.2)
Basos: 1 %
EOS (ABSOLUTE): 0.1 10*3/uL (ref 0.0–0.4)
Eos: 2 %
Hematocrit: 46.1 % (ref 37.5–51.0)
Hemoglobin: 15.9 g/dL (ref 13.0–17.7)
Immature Grans (Abs): 0 10*3/uL (ref 0.0–0.1)
Immature Granulocytes: 0 %
Lymphocytes Absolute: 1.6 10*3/uL (ref 0.7–3.1)
Lymphs: 29 %
MCH: 31.5 pg (ref 26.6–33.0)
MCHC: 34.5 g/dL (ref 31.5–35.7)
MCV: 92 fL (ref 79–97)
Monocytes Absolute: 0.4 10*3/uL (ref 0.1–0.9)
Monocytes: 8 %
Neutrophils Absolute: 3.5 10*3/uL (ref 1.4–7.0)
Neutrophils: 60 %
Platelets: 188 10*3/uL (ref 150–450)
RBC: 5.04 x10E6/uL (ref 4.14–5.80)
RDW: 13.5 % (ref 11.6–15.4)
WBC: 5.7 10*3/uL (ref 3.4–10.8)

## 2022-12-20 LAB — CMP14+EGFR
ALT: 20 IU/L (ref 0–44)
AST: 17 IU/L (ref 0–40)
Albumin/Globulin Ratio: 1.4 (ref 1.2–2.2)
Albumin: 4.1 g/dL (ref 3.8–4.8)
Alkaline Phosphatase: 131 IU/L — ABNORMAL HIGH (ref 44–121)
BUN/Creatinine Ratio: 18 (ref 10–24)
BUN: 19 mg/dL (ref 8–27)
Bilirubin Total: 0.7 mg/dL (ref 0.0–1.2)
CO2: 23 mmol/L (ref 20–29)
Calcium: 9.5 mg/dL (ref 8.6–10.2)
Chloride: 103 mmol/L (ref 96–106)
Creatinine, Ser: 1.03 mg/dL (ref 0.76–1.27)
Globulin, Total: 2.9 g/dL (ref 1.5–4.5)
Glucose: 157 mg/dL — ABNORMAL HIGH (ref 70–99)
Potassium: 4.1 mmol/L (ref 3.5–5.2)
Sodium: 141 mmol/L (ref 134–144)
Total Protein: 7 g/dL (ref 6.0–8.5)
eGFR: 77 mL/min/{1.73_m2} (ref >59)

## 2022-12-20 LAB — LIPID PANEL
Chol/HDL Ratio: 2.5 ratio (ref 0.0–5.0)
Cholesterol, Total: 107 mg/dL (ref 100–199)
HDL: 42 mg/dL (ref >39)
LDL Chol Calc (NIH): 47 mg/dL (ref 0–99)
Triglycerides: 90 mg/dL (ref 0–149)
VLDL Cholesterol Cal: 18 mg/dL (ref 5–40)

## 2022-12-20 LAB — VITAMIN D 25 HYDROXY (VIT D DEFICIENCY, FRACTURES): Vit D, 25-Hydroxy: 52.7 ng/mL (ref 30.0–100.0)

## 2022-12-20 LAB — PSA, TOTAL AND FREE
PSA, Free Pct: 24.6 %
PSA, Free: 1.01 ng/mL
Prostate Specific Ag, Serum: 4.1 ng/mL — ABNORMAL HIGH (ref 0.0–4.0)

## 2023-02-19 DIAGNOSIS — D485 Neoplasm of uncertain behavior of skin: Secondary | ICD-10-CM | POA: Diagnosis not present

## 2023-02-19 DIAGNOSIS — D2271 Melanocytic nevi of right lower limb, including hip: Secondary | ICD-10-CM | POA: Diagnosis not present

## 2023-02-19 DIAGNOSIS — C44519 Basal cell carcinoma of skin of other part of trunk: Secondary | ICD-10-CM | POA: Diagnosis not present

## 2023-02-19 DIAGNOSIS — Z08 Encounter for follow-up examination after completed treatment for malignant neoplasm: Secondary | ICD-10-CM | POA: Diagnosis not present

## 2023-02-19 DIAGNOSIS — Z85828 Personal history of other malignant neoplasm of skin: Secondary | ICD-10-CM | POA: Diagnosis not present

## 2023-02-19 DIAGNOSIS — Z85820 Personal history of malignant melanoma of skin: Secondary | ICD-10-CM | POA: Diagnosis not present

## 2023-02-19 DIAGNOSIS — D225 Melanocytic nevi of trunk: Secondary | ICD-10-CM | POA: Diagnosis not present

## 2023-02-19 DIAGNOSIS — Z1283 Encounter for screening for malignant neoplasm of skin: Secondary | ICD-10-CM | POA: Diagnosis not present

## 2023-03-05 DIAGNOSIS — L98499 Non-pressure chronic ulcer of skin of other sites with unspecified severity: Secondary | ICD-10-CM | POA: Diagnosis not present

## 2023-03-05 DIAGNOSIS — L570 Actinic keratosis: Secondary | ICD-10-CM | POA: Diagnosis not present

## 2023-03-05 DIAGNOSIS — D0461 Carcinoma in situ of skin of right upper limb, including shoulder: Secondary | ICD-10-CM | POA: Diagnosis not present

## 2023-03-05 DIAGNOSIS — X32XXXD Exposure to sunlight, subsequent encounter: Secondary | ICD-10-CM | POA: Diagnosis not present

## 2023-03-05 DIAGNOSIS — D485 Neoplasm of uncertain behavior of skin: Secondary | ICD-10-CM | POA: Diagnosis not present

## 2023-03-05 DIAGNOSIS — C4441 Basal cell carcinoma of skin of scalp and neck: Secondary | ICD-10-CM | POA: Diagnosis not present

## 2023-04-16 DIAGNOSIS — Z85828 Personal history of other malignant neoplasm of skin: Secondary | ICD-10-CM | POA: Diagnosis not present

## 2023-04-16 DIAGNOSIS — Z08 Encounter for follow-up examination after completed treatment for malignant neoplasm: Secondary | ICD-10-CM | POA: Diagnosis not present

## 2023-04-16 DIAGNOSIS — Z85820 Personal history of malignant melanoma of skin: Secondary | ICD-10-CM | POA: Diagnosis not present

## 2023-04-19 ENCOUNTER — Other Ambulatory Visit: Payer: Self-pay | Admitting: Family Medicine

## 2023-04-19 DIAGNOSIS — I10 Essential (primary) hypertension: Secondary | ICD-10-CM

## 2023-06-19 ENCOUNTER — Other Ambulatory Visit: Payer: Self-pay | Admitting: Family Medicine

## 2023-06-19 ENCOUNTER — Encounter: Payer: Self-pay | Admitting: Family Medicine

## 2023-06-19 ENCOUNTER — Ambulatory Visit: Payer: Medicare Other | Admitting: Family Medicine

## 2023-06-19 VITALS — BP 137/70 | HR 71 | Temp 97.0°F | Ht 72.0 in | Wt 219.4 lb

## 2023-06-19 DIAGNOSIS — I10 Essential (primary) hypertension: Secondary | ICD-10-CM

## 2023-06-19 DIAGNOSIS — E118 Type 2 diabetes mellitus with unspecified complications: Secondary | ICD-10-CM

## 2023-06-19 DIAGNOSIS — Z23 Encounter for immunization: Secondary | ICD-10-CM | POA: Diagnosis not present

## 2023-06-19 DIAGNOSIS — Z7984 Long term (current) use of oral hypoglycemic drugs: Secondary | ICD-10-CM | POA: Diagnosis not present

## 2023-06-19 DIAGNOSIS — E782 Mixed hyperlipidemia: Secondary | ICD-10-CM

## 2023-06-19 LAB — LIPID PANEL

## 2023-06-19 MED ORDER — METFORMIN HCL 500 MG PO TABS
ORAL_TABLET | ORAL | 3 refills | Status: DC
Start: 2023-06-19 — End: 2023-10-15

## 2023-06-19 MED ORDER — AMLODIPINE BESYLATE 10 MG PO TABS: 10.0000 mg | ORAL_TABLET | Freq: Every day | ORAL | 3 refills | Status: DC

## 2023-06-19 MED ORDER — BLOOD GLUCOSE MONITOR KIT: PACK | 0 refills | Status: DC

## 2023-06-19 NOTE — Progress Notes (Signed)
Subjective:  Patient ID: [name],  male    DOB: [birth year]  Age: 72 y.o.    CC: Medical Management of Chronic Issues   HPI [name] presents for  follow-up of hypertension. Patient has no history of headache chest pain or shortness of breath or recent cough. Patient also denies symptoms of TIA such as numbness weakness lateralizing. Patient denies side effects from medication. States taking it regularly.  Patient also  in for follow-up of elevated cholesterol. Doing well without complaints on current medication. Denies side effects  including myalgia and arthralgia and nausea. Also in today for liver function testing. Currently no chest pain, shortness of breath or other cardiovascular related symptoms noted.  Follow-up of diabetes. Patient does check blood sugar at home. Readings run between 110 and 160 random Patient denies symptoms such as excessive hunger or urinary frequency, excessive hunger, nausea No significant hypoglycemic spells noted. Medications reviewed. Pt reports taking them regularly. Pt. denies complication/adverse reaction today.    History [name] has a past medical history of Arthritis, Atypical small acinar proliferation of prostate, Benign paroxysmal positional vertigo, Diabetes mellitus without complication (HCC), Elevated PSA, Essential hypertension, benign, GERD (gastroesophageal reflux disease), History of basal cell carcinoma excision, History of hiatal hernia, History of kidney stones, History of melanoma excision, History of small bowel obstruction, Hyperlipidemia, LAFB (left anterior fascicular block), and Prediabetes.   [name] has a past surgical history that includes Lumbar disc surgery (x3   last one 1980's); Gastric fundoplication (1970's); and Prostate biopsy (N/A, 11/21/2015).   His family history includes Diabetes in his father; Hypertension in his father and mother; Melanoma in his mother.[name] reports that [name] has never smoked. [name] has never used  smokeless tobacco. [name] reports current alcohol use. [name] reports that [name] does not use drugs.  Current Outpatient Medications on File Prior to Visit  Medication Sig Dispense Refill   ACCU-CHEK GUIDE test strip CHECK BLOOD SUGAR UP TO 4 TIMES A DAY 100 strip 0   aspirin 81 MG tablet Take 81 mg by mouth daily.     Cholecalciferol (VITAMIN D) 2000 UNITS CAPS Take 1 capsule by mouth daily.      lisinopril (ZESTRIL) 30 MG tablet Take 1 tablet (30 mg total) by mouth daily. 90 tablet 3   metoprolol succinate (TOPROL-XL) 100 MG 24 hr tablet TAKE ONE TABLET WITH OR immediately following a meal 90 tablet 0   pravastatin (PRAVACHOL) 20 MG tablet Take 1 tablet (20 mg total) by mouth daily. 90 tablet 3   No current facility-administered medications on file prior to visit.    ROS Review of Systems  Constitutional:  Negative for fever.  Respiratory:  Negative for shortness of breath.   Cardiovascular:  Negative for chest pain.  Musculoskeletal:  Negative for arthralgias.  Skin:  Negative for rash.    Objective:  BP 137/70   Pulse 71   Temp (!) 97 F (36.1 C)   Ht 6' (1.829 m)   Wt 219 lb 6.4 oz (99.5 kg)   SpO2 95%   BMI 29.76 kg/m   BP Readings from Last 3 Encounters:  06/19/23 137/70  12/19/22 (!) 144/76  06/18/22 132/67    Wt Readings from Last 3 Encounters:  06/19/23 219 lb 6.4 oz (99.5 kg)  12/19/22 223 lb (101.2 kg)  06/18/22 222 lb (100.7 kg)     Physical Exam Vitals reviewed.  Constitutional:      Appearance: [name] is well-developed.  HENT:     Head:  Normocephalic and atraumatic.     Right Ear: External ear normal.     Left Ear: External ear normal.     Mouth/Throat:     Pharynx: No oropharyngeal exudate or posterior oropharyngeal erythema.  Eyes:     Pupils: Pupils are equal, round, and reactive to light.  Cardiovascular:     Rate and Rhythm: Normal rate and regular rhythm.     Heart sounds: No murmur heard. Pulmonary:     Effort: No respiratory distress.     Breath  sounds: Normal breath sounds.  Musculoskeletal:     Cervical back: Normal range of motion and neck supple.  Neurological:     Mental Status: [name] is alert and oriented to person, place, and time.     Diabetic Foot Exam - Simple   No data filed     Lab Results  Component Value Date   HGBA1C 6.6 (H) 12/19/2022   HGBA1C 6.5 (H) 06/18/2022   HGBA1C 6.3 (H) 10/29/2021    Assessment & Plan:   [name] was seen today for medical management of chronic issues.  Diagnoses and all orders for this visit:  Mixed hyperlipidemia -     Lipid panel  Essential hypertension, benign -     CBC with Differential/Platelet -     CMP14+EGFR -     amLODipine (NORVASC) 10 MG tablet; Take 1 tablet (10 mg total) by mouth daily.  Controlled type 2 diabetes mellitus with complication, without long-term current use of insulin (HCC) -     Bayer DCA Hb A1c Waived -     Microalbumin / creatinine urine ratio -     blood glucose meter kit and supplies KIT; Dispense based on patient and insurance preference. Use up to four times daily as directed. (FOR ICD-10 : E11.9  Hypertension -     CBC with Differential/Platelet -     CMP14+EGFR -     amLODipine (NORVASC) 10 MG tablet; Take 1 tablet (10 mg total) by mouth daily.  Other orders -     metFORMIN (GLUCOPHAGE) 500 MG tablet; TAKE ONE TABLET ONCE DAILY WITH BREAKFAST (NEEDS TO BE SEEN BEFORE NEXT REFILL)   I have changed [name]'s amLODipine. I am also having him maintain his Vitamin D, aspirin, Accu-Chek Guide, pravastatin, lisinopril, metoprolol succinate, metFORMIN, and blood glucose meter kit and supplies.  Meds ordered this encounter  Medications   amLODipine (NORVASC) 10 MG tablet    Sig: Take 1 tablet (10 mg total) by mouth daily.    Dispense:  90 tablet    Refill:  3   metFORMIN (GLUCOPHAGE) 500 MG tablet    Sig: TAKE ONE TABLET ONCE DAILY WITH BREAKFAST (NEEDS TO BE SEEN BEFORE NEXT REFILL)    Dispense:  90 tablet    Refill:  3   blood  glucose meter kit and supplies KIT    Sig: Dispense based on patient and insurance preference. Use up to four times daily as directed. (FOR ICD-10 : E11.9    Dispense:  1 each    Refill:  0    Order Specific Question:   Number of strips    Answer:   100    Order Specific Question:   Number of lancets    Answer:   100   Discussed proper BID way to check glucose. Record and bring to office.  Follow-up: Return in about 3 months (around 09/18/2023) for diabetes.  Mechele Claude, M.D.

## 2023-06-20 LAB — CBC WITH DIFFERENTIAL/PLATELET
Basophils Absolute: 0.1 10*3/uL (ref 0.0–0.2)
Basos: 1 %
EOS (ABSOLUTE): 0.1 10*3/uL (ref 0.0–0.4)
Eos: 2 %
Hematocrit: 48.1 % (ref 37.5–51.0)
Hemoglobin: 15.5 g/dL (ref 13.0–17.7)
Immature Grans (Abs): 0 10*3/uL (ref 0.0–0.1)
Immature Granulocytes: 0 %
Lymphocytes Absolute: 1.6 10*3/uL (ref 0.7–3.1)
Lymphs: 33 %
MCH: 30.2 pg (ref 26.6–33.0)
MCHC: 32.2 g/dL (ref 31.5–35.7)
MCV: 94 fL (ref 79–97)
Monocytes Absolute: 0.5 10*3/uL (ref 0.1–0.9)
Monocytes: 10 %
Neutrophils Absolute: 2.6 10*3/uL (ref 1.4–7.0)
Neutrophils: 54 %
Platelets: 173 10*3/uL (ref 150–450)
RBC: 5.13 x10E6/uL (ref 4.14–5.80)
RDW: 13.2 % (ref 11.6–15.4)
WBC: 4.9 10*3/uL (ref 3.4–10.8)

## 2023-06-20 LAB — MICROALBUMIN / CREATININE URINE RATIO
Creatinine, Urine: 91 mg/dL
Microalb/Creat Ratio: 28 mg/g creat (ref 0–29)
Microalbumin, Urine: 25.6 ug/mL

## 2023-06-20 LAB — LIPID PANEL
Chol/HDL Ratio: 3.3 ratio (ref 0.0–5.0)
Cholesterol, Total: 128 mg/dL (ref 100–199)
HDL: 39 mg/dL — ABNORMAL LOW (ref >39)
LDL Chol Calc (NIH): 66 mg/dL (ref 0–99)
Triglycerides: 127 mg/dL (ref 0–149)
VLDL Cholesterol Cal: 23 mg/dL (ref 5–40)

## 2023-06-20 LAB — CMP14+EGFR
ALT: 23 IU/L (ref 0–44)
AST: 18 IU/L (ref 0–40)
Albumin: 4.2 g/dL (ref 3.8–4.8)
Alkaline Phosphatase: 134 IU/L — ABNORMAL HIGH (ref 44–121)
BUN/Creatinine Ratio: 16 (ref 10–24)
BUN: 17 mg/dL (ref 8–27)
Bilirubin Total: 0.5 mg/dL (ref 0.0–1.2)
CO2: 24 mmol/L (ref 20–29)
Calcium: 9.3 mg/dL (ref 8.6–10.2)
Chloride: 101 mmol/L (ref 96–106)
Creatinine, Ser: 1.06 mg/dL (ref 0.76–1.27)
Globulin, Total: 2.8 g/dL (ref 1.5–4.5)
Glucose: 152 mg/dL — ABNORMAL HIGH (ref 70–99)
Potassium: 3.9 mmol/L (ref 3.5–5.2)
Sodium: 141 mmol/L (ref 134–144)
Total Protein: 7 g/dL (ref 6.0–8.5)
eGFR: 74 mL/min/{1.73_m2} (ref >59)

## 2023-06-20 LAB — BAYER DCA HB A1C WAIVED: HB A1C (BAYER DCA - WAIVED): 7 % — ABNORMAL HIGH (ref 4.8–5.6)

## 2023-06-24 NOTE — Progress Notes (Signed)
Hello Anikin,  Your lab result is normal and/or stable.Some minor variations that are not significant are commonly marked abnormal, but do not represent any medical problem for you.  Best regards, Mechele Claude, M.D.

## 2023-07-19 ENCOUNTER — Other Ambulatory Visit: Payer: Self-pay | Admitting: Family Medicine

## 2023-07-19 DIAGNOSIS — I10 Essential (primary) hypertension: Secondary | ICD-10-CM

## 2023-08-06 LAB — HM DIABETES EYE EXAM

## 2023-08-26 ENCOUNTER — Encounter: Payer: Self-pay | Admitting: *Deleted

## 2023-09-17 NOTE — Telephone Encounter (Signed)
Results abstracted and care team has been updated.

## 2023-10-02 ENCOUNTER — Ambulatory Visit: Payer: Medicare Other | Admitting: Family Medicine

## 2023-10-08 DIAGNOSIS — Z08 Encounter for follow-up examination after completed treatment for malignant neoplasm: Secondary | ICD-10-CM | POA: Diagnosis not present

## 2023-10-08 DIAGNOSIS — Z85828 Personal history of other malignant neoplasm of skin: Secondary | ICD-10-CM | POA: Diagnosis not present

## 2023-10-08 DIAGNOSIS — X32XXXD Exposure to sunlight, subsequent encounter: Secondary | ICD-10-CM | POA: Diagnosis not present

## 2023-10-08 DIAGNOSIS — L570 Actinic keratosis: Secondary | ICD-10-CM | POA: Diagnosis not present

## 2023-10-09 ENCOUNTER — Other Ambulatory Visit: Payer: Self-pay | Admitting: Family Medicine

## 2023-10-09 DIAGNOSIS — I10 Essential (primary) hypertension: Secondary | ICD-10-CM

## 2023-10-15 ENCOUNTER — Encounter: Payer: Self-pay | Admitting: Family Medicine

## 2023-10-15 ENCOUNTER — Ambulatory Visit (INDEPENDENT_AMBULATORY_CARE_PROVIDER_SITE_OTHER): Payer: Medicare Other | Admitting: Family Medicine

## 2023-10-15 VITALS — BP 131/75 | HR 70 | Temp 98.2°F | Ht 72.0 in | Wt 225.0 lb

## 2023-10-15 DIAGNOSIS — I152 Hypertension secondary to endocrine disorders: Secondary | ICD-10-CM

## 2023-10-15 DIAGNOSIS — E1169 Type 2 diabetes mellitus with other specified complication: Secondary | ICD-10-CM

## 2023-10-15 DIAGNOSIS — E782 Mixed hyperlipidemia: Secondary | ICD-10-CM | POA: Diagnosis not present

## 2023-10-15 DIAGNOSIS — Z7985 Long-term (current) use of injectable non-insulin antidiabetic drugs: Secondary | ICD-10-CM | POA: Diagnosis not present

## 2023-10-15 DIAGNOSIS — E756 Lipid storage disorder, unspecified: Secondary | ICD-10-CM

## 2023-10-15 DIAGNOSIS — E118 Type 2 diabetes mellitus with unspecified complications: Secondary | ICD-10-CM | POA: Diagnosis not present

## 2023-10-15 DIAGNOSIS — I10 Essential (primary) hypertension: Secondary | ICD-10-CM

## 2023-10-15 DIAGNOSIS — S39011A Strain of muscle, fascia and tendon of abdomen, initial encounter: Secondary | ICD-10-CM

## 2023-10-15 DIAGNOSIS — E1159 Type 2 diabetes mellitus with other circulatory complications: Secondary | ICD-10-CM

## 2023-10-15 LAB — LIPID PANEL

## 2023-10-15 LAB — BAYER DCA HB A1C WAIVED: HB A1C (BAYER DCA - WAIVED): 7.2 % — ABNORMAL HIGH (ref 4.8–5.6)

## 2023-10-15 MED ORDER — OZEMPIC (0.25 OR 0.5 MG/DOSE) 2 MG/3ML ~~LOC~~ SOPN
0.5000 mg | PEN_INJECTOR | SUBCUTANEOUS | 1 refills | Status: DC
Start: 2023-10-15 — End: 2023-12-19

## 2023-10-15 MED ORDER — DICLOFENAC SODIUM 75 MG PO TBEC
75.0000 mg | DELAYED_RELEASE_TABLET | Freq: Two times a day (BID) | ORAL | 2 refills | Status: DC
Start: 2023-10-15 — End: 2024-01-01

## 2023-10-15 NOTE — Progress Notes (Addendum)
Subjective:  Patient ID: Lymon Segerstrom,  male    DOB: 11-Jun-1951  Age: 73 y.o.    CC: Medical Management of Chronic Issues (No concers at this time. )   HPI Xavyer Sulewski presents for  follow-up of hypertension. Patient has no history of headache chest pain or shortness of breath or recent cough. Patient also denies symptoms of TIA such as numbness weakness lateralizing. Patient denies side effects from medication. States taking it regularly.  Patient also  in for follow-up of elevated cholesterol. Doing well without complaints on current medication. Denies side effects  including myalgia and arthralgia and nausea. Also in today for liver function testing. Currently no chest pain, shortness of breath or other cardiovascular related symptoms noted.  Follow-up of diabetes. Patient does not routinelycheck blood sugar at home.  Patient denies symptoms such as excessive hunger or urinary frequency, excessive hunger, nausea No significant hypoglycemic spells noted. Medications reviewed. Pt reports taking them regularly. Pt. denies complication/adverse reaction today.    History Adelfo has a past medical history of Arthritis, Atypical small acinar proliferation of prostate, Benign paroxysmal positional vertigo, Diabetes mellitus without complication ([facility]), Elevated PSA, Essential hypertension, benign, GERD (gastroesophageal reflux disease), History of basal cell carcinoma excision, History of hiatal hernia, History of kidney stones, History of melanoma excision, History of small bowel obstruction, Hyperlipidemia, LAFB (left anterior fascicular block), and Prediabetes.   He has a past surgical history that includes Lumbar disc surgery (x3   last one 1980's); Gastric fundoplication (1970's); and Prostate biopsy (N/A, 11/21/2015).   His family history includes Diabetes in his father; Hypertension in his father and mother; Melanoma in his mother.He reports that he has never smoked. He has never used  smokeless tobacco. He reports current alcohol use. He reports that he does not use drugs.  Current Outpatient Medications on File Prior to Visit  Medication Sig Dispense Refill   amLODipine (NORVASC) 10 MG tablet Take 1 tablet (10 mg total) by mouth daily. 90 tablet 3   aspirin 81 MG tablet Take 81 mg by mouth daily.     Blood Glucose Monitoring Suppl (ACCU-CHEK GUIDE) w/Device KIT Check BS up to 4 times a day Dx E11.8 1 kit 0   Cholecalciferol (VITAMIN D) 2000 UNITS CAPS Take 2 capsules by mouth daily.     glucose blood (ACCU-CHEK GUIDE) test strip Check BS up to 4 times a day Dx E11.8 400 strip 3   lisinopril (ZESTRIL) 30 MG tablet Take 1 tablet (30 mg total) by mouth daily. 90 tablet 3   metoprolol succinate (TOPROL-XL) 100 MG 24 hr tablet TAKE ONE TABLET DAILY WITH MEAL(S) 90 tablet 0   pravastatin (PRAVACHOL) 20 MG tablet Take 1 tablet (20 mg total) by mouth daily. 90 tablet 3   No current facility-administered medications on file prior to visit.    ROS Review of Systems  Constitutional:  Negative for fever.  Respiratory:  Negative for shortness of breath.   Cardiovascular:  Negative for chest pain.  Musculoskeletal:  Negative for arthralgias.  Skin:  Negative for rash.    Objective:  BP 131/75   Pulse 70   Temp 98.2 F (36.8 C) (Temporal)   Ht 6' (1.829 m)   Wt 225 lb (102.1 kg)   SpO2 97%   BMI 30.52 kg/m   BP Readings from Last 3 Encounters:  10/15/23 131/75  06/19/23 137/70  12/19/22 (!) 144/76    Wt Readings from Last 3 Encounters:  10/15/23 225 lb (102.1 kg)  06/19/23 219 lb 6.4 oz (99.5 kg)  12/19/22 223 lb (101.2 kg)     Physical Exam Vitals reviewed.  Constitutional:      Appearance: He is well-developed.  HENT:     Head: Normocephalic and atraumatic.     Right Ear: External ear normal.     Left Ear: External ear normal.     Mouth/Throat:     Pharynx: No oropharyngeal exudate or posterior oropharyngeal erythema.  Eyes:     Pupils: Pupils are  equal, round, and reactive to light.  Cardiovascular:     Rate and Rhythm: Normal rate and regular rhythm.     Heart sounds: No murmur heard. Pulmonary:     Effort: No respiratory distress.     Breath sounds: Normal breath sounds.  Musculoskeletal:     Cervical back: Normal range of motion and neck supple.  Neurological:     Mental Status: He is alert and oriented to person, place, and time.     Diabetic Foot Exam - Simple   No data filed     Lab Results  Component Value Date   HGBA1C 7.2 (H) 10/15/2023   HGBA1C 7.0 (H) 06/19/2023   HGBA1C 6.6 (H) 12/19/2022    Assessment & Plan:   Danyell was seen today for medical management of chronic issues.  Diagnoses and all orders for this visit:  Diabetic lipidosis (HCC)  Mixed hyperlipidemia -     Lipid panel  Essential hypertension, benign -     CBC with Differential/Platelet -     CMP14+EGFR  Hypertension associated with diabetes (HCC) -     Bayer DCA Hb A1c Waived -     Semaglutide,0.25 or 0.5MG /DOS, (OZEMPIC, 0.25 OR 0.5 MG/DOSE,) 2 MG/3ML SOPN; Inject 0.5 mg into the skin once a week.  Strain of muscle of right groin region -     diclofenac (VOLTAREN) 75 MG EC tablet; Take 1 tablet (75 mg total) by mouth 2 (two) times daily. For muscle and  Joint pain   I have discontinued Ebubechukwu Tecson's metFORMIN. I am also having him start on Ozempic (0.25 or 0.5 MG/DOSE) and diclofenac. Additionally, I am having him maintain his Vitamin D, aspirin, pravastatin, lisinopril, amLODipine, Accu-Chek Guide, Accu-Chek Guide, and metoprolol succinate.  Meds ordered this encounter  Medications   Semaglutide,0.25 or 0.5MG /DOS, (OZEMPIC, 0.25 OR 0.5 MG/DOSE,) 2 MG/3ML SOPN    Sig: Inject 0.5 mg into the skin once a week.    Dispense:  3 mL    Refill:  1   diclofenac (VOLTAREN) 75 MG EC tablet    Sig: Take 1 tablet (75 mg total) by mouth 2 (two) times daily. For muscle and  Joint pain    Dispense:  60 tablet    Refill:  2      Follow-up: Return in about 6 weeks (around 11/26/2023) for diabetes, muscle pull, groin.  Mechele Claude, M.D.

## 2023-10-16 LAB — LIPID PANEL
Chol/HDL Ratio: 2.8 ratio (ref 0.0–5.0)
Cholesterol, Total: 109 mg/dL (ref 100–199)
HDL: 39 mg/dL — ABNORMAL LOW (ref >39)
LDL Chol Calc (NIH): 50 mg/dL (ref 0–99)
Triglycerides: 106 mg/dL (ref 0–149)
VLDL Cholesterol Cal: 20 mg/dL (ref 5–40)

## 2023-10-16 LAB — CMP14+EGFR
ALT: 20 IU/L (ref 0–44)
AST: 18 IU/L (ref 0–40)
Albumin: 4.2 g/dL (ref 3.8–4.8)
Alkaline Phosphatase: 162 IU/L — ABNORMAL HIGH (ref 44–121)
BUN/Creatinine Ratio: 21 (ref 10–24)
BUN: 20 mg/dL (ref 8–27)
Bilirubin Total: 0.5 mg/dL (ref 0.0–1.2)
CO2: 23 mmol/L (ref 20–29)
Calcium: 9.1 mg/dL (ref 8.6–10.2)
Chloride: 101 mmol/L (ref 96–106)
Creatinine, Ser: 0.96 mg/dL (ref 0.76–1.27)
Globulin, Total: 2.8 g/dL (ref 1.5–4.5)
Glucose: 172 mg/dL — ABNORMAL HIGH (ref 70–99)
Potassium: 4.1 mmol/L (ref 3.5–5.2)
Sodium: 139 mmol/L (ref 134–144)
Total Protein: 7 g/dL (ref 6.0–8.5)
eGFR: 83 mL/min/{1.73_m2} (ref >59)

## 2023-10-16 LAB — CBC WITH DIFFERENTIAL/PLATELET
Basophils Absolute: 0.1 10*3/uL (ref 0.0–0.2)
Basos: 1 %
EOS (ABSOLUTE): 0.1 10*3/uL (ref 0.0–0.4)
Eos: 2 %
Hematocrit: 46.8 % (ref 37.5–51.0)
Hemoglobin: 15.4 g/dL (ref 13.0–17.7)
Immature Grans (Abs): 0 10*3/uL (ref 0.0–0.1)
Immature Granulocytes: 0 %
Lymphocytes Absolute: 1.7 10*3/uL (ref 0.7–3.1)
Lymphs: 30 %
MCH: 30.4 pg (ref 26.6–33.0)
MCHC: 32.9 g/dL (ref 31.5–35.7)
MCV: 92 fL (ref 79–97)
Monocytes Absolute: 0.4 10*3/uL (ref 0.1–0.9)
Monocytes: 8 %
Neutrophils Absolute: 3.3 10*3/uL (ref 1.4–7.0)
Neutrophils: 59 %
Platelets: 197 10*3/uL (ref 150–450)
RBC: 5.07 x10E6/uL (ref 4.14–5.80)
RDW: 13.1 % (ref 11.6–15.4)
WBC: 5.6 10*3/uL (ref 3.4–10.8)

## 2023-10-16 NOTE — Progress Notes (Addendum)
Hello [name],  Your lab result is normal and/or stable.Some minor variations that are not significant are commonly marked abnormal, but do not represent any medical problem for you.  Best regards, Mechele Claude, M.D.

## 2023-11-26 ENCOUNTER — Encounter (HOSPITAL_BASED_OUTPATIENT_CLINIC_OR_DEPARTMENT_OTHER): Payer: Self-pay | Admitting: Emergency Medicine

## 2023-11-26 ENCOUNTER — Other Ambulatory Visit: Payer: Self-pay

## 2023-11-26 ENCOUNTER — Ambulatory Visit: Payer: Medicare Other | Admitting: Family Medicine

## 2023-11-26 ENCOUNTER — Emergency Department (HOSPITAL_BASED_OUTPATIENT_CLINIC_OR_DEPARTMENT_OTHER): Payer: Medicare Other

## 2023-11-26 ENCOUNTER — Emergency Department (HOSPITAL_BASED_OUTPATIENT_CLINIC_OR_DEPARTMENT_OTHER)
Admission: EM | Admit: 2023-11-26 | Discharge: 2023-11-26 | Disposition: A | Discharge status: Home / Self Care | Payer: Medicare Other | Attending: Emergency Medicine | Admitting: Emergency Medicine

## 2023-11-26 DIAGNOSIS — Z7982 Long term (current) use of aspirin: Secondary | ICD-10-CM | POA: Insufficient documentation

## 2023-11-26 DIAGNOSIS — N3289 Other specified disorders of bladder: Secondary | ICD-10-CM | POA: Diagnosis not present

## 2023-11-26 DIAGNOSIS — Z79899 Other long term (current) drug therapy: Secondary | ICD-10-CM | POA: Diagnosis not present

## 2023-11-26 DIAGNOSIS — I10 Essential (primary) hypertension: Secondary | ICD-10-CM | POA: Diagnosis not present

## 2023-11-26 DIAGNOSIS — Z794 Long term (current) use of insulin: Secondary | ICD-10-CM | POA: Diagnosis not present

## 2023-11-26 DIAGNOSIS — K5900 Constipation, unspecified: Secondary | ICD-10-CM | POA: Diagnosis not present

## 2023-11-26 DIAGNOSIS — E119 Type 2 diabetes mellitus without complications: Secondary | ICD-10-CM | POA: Diagnosis not present

## 2023-11-26 DIAGNOSIS — R109 Unspecified abdominal pain: Secondary | ICD-10-CM | POA: Diagnosis not present

## 2023-11-26 DIAGNOSIS — N281 Cyst of kidney, acquired: Secondary | ICD-10-CM | POA: Diagnosis not present

## 2023-11-26 DIAGNOSIS — N200 Calculus of kidney: Secondary | ICD-10-CM | POA: Insufficient documentation

## 2023-11-26 DIAGNOSIS — K5909 Other constipation: Secondary | ICD-10-CM

## 2023-11-26 LAB — URINALYSIS, ROUTINE W REFLEX MICROSCOPIC
Bilirubin Urine: NEGATIVE
Glucose, UA: NEGATIVE mg/dL
Hgb urine dipstick: NEGATIVE
Ketones, ur: 15 mg/dL — AB
Leukocytes,Ua: NEGATIVE
Nitrite: NEGATIVE
Specific Gravity, Urine: 1.018 (ref 1.005–1.030)
pH: 5.5 (ref 5.0–8.0)

## 2023-11-26 LAB — COMPREHENSIVE METABOLIC PANEL
ALT: 15 U/L (ref 0–44)
AST: 11 U/L — ABNORMAL LOW (ref 15–41)
Albumin: 4.7 g/dL (ref 3.5–5.0)
Alkaline Phosphatase: 114 U/L (ref 38–126)
Anion gap: 10 (ref 5–15)
BUN: 18 mg/dL (ref 8–23)
CO2: 28 mmol/L (ref 22–32)
Calcium: 10.2 mg/dL (ref 8.9–10.3)
Chloride: 101 mmol/L (ref 98–111)
Creatinine, Ser: 1.04 mg/dL (ref 0.61–1.24)
GFR, Estimated: >60 mL/min (ref >60)
Glucose, Bld: 155 mg/dL — ABNORMAL HIGH (ref 70–99)
Potassium: 3.9 mmol/L (ref 3.5–5.1)
Sodium: 139 mmol/L (ref 135–145)
Total Bilirubin: 1.3 mg/dL — ABNORMAL HIGH (ref 0.0–1.2)
Total Protein: 7.9 g/dL (ref 6.5–8.1)

## 2023-11-26 LAB — CBC WITH DIFFERENTIAL/PLATELET
Abs Immature Granulocytes: 0.02 10*3/uL (ref 0.00–0.07)
Basophils Absolute: 0 10*3/uL (ref 0.0–0.1)
Basophils Relative: 1 %
Eosinophils Absolute: 0 10*3/uL (ref 0.0–0.5)
Eosinophils Relative: 1 %
HCT: 49.9 % (ref 39.0–52.0)
Hemoglobin: 17.4 g/dL — ABNORMAL HIGH (ref 13.0–17.0)
Immature Granulocytes: 0 %
Lymphocytes Relative: 18 %
Lymphs Abs: 1.5 10*3/uL (ref 0.7–4.0)
MCH: 31.1 pg (ref 26.0–34.0)
MCHC: 34.9 g/dL (ref 30.0–36.0)
MCV: 89.1 fL (ref 80.0–100.0)
Monocytes Absolute: 0.6 10*3/uL (ref 0.1–1.0)
Monocytes Relative: 7 %
Neutro Abs: 6.1 10*3/uL (ref 1.7–7.7)
Neutrophils Relative %: 73 %
Platelets: 221 10*3/uL (ref 150–400)
RBC: 5.6 MIL/uL (ref 4.22–5.81)
RDW: 13.2 % (ref 11.5–15.5)
WBC: 8.2 10*3/uL (ref 4.0–10.5)
nRBC: 0 % (ref 0.0–0.2)

## 2023-11-26 LAB — LIPASE, BLOOD: Lipase: 24 U/L (ref 11–51)

## 2023-11-26 MED ORDER — ONDANSETRON HCL 4 MG/2ML IJ SOLN
4.0000 mg | Freq: Once | INTRAMUSCULAR | Status: AC
  Administered 2023-11-26: 4 mg via INTRAVENOUS
  Filled 2023-11-26: qty 2

## 2023-11-26 MED ORDER — MORPHINE SULFATE (PF) 4 MG/ML IV SOLN
4.0000 mg | Freq: Once | INTRAVENOUS | Status: AC
  Administered 2023-11-26: 4 mg via INTRAVENOUS
  Filled 2023-11-26: qty 1

## 2023-11-26 MED ORDER — SODIUM CHLORIDE 0.9 % IV BOLUS
1000.0000 mL | Freq: Once | INTRAVENOUS | Status: AC
  Administered 2023-11-26: 1000 mL via INTRAVENOUS

## 2023-11-26 MED ORDER — IOHEXOL 300 MG/ML  SOLN
100.0000 mL | Freq: Once | INTRAMUSCULAR | Status: AC | PRN
  Administered 2023-11-26: 100 mL via INTRAVENOUS

## 2023-11-26 NOTE — ED Notes (Signed)
 Patient is still unable to obtain a urine sample at this time.

## 2023-11-26 NOTE — Discharge Instructions (Addendum)
 It was a pleasure caring for you today. Please follow up with your primary care provider. Seek emergency care if experiencing any new or worsening symptoms.

## 2023-11-26 NOTE — ED Provider Notes (Signed)
   Accepted handoff at shift change from [facility]. Please see prior provider note for more detail.   Briefly: Patient is 73 y.o. "presenting to the ED for evaluation of abdominal pain.  He is wondering if this is either a kidney stone or a another bowel obstruction.  He states his last normal bowel movement was 4 days ago.  He did have a small hard bowel movement this morning.  He reports that the pain was generalized, worse in the lower abdomen and started yesterday.  It worsened just prior to arrival.  The pain does radiate to the right flank.  No nausea.  He states he is unable to vomit due to his hernia repair surgery.  He denies any fevers or chills.  He states the pain did cause him to sweat profusely.  He denies urinary complaints including dysuria, frequency, urgency, hematuria.  No alcohol use.  No recreational drug use.  No smoking.  He was able to pass large amounts of gas just prior to arrival.  He denies chest pain or shortness of breath."  DDX: concern for AAA, gastroenteritis, appendicitis, Bowel obstruction, Bowel perforation. Gastroparesis, DKA, Hernia, Inflammatory bowel disease, mesenteric ischemia, pancreatitis, peritonitis SBP, volvulus.   Plan:  -dispo pending CT imaging and UA -CMP reassuring.  CBC without leukocytosis or anemia.  Lipase within normal limits.  UA not concerning for infection. -CT abdomen/pelvis showing bladder calculus without complicating factors. -Shared all results with patient.  Answered questions.  Patient wondering if his pain could be due to constipation.  Patient did have a BM within the past 48 hours.  Educated patient on dosing of MiraLAX.  Recommended following up with PCP.  Patient verbalized understanding of plan. -Patient afebrile with stable vitals.  Provided with return precautions.  Discharged in good condition.     Dorthy Cooler, [place] 11/26/23 2057    Franne Forts, DO 11/27/23 2017

## 2023-11-26 NOTE — ED Provider Notes (Signed)
 [place] EMERGENCY DEPARTMENT AT [facility] Provider Note   CSN: [id number] Arrival date & time: 11/26/23  1647     History  Chief Complaint  Patient presents with   Abdominal Pain    [name] is a 73 y.o. male.  With history of hypertension, previous small bowel obstructions, gastric fundoplication, NIDDM 2, BPPV, hyperlipidemia presenting to the ED for evaluation of abdominal pain.  He is wondering if this is either a kidney stone or a another bowel obstruction.  He states his last normal bowel movement was 4 days ago.  He did have a small hard bowel movement this morning.  He reports that the pain was generalized, worse in the lower abdomen and started yesterday.  It worsened just prior to arrival.  The pain does radiate to the right flank.  No nausea.  He states he is unable to vomit due to his hernia repair surgery.  He denies any fevers or chills.  He states the pain did cause him to sweat profusely.  He denies urinary complaints including dysuria, frequency, urgency, hematuria.  No alcohol use.  No recreational drug use.  No smoking.  He was able to pass large amounts of gas just prior to arrival.  He denies chest pain or shortness of breath.   Abdominal Pain      Home Medications Prior to Admission medications   Medication Sig Start Date End Date Taking? Authorizing Provider  amLODipine (NORVASC) 10 MG tablet Take 1 tablet (10 mg total) by mouth daily. 06/19/23   Mechele Claude, MD  aspirin 81 MG tablet Take 81 mg by mouth daily.    [provider]  Blood Glucose Monitoring Suppl (ACCU-CHEK GUIDE) w/Device KIT Check BS up to 4 times a day Dx E11.8 06/19/23   Mechele Claude, MD  Cholecalciferol (VITAMIN D) 2000 UNITS CAPS Take 2 capsules by mouth daily.    [provider]  diclofenac (VOLTAREN) 75 MG EC tablet Take 1 tablet (75 mg total) by mouth 2 (two) times daily. For muscle and  Joint pain 10/15/23   Mechele Claude, MD  glucose blood (ACCU-CHEK  GUIDE) test strip Check BS up to 4 times a day Dx E11.8 06/19/23   Mechele Claude, MD  lisinopril (ZESTRIL) 30 MG tablet Take 1 tablet (30 mg total) by mouth daily. 12/19/22   Mechele Claude, MD  metoprolol succinate (TOPROL-XL) 100 MG 24 hr tablet TAKE ONE TABLET DAILY WITH MEAL(S) 10/10/23   Mechele Claude, MD  pravastatin (PRAVACHOL) 20 MG tablet Take 1 tablet (20 mg total) by mouth daily. 12/19/22   Mechele Claude, MD  Semaglutide,0.25 or 0.5MG /DOS, (OZEMPIC, 0.25 OR 0.5 MG/DOSE,) 2 MG/3ML SOPN Inject 0.5 mg into the skin once a week. 10/15/23   Mechele Claude, MD      Allergies    Penicillins    Review of Systems   Review of Systems  Gastrointestinal:  Positive for abdominal pain.  All other systems reviewed and are negative.   Physical Exam Updated Vital Signs BP 136/83   Pulse 79   Temp 98.4 F (36.9 C) (Oral)   Resp 16   Ht 6' (1.829 m)   Wt 96.2 kg   SpO2 94%   BMI 28.75 kg/m  Physical Exam Vitals and nursing note reviewed.  Constitutional:      General: He is not in acute distress.    Appearance: Normal appearance. He is normal weight. He is not ill-appearing.     Comments: Resting comfortably in bed  HENT:     Head: Normocephalic and atraumatic.  Pulmonary:     Effort: Pulmonary effort is normal. No respiratory distress.  Abdominal:     General: Abdomen is flat. Bowel sounds are decreased.     Palpations: Abdomen is soft.     Tenderness: There is generalized abdominal tenderness. There is no right CVA tenderness, left CVA tenderness or guarding.  Musculoskeletal:        General: Normal range of motion.     Cervical back: Neck supple.  Skin:    General: Skin is warm and dry.  Neurological:     Mental Status: He is alert and oriented to person, place, and time.  Psychiatric:        Mood and Affect: Mood normal.        Behavior: Behavior normal.     ED Results / Procedures / Treatments   Labs (all labs ordered are listed, but only abnormal results are  displayed) Labs Reviewed  CBC WITH DIFFERENTIAL/PLATELET - Abnormal; Notable for the following components:      Result Value   Hemoglobin 17.4 (*)    All other components within normal limits  COMPREHENSIVE METABOLIC PANEL - Abnormal; Notable for the following components:   Glucose, Bld 155 (*)    AST 11 (*)    Total Bilirubin 1.3 (*)    All other components within normal limits  LIPASE, BLOOD  URINALYSIS, ROUTINE W REFLEX MICROSCOPIC    EKG None  Radiology No results found.  Procedures Procedures    Medications Ordered in ED Medications  sodium chloride 0.9 % bolus 1,000 mL (has no administration in time range)  morphine (PF) 4 MG/ML injection 4 mg (4 mg Intravenous Given 11/26/23 1751)  ondansetron (ZOFRAN) injection 4 mg (4 mg Intravenous Given 11/26/23 1749)  iohexol (OMNIPAQUE) 300 MG/ML solution 100 mL (100 mLs Intravenous Contrast Given 11/26/23 1839)    ED Course/ Medical Decision Making/ A&P                                 Medical Decision Making This patient presents to the ED for concern of lower abdominal pain, this involves an extensive number of treatment options, and is a complaint that carries with it a high risk of complications and morbidity.  The differential diagnosis for generalized abdominal pain includes, but is not limited to AAA, gastroenteritis, appendicitis, Bowel obstruction, Bowel perforation. Gastroparesis, DKA, Hernia, Inflammatory bowel disease, mesenteric ischemia, pancreatitis, peritonitis SBP, volvulus.   My initial workup includes labs, imaging, symptom control  Additional history obtained from: Nursing notes from this visit. ED visit for similar on 05/24/2022  I ordered, reviewed and interpreted labs which include: CBC, CMP, lipase, urinalysis.  No leukocytosis.  He hemoglobin elevated to 17.4.  Hyperglycemia 155 with a normal anion gap.  Total bilirubin mildly elevated to 1.3.  No kidney dysfunction or electrolyte derangement.  Lipase  normal.  I ordered imaging studies including CT abdomen pelvis I independently visualized and interpreted imaging which showed pending at shift change  73 year old male presenting to the ED for evaluation of abdominal pain.  States his symptoms began yesterday, acutely worsened just prior to arrival.  Pain is across the lower abdomen but radiates to the right flank.  Has a history of kidney stones and feels like this may be a kidney stone.  Also has a history of small bowel obstruction and stated this feels similar to when  he has had an obstruction in the past.  He reports some nausea but no vomiting.  He is able to pass gas today.  He appears well on physical exam.  He was apparently diaphoretic while he was checking in.  This is since resolved.  He denies any chest pain or shortness of breath.  Lab workup was overall reassuring.  CT abdomen pelvis pending at shift change.  Plan at time to changes to reassess after CT resulted.  Disposition is pending results of CT.  Care handed off to oncoming provider.  Please see their note for final disposition and decision making.  Plan may change at the discretion of the oncoming provider.  Patient's case discussed with Dr. Wallace Cullens.  Note: Portions of this report may have been transcribed using voice recognition software. Every effort was made to ensure accuracy; however, inadvertent computerized transcription errors may still be present.        Final Clinical Impression(s) / ED Diagnoses Final diagnoses:  None    Rx / DC Orders ED Discharge Orders     None         Michelle Piper, PA-C 11/26/23 1850    Franne Forts, DO 11/27/23 2009

## 2023-11-26 NOTE — ED Triage Notes (Signed)
 States has nt pooped well since last sat  now having gas having all over abd pain

## 2023-11-26 NOTE — ED Notes (Signed)
 AVS provided by edp was reviewed with pt. Pt verbalized understanding with no additional questions at this time. Pt provided with urine strainer for home use.

## 2023-12-01 ENCOUNTER — Telehealth: Payer: Self-pay | Admitting: Family Medicine

## 2023-12-01 NOTE — Telephone Encounter (Signed)
 Copied from CRM [id number]. Topic: Clinical - Medication Question >> Dec 01, 2023  9:37 AM Franchot Heidelberg wrote: Reason for CRM: Pt has questions for clinic regarding rescheduling his follow up appt on new medicine that he is trying.   Best contact: [phone]

## 2023-12-01 NOTE — Telephone Encounter (Signed)
 Addressed in another encounter. LS

## 2023-12-06 ENCOUNTER — Other Ambulatory Visit: Payer: Self-pay | Admitting: Family Medicine

## 2023-12-06 DIAGNOSIS — I10 Essential (primary) hypertension: Secondary | ICD-10-CM

## 2023-12-06 DIAGNOSIS — E118 Type 2 diabetes mellitus with unspecified complications: Secondary | ICD-10-CM

## 2023-12-19 ENCOUNTER — Other Ambulatory Visit: Payer: Self-pay | Admitting: Family Medicine

## 2023-12-19 DIAGNOSIS — I152 Hypertension secondary to endocrine disorders: Secondary | ICD-10-CM

## 2023-12-24 ENCOUNTER — Other Ambulatory Visit: Payer: Self-pay | Admitting: Family Medicine

## 2023-12-24 DIAGNOSIS — E118 Type 2 diabetes mellitus with unspecified complications: Secondary | ICD-10-CM

## 2023-12-24 DIAGNOSIS — E782 Mixed hyperlipidemia: Secondary | ICD-10-CM

## 2023-12-31 ENCOUNTER — Ambulatory Visit: Admitting: Family Medicine

## 2024-01-01 ENCOUNTER — Encounter: Payer: Self-pay | Admitting: Family Medicine

## 2024-01-01 ENCOUNTER — Ambulatory Visit: Admitting: Family Medicine

## 2024-01-01 DIAGNOSIS — I10 Essential (primary) hypertension: Secondary | ICD-10-CM

## 2024-01-01 DIAGNOSIS — E118 Type 2 diabetes mellitus with unspecified complications: Secondary | ICD-10-CM | POA: Diagnosis not present

## 2024-01-01 DIAGNOSIS — E782 Mixed hyperlipidemia: Secondary | ICD-10-CM | POA: Diagnosis not present

## 2024-01-01 DIAGNOSIS — Z7985 Long-term (current) use of injectable non-insulin antidiabetic drugs: Secondary | ICD-10-CM

## 2024-01-01 MED ORDER — PRAVASTATIN SODIUM 20 MG PO TABS: 20.0000 mg | ORAL_TABLET | Freq: Every day | ORAL | 0 refills | Status: DC

## 2024-01-01 MED ORDER — LISINOPRIL 30 MG PO TABS: 30.0000 mg | ORAL_TABLET | Freq: Every day | ORAL | 0 refills | Status: DC

## 2024-01-01 MED ORDER — METOPROLOL SUCCINATE ER 100 MG PO TB24: ORAL_TABLET | ORAL | 0 refills | Status: DC

## 2024-01-01 NOTE — Progress Notes (Signed)
 Subjective:  Patient ID: [name],  male    DOB: 02-20-1951  Age: 73 y.o.    CC: Medical Management of Chronic Issues   HPI [name] presents for  follow-up of hypertension. Patient has no history of headache chest pain or shortness of breath or recent cough. Patient also denies symptoms of TIA such as numbness weakness lateralizing. Patient denies side effects from medication. States taking it regularly.  Patient also  in for follow-up of elevated cholesterol. Doing well without complaints on current medication. Denies side effects  including myalgia and arthralgia and nausea. Also in today for liver function testing. Currently no chest pain, shortness of breath or other cardiovascular related symptoms noted.  Follow-up of diabetes. Patient does check blood sugar at home. Readings run between 120-130.  Patient denies symptoms such as excessive hunger or urinary frequency, excessive hunger, nausea No significant hypoglycemic spells noted. Medications reviewed. Pt reports taking them regularly. Pt. denies complication/adverse reaction today.    History [name] has a past medical history of Arthritis, Atypical small acinar proliferation of prostate, Benign paroxysmal positional vertigo, Diabetes mellitus without complication (HCC), Elevated PSA, Essential hypertension, benign, GERD (gastroesophageal reflux disease), History of basal cell carcinoma excision, History of hiatal hernia, History of kidney stones, History of melanoma excision, History of small bowel obstruction, Hyperlipidemia, LAFB (left anterior fascicular block), and Prediabetes.   He has a past surgical history that includes Lumbar disc surgery (x3   last one 1980's); Gastric fundoplication (1970's); and Prostate biopsy (N/A, 11/21/2015).   His family history includes Diabetes in his father; Hypertension in his father and mother; Melanoma in his mother.He reports that he has never smoked. He has never used smokeless  tobacco. He reports current alcohol use. He reports that he does not use drugs.  Current Outpatient Medications on File Prior to Visit  Medication Sig Dispense Refill   amLODipine (NORVASC) 10 MG tablet Take 1 tablet (10 mg total) by mouth daily. 90 tablet 3   aspirin 81 MG tablet Take 81 mg by mouth daily.     Blood Glucose Monitoring Suppl (ACCU-CHEK GUIDE) w/Device KIT Check BS up to 4 times a day Dx E11.8 1 kit 0   Cholecalciferol (VITAMIN D) 2000 UNITS CAPS Take 2 capsules by mouth daily.     diclofenac (VOLTAREN) 75 MG EC tablet Take 1 tablet (75 mg total) by mouth 2 (two) times daily. For muscle and  Joint pain 60 tablet 2   glucose blood (ACCU-CHEK GUIDE) test strip Check BS up to 4 times a day Dx E11.8 400 strip 3   Semaglutide,0.25 or 0.5MG /DOS, (OZEMPIC, 0.25 OR 0.5 MG/DOSE,) 2 MG/3ML SOPN INJECT 0.5MG  ONCE WEEKLY 3 mL 0   No current facility-administered medications on file prior to visit.    ROS Review of Systems  Constitutional:  Negative for fever.  Respiratory:  Negative for shortness of breath.   Cardiovascular:  Negative for chest pain.  Musculoskeletal:  Negative for arthralgias.  Skin:  Negative for rash.    Objective:  BP 137/77   Pulse 67   Temp 98.2 F (36.8 C)   Ht 6' (1.829 m)   Wt 216 lb (98 kg)   SpO2 96%   BMI 29.29 kg/m   BP Readings from Last 3 Encounters:  01/01/24 137/77  11/26/23 (!) 142/92  10/15/23 131/75    Wt Readings from Last 3 Encounters:  01/01/24 216 lb (98 kg)  11/26/23 212 lb (96.2 kg)  10/15/23 225 lb (102.1 kg)  Lab Results  Component Value Date   HGBA1C 7.2 (H) 10/15/2023   HGBA1C 7.0 (H) 06/19/2023   HGBA1C 6.6 (H) 12/19/2022    Physical Exam Vitals reviewed.  Constitutional:      Appearance: He is well-developed.  HENT:     Head: Normocephalic and atraumatic.     Right Ear: External ear normal.     Left Ear: External ear normal.     Mouth/Throat:     Pharynx: No oropharyngeal exudate or posterior  oropharyngeal erythema.  Eyes:     Pupils: Pupils are equal, round, and reactive to light.  Cardiovascular:     Rate and Rhythm: Normal rate and regular rhythm.     Heart sounds: No murmur heard. Pulmonary:     Effort: No respiratory distress.     Breath sounds: Normal breath sounds.  Musculoskeletal:     Cervical back: Normal range of motion and neck supple.  Neurological:     Mental Status: He is alert and oriented to person, place, and time.      Diabetic foot exam was performed with the following findings:   No deformities, ulcerations, or other skin breakdown Intact posterior tibialis and dorsalis pedis pulses Sesation nml except not present at left big toe      Assessment & Plan:  Hypertension -     Metoprolol Succinate ER; TAKE ONE TABLET DAILY WITH MEAL(S)  Dispense: 90 tablet; Refill: 0 -     Lisinopril; Take 1 tablet (30 mg total) by mouth daily.  Dispense: 90 tablet; Refill: 0  Controlled type 2 diabetes mellitus with complication, without long-term current use of insulin (HCC) -     Lisinopril; Take 1 tablet (30 mg total) by mouth daily.  Dispense: 90 tablet; Refill: 0 -     Pravastatin Sodium; Take 1 tablet (20 mg total) by mouth daily.  Dispense: 90 tablet; Refill: 0  Mixed hyperlipidemia -     Pravastatin Sodium; Take 1 tablet (20 mg total) by mouth daily.  Dispense: 90 tablet; Refill: 0    Follow-up: Return in about 3 months (around 04/01/2024).  Mechele Claude, M.D.

## 2024-01-01 NOTE — Patient Instructions (Signed)

## 2024-01-09 ENCOUNTER — Other Ambulatory Visit: Payer: Self-pay | Admitting: Family Medicine

## 2024-01-09 DIAGNOSIS — E1159 Type 2 diabetes mellitus with other circulatory complications: Secondary | ICD-10-CM

## 2024-01-09 DIAGNOSIS — I10 Essential (primary) hypertension: Secondary | ICD-10-CM

## 2024-04-27 DIAGNOSIS — Z85820 Personal history of malignant melanoma of skin: Secondary | ICD-10-CM | POA: Diagnosis not present

## 2024-04-27 DIAGNOSIS — D225 Melanocytic nevi of trunk: Secondary | ICD-10-CM | POA: Diagnosis not present

## 2024-04-27 DIAGNOSIS — C44219 Basal cell carcinoma of skin of left ear and external auricular canal: Secondary | ICD-10-CM | POA: Diagnosis not present

## 2024-04-27 DIAGNOSIS — C44319 Basal cell carcinoma of skin of other parts of face: Secondary | ICD-10-CM | POA: Diagnosis not present

## 2024-04-27 DIAGNOSIS — Z1283 Encounter for screening for malignant neoplasm of skin: Secondary | ICD-10-CM | POA: Diagnosis not present

## 2024-04-27 DIAGNOSIS — Z08 Encounter for follow-up examination after completed treatment for malignant neoplasm: Secondary | ICD-10-CM | POA: Diagnosis not present

## 2024-05-03 ENCOUNTER — Ambulatory Visit: Admitting: Family Medicine

## 2024-05-05 ENCOUNTER — Ambulatory Visit: Admitting: Family Medicine

## 2024-05-20 ENCOUNTER — Ambulatory Visit (INDEPENDENT_AMBULATORY_CARE_PROVIDER_SITE_OTHER): Admitting: Family Medicine

## 2024-05-20 VITALS — BP 135/75 | HR 70 | Temp 97.3°F | Ht 72.0 in | Wt 216.6 lb

## 2024-05-20 DIAGNOSIS — I10 Essential (primary) hypertension: Secondary | ICD-10-CM | POA: Diagnosis not present

## 2024-05-20 DIAGNOSIS — E118 Type 2 diabetes mellitus with unspecified complications: Secondary | ICD-10-CM | POA: Diagnosis not present

## 2024-05-20 DIAGNOSIS — E782 Mixed hyperlipidemia: Secondary | ICD-10-CM

## 2024-05-20 LAB — LIPID PANEL

## 2024-05-20 LAB — BAYER DCA HB A1C WAIVED: HB A1C (BAYER DCA - WAIVED): 6.7 % — ABNORMAL HIGH (ref 4.8–5.6)

## 2024-05-20 MED ORDER — METOPROLOL SUCCINATE ER 100 MG PO TB24: ORAL_TABLET | ORAL | 3 refills | Status: AC

## 2024-05-20 MED ORDER — PRAVASTATIN SODIUM 20 MG PO TABS
20.0000 mg | ORAL_TABLET | Freq: Every day | ORAL | 3 refills | Status: AC
Start: 2024-05-20 — End: ?

## 2024-05-20 MED ORDER — AMLODIPINE BESYLATE 10 MG PO TABS: 10.0000 mg | ORAL_TABLET | Freq: Every day | ORAL | 3 refills | Status: AC

## 2024-05-20 MED ORDER — LISINOPRIL 30 MG PO TABS: 30.0000 mg | ORAL_TABLET | Freq: Every day | ORAL | 3 refills | Status: AC

## 2024-05-20 NOTE — Progress Notes (Signed)
 Subjective:  Patient ID: [name], male    DOB: 1951/02/17  Age: 73 y.o. MRN: [id number]  CC: Medical Management of Chronic Issues   HPI  Discussed the use of AI scribe software for clinical note transcription with the patient, who gave verbal consent to proceed.  History of Present Illness    uses semaglutide  weekly for diabetes management but has not experienced weight loss, maintaining a weight of 216 pounds, which is the lowest he has been in the last 15-20 years. His blood sugar levels have been stable, With log showing range between 110-130 fasting and 110-150 mg/dL postprandial, and his J8r is 6.7%.  He experiences occasional swelling in his legs, which occurs sporadically and is not associated with any specific activity.  His blood pressure readings at home are generally in the 120s/70s, with occasional readings in the 130s. He monitors his blood pressure and blood sugar regularly. Log sheets returned, reviewed. No low readings noted. Off diet some while on vacation at [facility].   in for follow-up of elevated cholesterol. Doing well without complaints on current medication. Denies side effects of statin including myalgia and arthralgia and nausea. Currently no chest pain, shortness of breath or other cardiovascular related symptoms noted.        05/20/2024    1:13 PM 05/20/2024   12:58 PM 10/15/2023    9:08 AM  Depression screen PHQ 2/9  Decreased Interest 0 0 0  Down, Depressed, Hopeless 0 0 0  PHQ - 2 Score 0 0 0  Altered sleeping 0  0  Tired, decreased energy 0  0  Change in appetite 0  0  Feeling bad or failure about yourself  0  0  Trouble concentrating 0  0  Moving slowly or fidgety/restless 0  0  Suicidal thoughts 0  0  PHQ-9 Score 0  0  Difficult doing work/chores Not difficult at all  Not difficult at all    History Kaylob has a past medical history of Arthritis, Atypical small acinar proliferation of prostate, Benign paroxysmal positional vertigo,  Diabetes mellitus without complication (HCC), Elevated PSA, Essential hypertension, benign, GERD (gastroesophageal reflux disease), History of basal cell carcinoma excision, History of hiatal hernia, History of kidney stones, History of melanoma excision, History of small bowel obstruction, Hyperlipidemia, LAFB (left anterior fascicular block), and Prediabetes.   He has a past surgical history that includes Lumbar disc surgery (x3   last one 1980's); Gastric fundoplication (1970's); and Prostate biopsy (N/A, 11/21/2015).   His family history includes Diabetes in his father; Hypertension in his father and mother; Melanoma in his mother.He reports that he has never smoked. He has never used smokeless tobacco. He reports current alcohol use. He reports that he does not use drugs.    ROS Review of Systems  Constitutional:  Negative for fever.  Respiratory:  Negative for shortness of breath.   Cardiovascular:  Negative for chest pain.  Musculoskeletal:  Negative for arthralgias.  Skin:  Negative for rash.    Objective:  BP 135/75   Pulse 70   Temp (!) 97.3 F (36.3 C)   Ht 6' (1.829 m)   Wt 216 lb 9.6 oz (98.2 kg)   SpO2 98%   BMI 29.38 kg/m   BP Readings from Last 3 Encounters:  05/20/24 135/75  01/01/24 137/77  11/26/23 (!) 142/92    Wt Readings from Last 3 Encounters:  05/20/24 216 lb 9.6 oz (98.2 kg)  01/01/24 216 lb (98 kg)  11/26/23  212 lb (96.2 kg)     Physical Exam Constitutional:      General: He is not in acute distress.    Appearance: He is well-developed.  HENT:     Head: Normocephalic and atraumatic.     Right Ear: External ear normal.     Left Ear: External ear normal.     Nose: Nose normal.  Eyes:     Conjunctiva/sclera: Conjunctivae normal.     Pupils: Pupils are equal, round, and reactive to light.  Cardiovascular:     Rate and Rhythm: Normal rate and regular rhythm.     Heart sounds: Normal heart sounds. No murmur heard. Pulmonary:     Effort:  Pulmonary effort is normal. No respiratory distress.     Breath sounds: Normal breath sounds. No wheezing or rales.  Abdominal:     Palpations: Abdomen is soft.     Tenderness: There is no abdominal tenderness.  Musculoskeletal:        General: Normal range of motion.     Cervical back: Normal range of motion and neck supple.  Skin:    General: Skin is warm and dry.  Neurological:     Mental Status: He is alert and oriented to person, place, and time.     Deep Tendon Reflexes: Reflexes are normal and symmetric.  Psychiatric:        Behavior: Behavior normal.        Thought Content: Thought content normal.        Judgment: Judgment normal.      Assessment & Plan:  Essential hypertension, benign -     CMP14+EGFR -     amLODIPine  Besylate; Take 1 tablet (10 mg total) by mouth daily.  Dispense: 90 tablet; Refill: 3 -     Lisinopril ; Take 1 tablet (30 mg total) by mouth daily.  Dispense: 90 tablet; Refill: 3 -     Metoprolol  Succinate ER; TAKE ONE TABLET DAILY WITH MEAL(S)  Dispense: 90 tablet; Refill: 3  Controlled type 2 diabetes mellitus with complication, without long-term current use of insulin (HCC) -     Bayer DCA Hb A1c Waived -     CBC with Differential/Platelet -     CMP14+EGFR -     Microalbumin / creatinine urine ratio -     Lisinopril ; Take 1 tablet (30 mg total) by mouth daily.  Dispense: 90 tablet; Refill: 3 -     Pravastatin  Sodium; Take 1 tablet (20 mg total) by mouth daily.  Dispense: 90 tablet; Refill: 3  Mixed hyperlipidemia -     Lipid panel -     Pravastatin  Sodium; Take 1 tablet (20 mg total) by mouth daily.  Dispense: 90 tablet; Refill: 3  Hypertension -     CMP14+EGFR -     amLODIPine  Besylate; Take 1 tablet (10 mg total) by mouth daily.  Dispense: 90 tablet; Refill: 3 -     Lisinopril ; Take 1 tablet (30 mg total) by mouth daily.  Dispense: 90 tablet; Refill: 3 -     Metoprolol  Succinate ER; TAKE ONE TABLET DAILY WITH MEAL(S)  Dispense: 90 tablet;  Refill: 3    Assessment and Plan Assessment & Plan   Type 2 diabetes mellitus with good control. A1c is 6.7%. Blood sugar readings mostly between 108 and 140 mg/dL. Continues on semaglutide  with some nausea. - Continue semaglutide  at current dose. - Monitor blood sugar once daily, alternating between fasting and postprandial readings. - Follow up in 3-4 months for  reassessment.  Overweight Overweight at 216 lbs, height 6 feet. Goal to lose approximately 15 lbs. - Encourage weight loss through lifestyle modifications, aiming for a 15 lb reduction.  Benign lower extremity edema Intermittent benign lower extremity edema, likely due to circulation issues, age, and prolonged standing. No current swelling observed. - Advise reducing salt intake when swelling is observed.  Essential hypertension Essential hypertension well-controlled with home readings mostly in the 120s/70s. - Monitor blood pressure 2-3 times per week.     Follow-up: Return in about 3 months (around 08/20/2024) for diabetes.  Butler Der, M.D.

## 2024-05-21 LAB — MICROALBUMIN / CREATININE URINE RATIO
Creatinine, Urine: 90.5 mg/dL
Microalb/Creat Ratio: 31 mg/g{creat} — ABNORMAL HIGH (ref 0–29)
Microalbumin, Urine: 28.1 ug/mL

## 2024-05-21 LAB — LIPID PANEL
Cholesterol, Total: 114 mg/dL (ref 100–199)
HDL: 39 mg/dL — AB (ref >39)
LDL CALC COMMENT:: 2.9 ratio (ref 0.0–5.0)
LDL Chol Calc (NIH): 57 mg/dL (ref 0–99)
Triglycerides: 92 mg/dL (ref 0–149)
VLDL Cholesterol Cal: 18 mg/dL (ref 5–40)

## 2024-05-21 LAB — CMP14+EGFR
ALT: 16 IU/L (ref 0–44)
AST: 17 IU/L (ref 0–40)
Albumin: 4.2 g/dL (ref 3.8–4.8)
Alkaline Phosphatase: 135 IU/L — AB (ref 44–121)
BUN/Creatinine Ratio: 20 (ref 10–24)
BUN: 20 mg/dL (ref 8–27)
Bilirubin Total: 1 mg/dL (ref 0.0–1.2)
CO2: 24 mmol/L (ref 20–29)
Calcium: 9.6 mg/dL (ref 8.6–10.2)
Chloride: 103 mmol/L (ref 96–106)
Creatinine, Ser: 0.99 mg/dL (ref 0.76–1.27)
Globulin, Total: 3.1 g/dL (ref 1.5–4.5)
Glucose: 118 mg/dL — AB (ref 70–99)
Potassium: 4 mmol/L (ref 3.5–5.2)
Sodium: 144 mmol/L (ref 134–144)
Total Protein: 7.3 g/dL (ref 6.0–8.5)
eGFR: 80 mL/min/1.73 (ref >59)

## 2024-05-21 LAB — CBC WITH DIFFERENTIAL/PLATELET
Basophils Absolute: 0.1 x10E3/uL (ref 0.0–0.2)
Basos: 1 %
EOS (ABSOLUTE): 0.1 x10E3/uL (ref 0.0–0.4)
Eos: 1 %
Hematocrit: 48.1 % (ref 37.5–51.0)
Hemoglobin: 15.6 g/dL (ref 13.0–17.7)
Immature Grans (Abs): 0 x10E3/uL (ref 0.0–0.1)
Immature Granulocytes: 0 %
Lymphocytes Absolute: 1.6 x10E3/uL (ref 0.7–3.1)
Lymphs: 26 %
MCH: 30.3 pg (ref 26.6–33.0)
MCHC: 32.4 g/dL (ref 31.5–35.7)
MCV: 93 fL (ref 79–97)
Monocytes Absolute: 0.5 x10E3/uL (ref 0.1–0.9)
Monocytes: 8 %
Neutrophils Absolute: 3.9 x10E3/uL (ref 1.4–7.0)
Neutrophils: 64 %
Platelets: 180 x10E3/uL (ref 150–450)
RBC: 5.15 x10E6/uL (ref 4.14–5.80)
RDW: 13.2 % (ref 11.6–15.4)
WBC: 6 x10E3/uL (ref 3.4–10.8)

## 2024-05-24 ENCOUNTER — Ambulatory Visit: Payer: Self-pay | Admitting: Family Medicine

## 2024-05-24 NOTE — Progress Notes (Signed)
Hello [name],  Your lab result is normal and/or stable.Some minor variations that are not significant are commonly marked abnormal, but do not represent any medical problem for you.  Best regards, Mechele Claude, M.D.

## 2024-06-23 DIAGNOSIS — X32XXXD Exposure to sunlight, subsequent encounter: Secondary | ICD-10-CM | POA: Diagnosis not present

## 2024-06-23 DIAGNOSIS — Z08 Encounter for follow-up examination after completed treatment for malignant neoplasm: Secondary | ICD-10-CM | POA: Diagnosis not present

## 2024-06-23 DIAGNOSIS — L570 Actinic keratosis: Secondary | ICD-10-CM | POA: Diagnosis not present

## 2024-06-23 DIAGNOSIS — Z85828 Personal history of other malignant neoplasm of skin: Secondary | ICD-10-CM | POA: Diagnosis not present

## 2024-06-28 ENCOUNTER — Other Ambulatory Visit: Payer: Self-pay | Admitting: Family Medicine

## 2024-06-28 DIAGNOSIS — E1159 Type 2 diabetes mellitus with other circulatory complications: Secondary | ICD-10-CM

## 2024-06-28 NOTE — Telephone Encounter (Signed)
 Pt will call back to schedule appt.

## 2024-06-28 NOTE — Telephone Encounter (Signed)
 Stacks NTBS in Nov for 3 mos FU RF sent to pharmacy

## 2024-10-11 ENCOUNTER — Other Ambulatory Visit: Payer: Self-pay | Admitting: Family Medicine

## 2024-10-11 DIAGNOSIS — E1159 Type 2 diabetes mellitus with other circulatory complications: Secondary | ICD-10-CM

## 2024-10-11 DIAGNOSIS — I152 Hypertension secondary to endocrine disorders: Secondary | ICD-10-CM

## 2024-11-03 ENCOUNTER — Ambulatory Visit: Admitting: Family Medicine

## 2024-11-03 ENCOUNTER — Encounter: Payer: Self-pay | Admitting: Family Medicine

## 2024-11-03 VITALS — BP 122/74 | HR 65 | Temp 97.5°F | Ht 72.0 in | Wt 220.5 lb

## 2024-11-03 DIAGNOSIS — I10 Essential (primary) hypertension: Secondary | ICD-10-CM

## 2024-11-03 DIAGNOSIS — E782 Mixed hyperlipidemia: Secondary | ICD-10-CM

## 2024-11-03 DIAGNOSIS — E118 Type 2 diabetes mellitus with unspecified complications: Secondary | ICD-10-CM

## 2024-11-03 LAB — BAYER DCA HB A1C WAIVED: HB A1C (BAYER DCA - WAIVED): 6.9 % — ABNORMAL HIGH (ref 4.8–5.6)

## 2024-11-03 LAB — LIPID PANEL

## 2024-11-03 NOTE — Progress Notes (Signed)
 "  Subjective:  Patient ID: [name], male    DOB: 27-Sep-1951  Age: 74 y.o. MRN: [id number]  CC: Medical Management of Chronic Issues   HPI  Discussed the use of AI scribe software for clinical note transcription with the patient, who gave verbal consent to proceed.  [name] of Present Illness [name] is a 74 year old male who presents with diabetes management and recent fall on ice.  He experienced a fall on ice, after which he developed swelling and a 'good sized goose egg' on his right hip. Initially, there was significant swelling described as a 'good sized goose egg,' but the swelling has decreased significantly over the past few days. Pain has improved and does not bother him much, allowing him to get around without difficulty.  He is managing his diabetes with Ozempic , which he takes regularly. Blood sugar levels are highest in the morning, sometimes reaching the 130s to 140s, but improve to the 120s to 130s later in the day. He monitors his blood pressure, which typically ranges from 120 to 130 over 70 to 80.  He experiences constipation, which he attributes to his medication. He has started using a stool softener and Miralax to manage this, noting that the stool softener helps but sometimes results in diarrhea.          11/03/2024    3:19 PM 05/20/2024    1:13 PM 05/20/2024   12:58 PM  Depression screen PHQ 2/9  Decreased Interest 0 0 0  Down, Depressed, Hopeless 0 0 0  PHQ - 2 Score 0 0 0  Altered sleeping 0 0   Tired, decreased energy 0 0   Change in appetite 0 0   Feeling bad or failure about yourself  0 0   Trouble concentrating 0 0   Moving slowly or fidgety/restless 0 0   Suicidal thoughts 0 0   PHQ-9 Score 0 0    Difficult doing work/chores Not difficult at all Not difficult at all      Data saved with a previous flowsheet row definition    [name] [name] of Arthritis, Atypical small acinar proliferation of prostate, Benign  paroxysmal positional vertigo, Diabetes mellitus without complication (HCC), Elevated PSA, Essential hypertension, benign, GERD (gastroesophageal reflux disease), [name] of basal cell carcinoma excision, [name] of hiatal hernia, [name] of kidney stones, [name] of melanoma excision, [name] of small bowel obstruction, Hyperlipidemia, LAFB (left anterior fascicular block), and Prediabetes.   He has a past surgical [name] that includes Lumbar disc surgery (x3   last one 1980's); Gastric fundoplication (1970's); and Prostate biopsy (N/A, 11/21/2015).   His family [name] includes Diabetes in his father; Hypertension in his father and mother; Melanoma in his mother.He reports that he has never smoked. He has never used smokeless tobacco. He reports current alcohol use. He reports that he does not use drugs.    ROS Review of Systems  Constitutional: Negative.   HENT: Negative.    Eyes:  Negative for visual disturbance.  Respiratory:  Negative for cough and shortness of breath.   Cardiovascular:  Negative for chest pain and leg swelling.  Gastrointestinal:  Negative for abdominal pain, diarrhea, nausea and vomiting.  Genitourinary:  Negative for difficulty urinating.  Musculoskeletal:  Negative for arthralgias and myalgias.  Skin:  Negative for rash.  Neurological:  Negative for headaches.  Psychiatric/Behavioral:  Negative for sleep disturbance.     Objective:  BP 122/74   Pulse 65   Temp (!)  97.5 F (36.4 C)   Ht 6' (1.829 m)   Wt 220 lb 8 oz (100 kg)   SpO2 97%   BMI 29.91 kg/m   BP Readings from Last 3 Encounters:  11/03/24 122/74  05/20/24 135/75  01/01/24 137/77    Wt Readings from Last 3 Encounters:  11/03/24 220 lb 8 oz (100 kg)  05/20/24 216 lb 9.6 oz (98.2 kg)  01/01/24 216 lb (98 kg)     Physical Exam Vitals reviewed.  Constitutional:      Appearance: He is well-developed.  HENT:     Head: Normocephalic and atraumatic.     Right Ear: External ear normal.      Left Ear: External ear normal.     Mouth/Throat:     Pharynx: No oropharyngeal exudate or posterior oropharyngeal erythema.  Eyes:     Pupils: Pupils are equal, round, and reactive to light.  Cardiovascular:     Rate and Rhythm: Normal rate and regular rhythm.     Heart sounds: No murmur heard. Pulmonary:     Effort: No respiratory distress.     Breath sounds: Normal breath sounds.  Musculoskeletal:     Cervical back: Normal range of motion and neck supple.  Neurological:     Mental Status: He is alert and oriented to person, place, and time.    Physical Exam GENERAL: Alert, cooperative, well developed, no acute distress. HEENT: Normocephalic, normal oropharynx, moist mucous membranes. CHEST: Clear to auscultation bilaterally, no wheezes, rhonchi, or crackles. CARDIOVASCULAR: Normal heart rate and rhythm, S1 and S2 normal without murmurs. ABDOMEN: Soft, non-tender, non-distended, without organomegaly, normal bowel sounds. EXTREMITIES: No cyanosis or edema. NEUROLOGICAL: Cranial nerves grossly intact, moves all extremities without gross motor or sensory deficit.   Assessment & Plan:  Mixed hyperlipidemia -     Lipid panel  Essential hypertension, benign -     CMP14+EGFR  Controlled type 2 diabetes mellitus with complication, without long-term current use of insulin (HCC) -     Bayer DCA Hb A1c Waived -     CBC with Differential/Platelet    Assessment and Plan Assessment & Plan Superficial injury of right hip   The right hip injury from a fall on ice has significantly improved, with initial swelling and bruising resolved. He experiences no significant pain or functional limitation, and there are no signs of fracture as pain has not worsened with use.  Type 2 diabetes mellitus with complication   Type 2 diabetes is managed with Ozempic . Morning hyperglycemia is likely due to medication timing. A1c is slightly under 7, indicating good control. Constipation is a side  effect of Ozempic . Continue Ozempic  as prescribed and monitor blood glucose levels, especially in the morning. Use stool softeners daily to manage constipation and consider Miralax if stool softeners are insufficient. Bring blood glucose log to the next appointment.  Constipation   Constipation is likely secondary to Ozempic  use. Current management includes stool softeners and occasional Miralax. Emphasize proactive management to prevent severe constipation. Use stool softeners daily, adjusting the dose as needed. Add Miralax if stool softeners are insufficient and consider Linzess if over-the-counter options fail.  Essential hypertension   Blood pressure readings are generally well-controlled, ranging from 120-130/70-80 mmHg. Continue the current antihypertensive regimen.  Mixed hyperlipidemia   No specific discussion of lipid levels or management in this encounter.       Follow-up: Return in about 6 months (around 05/03/2025) for [facility] physical.  Butler Der, M.D. "

## 2024-11-04 LAB — LIPID PANEL
Cholesterol, Total: 124 mg/dL (ref 100–199)
HDL: 43 mg/dL
LDL CALC COMMENT:: 2.9 ratio (ref 0.0–5.0)
LDL Chol Calc (NIH): 63 mg/dL (ref 0–99)
Triglycerides: 98 mg/dL (ref 0–149)
VLDL Cholesterol Cal: 18 mg/dL (ref 5–40)

## 2024-11-04 LAB — CBC WITH DIFFERENTIAL/PLATELET
Basophils Absolute: 0.1 10*3/uL (ref 0.0–0.2)
Basos: 1 %
EOS (ABSOLUTE): 0.1 10*3/uL (ref 0.0–0.4)
Eos: 1 %
Hematocrit: 47.9 % (ref 37.5–51.0)
Hemoglobin: 15.7 g/dL (ref 13.0–17.7)
Immature Grans (Abs): 0 10*3/uL (ref 0.0–0.1)
Immature Granulocytes: 0 %
Lymphocytes Absolute: 1.9 10*3/uL (ref 0.7–3.1)
Lymphs: 26 %
MCH: 30.4 pg (ref 26.6–33.0)
MCHC: 32.8 g/dL (ref 31.5–35.7)
MCV: 93 fL (ref 79–97)
Monocytes Absolute: 0.5 10*3/uL (ref 0.1–0.9)
Monocytes: 7 %
Neutrophils Absolute: 4.7 10*3/uL (ref 1.4–7.0)
Neutrophils: 65 %
Platelets: 212 10*3/uL (ref 150–450)
RBC: 5.16 x10E6/uL (ref 4.14–5.80)
RDW: 13.3 % (ref 11.6–15.4)
WBC: 7.3 10*3/uL (ref 3.4–10.8)

## 2024-11-04 LAB — CMP14+EGFR
ALT: 15 [IU]/L (ref 0–44)
AST: 15 [IU]/L (ref 0–40)
Albumin: 4.5 g/dL (ref 3.8–4.8)
Alkaline Phosphatase: 129 [IU]/L — AB (ref 47–123)
BUN/Creatinine Ratio: 22 (ref 10–24)
BUN: 20 mg/dL (ref 8–27)
Bilirubin Total: 0.8 mg/dL (ref 0.0–1.2)
CO2: 23 mmol/L (ref 20–29)
Calcium: 9.7 mg/dL (ref 8.6–10.2)
Chloride: 102 mmol/L (ref 96–106)
Creatinine, Ser: 0.9 mg/dL (ref 0.76–1.27)
Globulin, Total: 2.7 g/dL (ref 1.5–4.5)
Glucose: 108 mg/dL — AB (ref 70–99)
Potassium: 4.5 mmol/L (ref 3.5–5.2)
Sodium: 140 mmol/L (ref 134–144)
Total Protein: 7.2 g/dL (ref 6.0–8.5)
eGFR: 90 mL/min/{1.73_m2}

## 2025-05-04 ENCOUNTER — Encounter: Admitting: Family Medicine
